# Patient Record
Sex: Male | Born: 1950 | Race: White | Hispanic: No | Marital: Married | Smoking: Never smoker
Health system: Southern US, Community
[De-identification: ages and names within clinical notes are randomized; demographics above are authoritative.]

## PROBLEM LIST (undated history)

## (undated) DIAGNOSIS — T148XXA Other injury of unspecified body region, initial encounter: Secondary | ICD-10-CM

## (undated) DIAGNOSIS — E785 Hyperlipidemia, unspecified: Secondary | ICD-10-CM

## (undated) DIAGNOSIS — T7840XA Allergy, unspecified, initial encounter: Secondary | ICD-10-CM

## (undated) DIAGNOSIS — G250 Essential tremor: Secondary | ICD-10-CM

## (undated) DIAGNOSIS — M1990 Unspecified osteoarthritis, unspecified site: Secondary | ICD-10-CM

## (undated) DIAGNOSIS — I10 Essential (primary) hypertension: Secondary | ICD-10-CM

## (undated) DIAGNOSIS — Z1210 Encounter for screening for malignant neoplasm of intestinal tract, unspecified: Secondary | ICD-10-CM

## (undated) DIAGNOSIS — K219 Gastro-esophageal reflux disease without esophagitis: Secondary | ICD-10-CM

## (undated) DIAGNOSIS — E119 Type 2 diabetes mellitus without complications: Secondary | ICD-10-CM

## (undated) HISTORY — DX: Gastro-esophageal reflux disease without esophagitis: K21.9

## (undated) HISTORY — DX: Encounter for screening for malignant neoplasm of intestinal tract, unspecified: Z12.10

## (undated) HISTORY — DX: Hyperlipidemia, unspecified: E78.5

## (undated) HISTORY — DX: Allergy, unspecified, initial encounter: T78.40XA

## (undated) HISTORY — DX: Unspecified osteoarthritis, unspecified site: M19.90

## (undated) HISTORY — DX: Other injury of unspecified body region, initial encounter: T14.8XXA

## (undated) HISTORY — DX: Type 2 diabetes mellitus without complications: E11.9

## (undated) HISTORY — DX: Essential tremor: G25.0

## (undated) HISTORY — DX: Essential (primary) hypertension: I10

---

## 1981-12-29 HISTORY — PX: THYROID CYST EXCISION: SHX2511

## 2005-05-23 ENCOUNTER — Ambulatory Visit: Payer: Self-pay | Admitting: Internal Medicine

## 2005-06-09 ENCOUNTER — Ambulatory Visit: Payer: Self-pay | Admitting: Cardiology

## 2005-07-07 ENCOUNTER — Ambulatory Visit: Payer: Self-pay | Admitting: Internal Medicine

## 2005-07-14 ENCOUNTER — Ambulatory Visit: Payer: Self-pay | Admitting: Internal Medicine

## 2005-09-29 ENCOUNTER — Ambulatory Visit: Payer: Self-pay | Admitting: Internal Medicine

## 2005-10-03 ENCOUNTER — Ambulatory Visit: Payer: Self-pay | Admitting: Internal Medicine

## 2005-11-07 ENCOUNTER — Ambulatory Visit: Payer: Self-pay | Admitting: Internal Medicine

## 2006-01-01 ENCOUNTER — Ambulatory Visit: Payer: Self-pay | Admitting: Internal Medicine

## 2006-02-19 ENCOUNTER — Ambulatory Visit: Payer: Self-pay | Admitting: Internal Medicine

## 2006-12-16 ENCOUNTER — Ambulatory Visit: Payer: Self-pay | Admitting: Internal Medicine

## 2007-01-06 ENCOUNTER — Ambulatory Visit: Payer: Self-pay | Admitting: Internal Medicine

## 2007-01-06 LAB — CONVERTED CEMR LAB
Albumin: 3.3 g/dL — ABNORMAL LOW (ref 3.5–5.2)
CO2: 29 meq/L (ref 19–32)
Cholesterol: 144 mg/dL (ref 0–200)
GFR calc non Af Amer: 73 mL/min
Glomerular Filtration Rate, Af Am: 89 mL/min/{1.73_m2}
Glucose, Bld: 0 mg/dL — CL (ref 70–99)
HDL: 30.4 mg/dL — ABNORMAL LOW (ref >39.0)
LDL Cholesterol: 84 mg/dL (ref 0–99)
Total Bilirubin: 0.9 mg/dL (ref 0.3–1.2)
Triglyceride fasting, serum: 146 mg/dL (ref 0–149)
VLDL: 29 mg/dL (ref 0–40)

## 2007-01-07 ENCOUNTER — Encounter: Payer: Self-pay | Admitting: Internal Medicine

## 2007-01-07 LAB — CONVERTED CEMR LAB: Glucose, Bld: 157 mg/dL — ABNORMAL HIGH (ref 70–99)

## 2007-01-13 ENCOUNTER — Ambulatory Visit: Payer: Self-pay | Admitting: Internal Medicine

## 2007-04-14 ENCOUNTER — Ambulatory Visit: Payer: Self-pay | Admitting: Internal Medicine

## 2007-04-14 LAB — CONVERTED CEMR LAB
Hgb A1c MFr Bld: 7.1 % — ABNORMAL HIGH (ref 4.6–6.0)
LDL Cholesterol: 105 mg/dL — ABNORMAL HIGH (ref 0–99)
Total CHOL/HDL Ratio: 5.2
VLDL: 23 mg/dL (ref 0–40)

## 2007-04-28 ENCOUNTER — Ambulatory Visit: Payer: Self-pay | Admitting: Internal Medicine

## 2007-09-27 ENCOUNTER — Encounter: Payer: Self-pay | Admitting: *Deleted

## 2007-09-27 DIAGNOSIS — E118 Type 2 diabetes mellitus with unspecified complications: Secondary | ICD-10-CM | POA: Insufficient documentation

## 2007-09-27 DIAGNOSIS — I10 Essential (primary) hypertension: Secondary | ICD-10-CM | POA: Insufficient documentation

## 2008-02-18 ENCOUNTER — Telehealth (INDEPENDENT_AMBULATORY_CARE_PROVIDER_SITE_OTHER): Payer: Self-pay | Admitting: *Deleted

## 2008-03-06 ENCOUNTER — Ambulatory Visit: Payer: Self-pay | Admitting: Internal Medicine

## 2008-03-06 ENCOUNTER — Encounter (INDEPENDENT_AMBULATORY_CARE_PROVIDER_SITE_OTHER): Payer: Self-pay | Admitting: *Deleted

## 2008-03-06 DIAGNOSIS — E1169 Type 2 diabetes mellitus with other specified complication: Secondary | ICD-10-CM | POA: Insufficient documentation

## 2008-03-06 DIAGNOSIS — E785 Hyperlipidemia, unspecified: Secondary | ICD-10-CM

## 2008-03-06 LAB — CONVERTED CEMR LAB
ALT: 27 units/L (ref 0–53)
AST: 18 units/L (ref 0–37)
Albumin: 3.6 g/dL (ref 3.5–5.2)
Bilirubin, Direct: 0.1 mg/dL (ref 0.0–0.3)
Calcium: 9.7 mg/dL (ref 8.4–10.5)
Chloride: 104 meq/L (ref 96–112)
GFR calc Af Amer: 99 mL/min
GFR calc non Af Amer: 82 mL/min
Glucose, Bld: 182 mg/dL — ABNORMAL HIGH (ref 70–99)
HDL: 26.2 mg/dL — ABNORMAL LOW (ref >39.0)
Total CHOL/HDL Ratio: 5.4
VLDL: 47 mg/dL — ABNORMAL HIGH (ref 0–40)

## 2008-03-08 ENCOUNTER — Encounter: Payer: Self-pay | Admitting: Internal Medicine

## 2008-03-08 DIAGNOSIS — J309 Allergic rhinitis, unspecified: Secondary | ICD-10-CM | POA: Insufficient documentation

## 2008-03-24 ENCOUNTER — Ambulatory Visit: Payer: Self-pay | Admitting: Gastroenterology

## 2008-04-24 ENCOUNTER — Ambulatory Visit: Payer: Self-pay | Admitting: Gastroenterology

## 2008-04-24 ENCOUNTER — Encounter: Payer: Self-pay | Admitting: Gastroenterology

## 2008-04-24 ENCOUNTER — Encounter: Payer: Self-pay | Admitting: Internal Medicine

## 2008-04-25 ENCOUNTER — Encounter: Payer: Self-pay | Admitting: Gastroenterology

## 2008-07-27 ENCOUNTER — Telehealth: Payer: Self-pay | Admitting: Internal Medicine

## 2008-08-15 ENCOUNTER — Encounter: Payer: Self-pay | Admitting: Internal Medicine

## 2009-05-14 ENCOUNTER — Ambulatory Visit: Payer: Self-pay | Admitting: Gastroenterology

## 2009-06-01 ENCOUNTER — Ambulatory Visit: Payer: Self-pay | Admitting: Gastroenterology

## 2009-07-30 ENCOUNTER — Telehealth: Payer: Self-pay | Admitting: Internal Medicine

## 2009-10-16 ENCOUNTER — Telehealth: Payer: Self-pay | Admitting: Internal Medicine

## 2009-11-21 ENCOUNTER — Telehealth: Payer: Self-pay | Admitting: Internal Medicine

## 2009-12-25 ENCOUNTER — Telehealth: Payer: Self-pay | Admitting: Internal Medicine

## 2010-01-01 ENCOUNTER — Telehealth: Payer: Self-pay | Admitting: Internal Medicine

## 2010-01-09 ENCOUNTER — Telehealth: Payer: Self-pay | Admitting: Internal Medicine

## 2010-02-12 ENCOUNTER — Telehealth: Payer: Self-pay | Admitting: Internal Medicine

## 2010-03-21 ENCOUNTER — Ambulatory Visit: Payer: Self-pay | Admitting: Internal Medicine

## 2010-03-21 LAB — CONVERTED CEMR LAB
Albumin: 3.6 g/dL (ref 3.5–5.2)
Alkaline Phosphatase: 79 units/L (ref 39–117)
Basophils Absolute: 0 10*3/uL (ref 0.0–0.1)
Basophils Relative: 0.4 % (ref 0.0–3.0)
Bilirubin Urine: NEGATIVE
Calcium: 9.4 mg/dL (ref 8.4–10.5)
Chloride: 103 meq/L (ref 96–112)
Eosinophils Absolute: 0.3 10*3/uL (ref 0.0–0.7)
HCT: 43.3 % (ref 39.0–52.0)
Hemoglobin: 14 g/dL (ref 13.0–17.0)
Ketones, ur: NEGATIVE mg/dL
LDL Cholesterol: 85 mg/dL (ref 0–99)
Leukocytes, UA: NEGATIVE
Lymphocytes Relative: 22.6 % (ref 12.0–46.0)
Lymphs Abs: 1.4 10*3/uL (ref 0.7–4.0)
Monocytes Relative: 6.9 % (ref 3.0–12.0)
Neutro Abs: 3.9 10*3/uL (ref 1.4–7.7)
Neutrophils Relative %: 64.8 % (ref 43.0–77.0)
Nitrite: NEGATIVE
Platelets: 152 10*3/uL (ref 150.0–400.0)
RBC: 5.22 M/uL (ref 4.22–5.81)
TSH: 2.48 microintl units/mL (ref 0.35–5.50)
Total Bilirubin: 0.6 mg/dL (ref 0.3–1.2)

## 2010-03-26 ENCOUNTER — Ambulatory Visit: Payer: Self-pay | Admitting: Internal Medicine

## 2010-03-26 LAB — HM DIABETES FOOT EXAM

## 2010-04-09 ENCOUNTER — Encounter: Payer: Self-pay | Admitting: Internal Medicine

## 2010-04-11 ENCOUNTER — Telehealth: Payer: Self-pay | Admitting: Internal Medicine

## 2010-09-18 ENCOUNTER — Telehealth (INDEPENDENT_AMBULATORY_CARE_PROVIDER_SITE_OTHER): Payer: Self-pay

## 2010-09-24 ENCOUNTER — Telehealth (INDEPENDENT_AMBULATORY_CARE_PROVIDER_SITE_OTHER): Payer: Self-pay | Admitting: *Deleted

## 2011-01-30 NOTE — Progress Notes (Signed)
Summary: REFILLS  Phone Note Call from Patient Call back at Home Phone [phone]   Summary of Call: Patient left message on triage that he was notified by his pharmacy that our office wil not refill his meds. Patient would like a call to discuss and schedule CPX.  *Note- Crestor was refilled. Initial call taken by: Lucious Groves,  February 12, 2010 11:15 AM  Follow-up for Phone Call        Spoke with pt, last office visit was 02/2008, sent in refills and transferred pt to schedulers for cpx  Follow-up by: Lamar Sprinkles, CMA,  February 12, 2010 11:49 AM    Prescriptions: CRESTOR 5 MG  TABS (ROSUVASTATIN CALCIUM) once daily  #30 x 1   Entered by:   Lamar Sprinkles, CMA   Authorized by:   [name]   Signed by:   Lamar Sprinkles, CMA on 02/12/2010   Method used:   Electronically to        [facility] [phone][facility] (retail)       [address]       [place], [place]  [address]       Ph: [phone] or [phone]       Fax: [phone]   RxID:   [id number] METFORMIN HCL 500 MG  TABS (METFORMIN HCL) two times a day  #60 x 1   Entered by:   Lamar Sprinkles, CMA   Authorized by:   [name]   Signed by:   Lamar Sprinkles, CMA on 02/12/2010   Method used:   Electronically to        [facility] [phone]* (retail)       [address]       [place], [place]  [address]       Ph: [phone] or [phone]       Fax: [phone]   RxID:   [id number]

## 2011-01-30 NOTE — Progress Notes (Signed)
  Phone Note Refill Request Message from:  Fax from Pharmacy on April 11, 2010 10:20 AM  Refills Requested: Medication #1:  CRESTOR 5 MG  TABS once daily   Last Refilled: 03/12/2010 Initial call taken by: Ami Bullins CMA,  April 11, 2010 10:20 AM    Prescriptions: CRESTOR 5 MG  TABS (ROSUVASTATIN CALCIUM) once daily  #30 x 3   Entered by:   Ami Bullins CMA   Authorized by:   [name]   Signed by:   [name] CMA on 04/11/2010   Method used:   Electronically to        [facility] [phone]* (retail)       [address]       [place], [place]  [address]       Ph: [phone] or [phone]       Fax: [phone]   RxID:   [id number]

## 2011-01-30 NOTE — Progress Notes (Signed)
  Phone Note Other Incoming   Request: Send information Summary of Call: Request for records received from [facility]. Request forwarded to [facility].

## 2011-01-30 NOTE — Progress Notes (Signed)
  Phone Note Other Incoming   Request: Send information Summary of Call: Request received from [facility]. Forwarded to [facility].    

## 2011-01-30 NOTE — Letter (Signed)
Summary: Ashok Cordia OD  Ashok Cordia OD   Imported By: Sherian Rein 04/25/2010 10:41:46  _____________________________________________________________________  External Attachment:    Type:   Image     Comment:   External Document

## 2011-01-30 NOTE — Assessment & Plan Note (Signed)
Summary: PHYSICAL--STC   Vital Signs:  Patient profile:   60 year old male Height:      69 inches (175.26 cm) Weight:      193 pounds (87.73 kg) BMI:     28.60 O2 Sat:      98 % on Room air Temp:     98.3 degrees F oral Pulse rate:   71 / minute BP sitting:   132 / 88  (left arm) Cuff size:   regular  Vitals Entered By: [name] (2010/04/16 2:35 PM)  O2 Flow:  Room air CC: CPX/aj  Vision Screening:      Vision Comments: 08/2009 with Dr. [name]  Vision Entered By: [name] CMA (Apr 16, 2010 2:39 PM) [name]   Primary Care Provider:  [name]  CC:  CPX/aj.  History of Present Illness: Patient presents for CPX. Last exam in Feb '09. IN the interval he has had colonoscopy with a large adenomatous polyp.  He had a repeat in June '10 which was negative.   He has not had any monitoring of his  blood sugar except at colonoscopy the CBG was elevated. He has not felt like he has had any lows but he does complain of low energy and lack of zest which may be due to chronic hyperglycemia  In the interval he has not had any major illness, surgery or injury.   He does complain of left foot pain.  Current Medications (verified): 1)  Zestril 20 Mg  Tabs (Lisinopril) .... Once Daily 2)  Crestor 5 Mg  Tabs (Rosuvastatin Calcium) .... Once Daily 3)  Metformin Hcl 500 Mg  Tabs (Metformin Hcl) .... Two Times A Day  Allergies (verified): No Known Drug Allergies  Past History:  Past Medical History: Last updated: 03/06/2008  OTHER AND UNSPECIFIED HYPERLIPIDEMIA (ICD-272.4) SPECIAL SCREENING MALIG NEOPLSM INTESTINE UNSPEC (ICD-V76.50) HYPERTENSION (ICD-401.9) DIABETES MELLITUS, TYPE II (ICD-250.00) Allergic rhinitis FAMILIAL TREMOR CALCIUM DEPOSITS BILATERAL SHOULDERS FRACTURE RIGHT THUMB, n/o FRACTURE RIGHT ELBOW,h/o  Family History: Last updated: 04-16-10 father - deceased @ 66: CAD/MI-fatal, familial tremor, DM mother  -deceased @ 23 breast  cancer 1/2 sister with breast cancer, died of CAD/MI 1/2 brother with CAD/CABG Neg- colon, prostate cancer;   Social History: Last updated: 03/06/2008 [place] - '89 twins/boy & girl - '92 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  Risk Factors: Smoking Status: never (09/27/2007)  Past Surgical History: Thyroid ductal cyst removal '83  Family History: father - deceased @ 42: CAD/MI-fatal, familial tremor, DM mother  -deceased @ 32 breast cancer 1/2 sister with breast cancer, died of CAD/MI 1/2 brother with CAD/CABG Neg- colon, prostate cancer;   Review of Systems  The patient denies anorexia, fever, weight loss, weight gain, vision loss, hoarseness, chest pain, dyspnea on exertion, peripheral edema, prolonged cough, hemoptysis, abdominal pain, hematochezia, hematuria, muscle weakness, suspicious skin lesions, difficulty walking, unusual weight change, abnormal bleeding, and angioedema.    Physical Exam  General:  Well-developed,well-nourished,in no acute distress; alert,appropriate and cooperative throughout examination Head:  Normocephalic and atraumatic without obvious abnormalities. No apparent alopecia or balding. Eyes:  pupils equal and pupils round.   Ears:  R ear normal and L ear normal.   Nose:  no external deformity and no external erythema.   Mouth:  Oral mucosa and oropharynx without lesions or exudates.  Teeth in good repair. Neck:  No deformities, masses, or tenderness noted. Chest Wall:  no deformities and no tenderness.   Lungs:  Normal respiratory effort, chest expands symmetrically. Lungs are clear to auscultation, no crackles or wheezes. Heart:  Normal rate and regular rhythm. S1 and S2 normal without gallop, murmur, click, rub or other extra sounds. Abdomen:  soft, normal bowel sounds, no distention, no masses, no guarding, no inguinal hernia, and no hepatomegaly.   Rectal:  No external abnormalities noted. Normal sphincter tone. No  rectal masses or tenderness. Genitalia:  Testes bilaterally descended without nodularity, tenderness or masses. No scrotal masses or lesions. No penis lesions or urethral discharge. Prostate:  Prostate gland firm and smooth, no enlargement, nodularity, tenderness, mass, asymmetry or induration. Msk:  normal ROM, no joint tenderness, no joint swelling, no joint warmth, no joint deformities, and no joint instability.   Pulses:  2+ radial and DP pusles Extremities:  No clubbing, cyanosis, edema, or deformity noted with normal full range of motion of all joints.   Neurologic:  alert & oriented X3, cranial nerves II-XII intact, gait normal, and DTRs symmetrical and normal.   Skin:  turgor normal, color normal, no ulcerations, and no edema.   Cervical Nodes:  no anterior cervical adenopathy and no posterior cervical adenopathy.   Psych:  Oriented X3, memory intact for recent and remote, normally interactive, and good eye contact.    Diabetes Management Exam:    Foot Exam (with socks and/or shoes not present):       Sensory-Pinprick/Light touch:          Left medial foot (L-4): normal          Left dorsal foot (L-5): normal          Left lateral foot (S-1): normal          Right medial foot (L-4): normal          Right dorsal foot (L-5): normal          Right lateral foot (S-1): normal       Sensory-Monofilament:          Left foot: normal          Right foot: normal       Sensory-other: decreased deep vibratory sensation       Inspection:          Left foot: normal          Right foot: normal       Nails:          Left foot: normal          Right foot: normal    Eye Exam:       Eye Exam done elsewhere          Date: 09/13/2008          Results: normal          Done by: september '09   Impression & Recommendations:  Problem # 1:  ALLERGIC RHINITIS (ICD-477.9) Having some symptoms as the season progresses.  Plan - continue allegra (soon to go otc)  The following medications were  removed from the medication list:    Allegra 180 Mg Tabs (Fexofenadine hcl) .Marland Kitchen... As needed  Problem # 2:  OTHER AND UNSPECIFIED HYPERLIPIDEMIA (ICD-272.4)  His updated medication list for this problem includes:    Crestor 5 Mg Tabs (Rosuvastatin calcium) ..... Once daily  Labs Reviewed: SGOT: 18 (03/21/2010)   SGPT: 32 (03/21/2010)   HDL:36.10 (03/21/2010), 26.2 (03/06/2008)  LDL:85 (03/21/2010), DEL (72/53/6644)  Chol:156 (03/21/2010), 141 (03/06/2008)  Trig:176.0 (03/21/2010), 233 (03/06/2008)  Excellent control on crestor  5mg  once daily. Plan - continue present medication  Problem # 3:  HYPERTENSION (ICD-401.9)  His updated medication list for this problem includes:    Zestril 20 Mg Tabs (Lisinopril) ..... Once daily  BP today: 132/88 Prior BP: 137/89 (03/06/2008)  Labs Reviewed: K+: 4.3 (03/21/2010)  Borderline control. Plan - continue present medications. Better life-style managment with exericse and weight loss  Problem # 4:  DIABETES MELLITUS, TYPE II (ICD-250.00) A1C 9.3% - poor control.  Plan - increase metformin to 100mg  two times a day           close attention to no sugar and low carb diet. Aerobic exercise at least 3 times a week.           Repeat A1C in 3 months  His updated medication list for this problem includes:    Zestril 20 Mg Tabs (Lisinopril) ..... Once daily    Metformin Hcl 1000 Mg Tabs (Metformin hcl) .Marland Kitchen... 1 by mouth two times a day  Problem # 5:  Preventive Health Care (ICD-V70.0) Unremarkable history, Exam is essentially normal except for minimal loss of deep vibratory sensation feet. Up to date colorectal and prostate cancer screening. Immunizations up to date.  In summary - a nice man who has poorly controlled diabetes. He needs to be seen every 6 months and will need a repeat A1C in 3 months.   Complete Medication List: 1)  Zestril 20 Mg Tabs (Lisinopril) .... Once daily 2)  Crestor 5 Mg Tabs (Rosuvastatin calcium) .... Once daily 3)   Metformin Hcl 1000 Mg Tabs (Metformin hcl) .Marland Kitchen.. 1 by mouth two times a day  Patient: Nathan Frazier Note: All result statuses are Final unless otherwise noted.  Tests: (1) BMP (METABOL)   Sodium                    140 mEq/L                   135-145   Potassium                 4.3 mEq/L                   3.5-5.1   Chloride                  103 mEq/L                   96-112   Carbon Dioxide            30 mEq/L                    19-32   Glucose              [H]  218 mg/dL                   16-10   BUN                       12 mg/dL                    9-60   Creatinine                0.9 mg/dL                   4.5-4.0   Calcium  9.4 mg/dL                   4.6-27.0   GFR                       91.18 mL/min                >60  Tests: (2) CBC Platelet w/Diff (CBCD)   White Cell Count          6.0 K/uL                    4.5-10.5   Red Cell Count            5.22 Mil/uL                 4.22-5.81   Hemoglobin                14.0 g/dL                   35.0-09.3   Hematocrit                43.3 %                      39.0-52.0   MCV                       83.0 fl                     78.0-100.0   MCHC                      32.3 g/dL                   81.8-29.9   RDW                  [H]  15.5 %                      11.5-14.6   Platelet Count            152.0 K/uL                  150.0-400.0   Neutrophil %              64.8 %                      43.0-77.0   Lymphocyte %              22.6 %                      12.0-46.0   Monocyte %                6.9 %                       3.0-12.0   Eosinophils%         [H]  5.3 %                       0.0-5.0   Basophils %               0.4 %  0.0-3.0   Neutrophill Absolute      3.9 K/uL                    1.4-7.7   Lymphocyte Absolute       1.4 K/uL                    0.7-4.0   Monocyte Absolute         0.4 K/uL                    0.1-1.0  Eosinophils, Absolute                             0.3 K/uL                     0.0-0.7   Basophils Absolute        0.0 K/uL                    0.0-0.1  Tests: (3) Hepatic/Liver Function Panel (HEPATIC)   Total Bilirubin           0.6 mg/dL                   0.4-5.4   Direct Bilirubin          0.1 mg/dL                   0.9-8.1   Alkaline Phosphatase      79 U/L                      39-117   AST                       18 U/L                      0-37   ALT                       32 U/L                      0-53   Total Protein             7.1 g/dL                    1.9-1.4   Albumin                   3.6 g/dL                    7.8-2.9  Tests: (4) TSH (TSH)   FastTSH                   2.48 uIU/mL                 0.35-5.50  Tests: (5) Lipid Panel (LIPID)   Cholesterol               156 mg/dL                   5-621     ATP III Classification            Desirable:  < 200 mg/dL  Borderline High:  200 - 239 mg/dL               High:  > = 240 mg/dL   Triglycerides        [H]  176.0 mg/dL                 0.4-540.9     Normal:  <150 mg/dL     Borderline High:  811 - 199 mg/dL   HDL                  [L]  91.47 mg/dL                 >82.95   VLDL Cholesterol          35.2 mg/dL                  6.2-13.0   LDL Cholesterol           85 mg/dL                    8-65  CHO/HDL Ratio:  CHD Risk                             4                    Men          Women     1/2 Average Risk     3.4          3.3     Average Risk          5.0          4.4     2X Average Risk          9.6          7.1     3X Average Risk          15.0          11.0                           Tests: (6) Prostate Specific Antigen (PSA)   PSA-Hyb                   1.58 ng/mL                  0.10-4.00  Tests: (7) UDip w/Micro (URINE)   Color                     YELLOW       RANGE:  Yellow;Lt. Yellow   Clarity                   CLEAR                       Clear   Specific Gravity          >=1.030                     1.000 - 1.030   Urine Ph                  5.0                          5.0-8.0   Protein  100                         Negative   Urine Glucose             250                         Negative   Ketones                   NEGATIVE                    Negative   Urine Bilirubin           NEGATIVE                    Negative   Blood                     TRACE-INTACT                Negative   Urobilinogen              0.2                         0.0 - 1.0   Leukocyte Esterace        NEGATIVE                    Negative   Nitrite                   NEGATIVE                    Negative   Tests: (1) Hemoglobin A1C (A1C)   Hemoglobin A1C       [H]  9.3 %                       4.6-6.5     Glycemic Control Guidelines for People with Diabetes:     Non Diabetic:  <6%     Goal of Therapy: <7%     Additional Action Suggested:  >8% Prescriptions: METFORMIN HCL 1000 MG TABS (METFORMIN HCL) 1 by mouth two times a day  #60 x 12   Entered and Authorized by:   [name]   Signed by:   [name] on 03/27/2010   Method used:   Electronically to        [facility] [phone]* (retail)       [address]       [place], [place]  [address]       Ph: [phone] or [phone]       Fax: [phone]   RxID:   [id number]

## 2011-01-30 NOTE — Progress Notes (Signed)
  Phone Note Refill Request Message from:  Fax from Pharmacy  Refills Requested: Medication #1:  CRESTOR 5 MG  TABS once daily Initial call taken by: Ami Bullins CMA,  January 01, 2010 10:45 AM    Prescriptions: CRESTOR 5 MG  TABS (ROSUVASTATIN CALCIUM) once daily  #30 Tablet x 0   Entered by:   Ami Bullins CMA   Authorized by:   [name]   Signed by:   [name] CMA on 01/01/2010   Method used:   Electronically to        [facility] [phone]* (retail)       [address]       [place], [place]  [address]       Ph: [phone] or [phone]       Fax: [phone]   RxID:   [id number]

## 2011-01-30 NOTE — Progress Notes (Signed)
  Phone Note Refill Request Message from:  Fax from Pharmacy on January 09, 2010 2:21 PM  Refills Requested: Medication #1:  METFORMIN HCL 500 MG  TABS two times a day Initial call taken by: Ami Bullins CMA,  January 09, 2010 2:21 PM    Prescriptions: METFORMIN HCL 500 MG  TABS (METFORMIN HCL) two times a day  #60 Tablet x 2   Entered by:   Ami Bullins CMA   Authorized by:   [name]   Signed by:   [name] CMA on 01/09/2010   Method used:   Electronically to        [facility] [phone]* (retail)       [address]       [place], [place]  [address]       Ph: [phone] or [phone]       Fax: [phone]   RxID:   [id number]

## 2011-04-03 ENCOUNTER — Other Ambulatory Visit: Payer: Self-pay | Admitting: Internal Medicine

## 2011-04-07 LAB — GLUCOSE, CAPILLARY
Glucose-Capillary: 163 mg/dL — ABNORMAL HIGH (ref 70–99)
Glucose-Capillary: 183 mg/dL — ABNORMAL HIGH (ref 70–99)

## 2011-04-16 ENCOUNTER — Other Ambulatory Visit: Payer: Self-pay | Admitting: Internal Medicine

## 2011-05-13 NOTE — Assessment & Plan Note (Signed)
[facility]                         GASTROENTEROLOGY OFFICE NOTE   NAME:Frazier, Nathan Frazier                       MRN:          [id number]  DATE:03/24/2008                            DOB:          1951-07-12    REFERRING PHYSICIAN:  Rosalyn Gess. Norins, MD   REASON FOR REFERRAL:  Dr. Debby Bud asked me to evaluate Nathan Frazier in  consultation regarding screening colonoscopy.   HPI:  Nathan Frazier is a very pleasant 60 year old man, who had a flexible  sigmoidoscopy five or six years ago by Dr. Debby Bud and he tells me that  it was normal.  He really does not have any problems with his bowels,  although he did admit that, for about a year after his flexible  sigmoidoscopy, he was relatively a bit more constipated than usual.  He  sees no blood.  He has no overt bleeding, no significant abdominal  pains, no nausea or vomiting.   REVIEW OF SYSTEMS:  Notable for stable weight and is otherwise  essentially normal and is available on his nursing intake sheet.   PAST MEDICAL HISTORY:  Borderline diabetes, on oral meds for the past  six months.  Hypertension.  Thyroid cyst removal.   CURRENT MEDICATIONS:  1. Lisinopril 20 mg once daily.  2. Metformin 500 mg twice daily.  3. Crestor 5 mg once daily.   ALLERGIES:  No known drug allergies.   SOCIAL HISTORY:  Married.  Lives by himself.  He has a second house in  [place], where his wife and daughters live and he stays with them on  the weekends.  He is in Therapist, sports.  Nonsmoker.  Drinks alcohol  sparingly.   FAMILY HISTORY:  Father with diabetes.  Mother with lung cancer.  Sister  with breast cancer.  Father and brother with heart disease.  No colon  cancer or colon polyps in family.   PHYSICAL EXAM:  Patient is 5 feet 8 inches, 197 pounds.  Blood pressure  124/74, pulse 64.  CONSTITUTIONAL:  Generally well-appearing.  NEUROLOGIC:  Alert and oriented times three.  EYES:  Extraocular movements intact.  MOUTH:  Oropharynx moist, no lesions.  NECK:  Supple, no lymphadenopathy.  CARDIOVASCULAR/HEART:  Regular rate and rhythm.  LUNGS:  Clear to auscultation bilaterally.  ABDOMEN:  Soft, nontender, nondistended.  Normal bowel sounds.  EXTREMITIES:  No lower extremity edema.  SKIN:  No rashes or lesions on visible extremities.   ASSESSMENT AND PLAN:  Patient is a 60 year old man at routine risk for  colon cancer.   I did not mention above, but he has had recent blood testing, showing an  essentially normal CBC, normal complete metabolic profile.  Hemoglobin  A1c was 7.3.  He is at routine risk for colorectal cancer and I think it  is reasonable we proceed with colonoscopy at his soonest convenience.  I  see no reason for any further blood tests or imaging studies prior to  then.     Rachael Fee, MD  Electronically Signed    DPJ/MedQ  DD: 03/24/2008  DT: 03/24/2008  Job #: [id number]  cc:   Rosalyn Gess. Norins, MD

## 2011-07-09 ENCOUNTER — Other Ambulatory Visit: Payer: Self-pay | Admitting: Internal Medicine

## 2011-07-24 ENCOUNTER — Other Ambulatory Visit: Payer: Self-pay | Admitting: Internal Medicine

## 2011-07-24 ENCOUNTER — Other Ambulatory Visit (INDEPENDENT_AMBULATORY_CARE_PROVIDER_SITE_OTHER): Payer: BC Managed Care – PPO

## 2011-07-24 DIAGNOSIS — Z0000 Encounter for general adult medical examination without abnormal findings: Secondary | ICD-10-CM

## 2011-07-24 LAB — CBC WITH DIFFERENTIAL/PLATELET
Basophils Absolute: 0 10*3/uL (ref 0.0–0.1)
Eosinophils Absolute: 0.4 10*3/uL (ref 0.0–0.7)
Eosinophils Relative: 5.4 % — ABNORMAL HIGH (ref 0.0–5.0)
Lymphs Abs: 1.5 10*3/uL (ref 0.7–4.0)
MCV: 82.6 fl (ref 78.0–100.0)
Monocytes Absolute: 0.5 10*3/uL (ref 0.1–1.0)
Neutrophils Relative %: 64.9 % (ref 43.0–77.0)
Platelets: 194 10*3/uL (ref 150.0–400.0)
RDW: 16.4 % — ABNORMAL HIGH (ref 11.5–14.6)
WBC: 6.9 10*3/uL (ref 4.5–10.5)

## 2011-07-24 LAB — BASIC METABOLIC PANEL
Calcium: 9.5 mg/dL (ref 8.4–10.5)
GFR: 78.57 mL/min (ref >60.00)
Glucose, Bld: 205 mg/dL — ABNORMAL HIGH (ref 70–99)
Potassium: 4.7 mEq/L (ref 3.5–5.1)
Sodium: 137 mEq/L (ref 135–145)

## 2011-07-24 LAB — HEPATIC FUNCTION PANEL
AST: 15 U/L (ref 0–37)
Alkaline Phosphatase: 76 U/L (ref 39–117)
Bilirubin, Direct: 0.1 mg/dL (ref 0.0–0.3)
Total Bilirubin: 0.5 mg/dL (ref 0.3–1.2)

## 2011-07-24 LAB — LIPID PANEL
HDL: 37.3 mg/dL — ABNORMAL LOW (ref >39.00)
Total CHOL/HDL Ratio: 6
Triglycerides: 295 mg/dL — ABNORMAL HIGH (ref 0.0–149.0)
VLDL: 59 mg/dL — ABNORMAL HIGH (ref 0.0–40.0)

## 2011-07-24 LAB — LDL CHOLESTEROL, DIRECT: Direct LDL: 139.7 mg/dL

## 2011-07-24 LAB — PSA: PSA: 2.67 ng/mL (ref 0.10–4.00)

## 2011-07-28 ENCOUNTER — Encounter: Payer: Self-pay | Admitting: Internal Medicine

## 2011-07-29 ENCOUNTER — Ambulatory Visit (INDEPENDENT_AMBULATORY_CARE_PROVIDER_SITE_OTHER): Payer: BC Managed Care – PPO | Admitting: Internal Medicine

## 2011-07-29 VITALS — BP 138/90 | HR 84 | Temp 98.3°F | Wt 194.0 lb

## 2011-07-29 DIAGNOSIS — E785 Hyperlipidemia, unspecified: Secondary | ICD-10-CM

## 2011-07-29 DIAGNOSIS — R4789 Other speech disturbances: Secondary | ICD-10-CM

## 2011-07-29 DIAGNOSIS — E119 Type 2 diabetes mellitus without complications: Secondary | ICD-10-CM

## 2011-07-29 DIAGNOSIS — I10 Essential (primary) hypertension: Secondary | ICD-10-CM

## 2011-07-29 DIAGNOSIS — R4781 Slurred speech: Secondary | ICD-10-CM

## 2011-07-29 MED ORDER — COLESEVELAM HCL 3.75 G PO PACK: 3.7500 g | PACK | ORAL | Status: DC

## 2011-07-29 NOTE — Patient Instructions (Signed)
Lipids - LDL was 139 up from 85 last year. Take the welchol daily. Return in 6 weeks, September 11th for lab follow up. Diabetes- glucose was 200. A1C is pending. If it comes back greater than 7% will need to add a second agent. Slurred speech - pretty high risk for stroke. Plan - increase aspirin to 325mg  daily, will schedule an examination of the carotid arteries - you will be called. Weight management - THE BEST MEDICINE - smart food choices ( no sugar, low carb), PORTION SIZE control ( use the hand as your guide) and regular exercise. Target weight is 175, GOAL is to loose 1 lb per month.

## 2011-07-29 NOTE — Progress Notes (Signed)
Subjective:    Patient ID: Nathan Frazier, male    DOB: 1951/11/15, 60 y.o.   MRN: [id number]  HPI Nathan Frazier returns for follow-up. He was supposed to return sooner. He has had a flare of back pain that with trial and error determined it was the crestor was culprit. He is currently not taking any medication. He has also not been completely adherent to a diabetic diet. He had not returned for follow-up lab as directed. He has had no symptoms: polyuria, polydipsia or polyphagia. No weight gain noted.   He does report that he had a very tranisent episode of expressive aphasia. This cleared completely and has not recurred. We did discuss his multiple risk factors for stroke: HTN, DM, high cholesterol.  Past Medical History  Diagnosis Date  . Other and unspecified hyperlipidemia   . Special screening for malignant neoplasms, unspecified intestine   . Unspecified essential hypertension   . Type II or unspecified type diabetes mellitus without mention of complication, not stated as uncontrolled   . Allergic rhinitis   . Familial tremor   . Fracture     right thumb, right elbow   No past surgical history on file. No family history on file. History   Social History  . Marital Status: Married    Spouse Name: bettie Frazier    Number of Children: 2  . Years of Education: N/A   Occupational History  . 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 Smoking status: Not on file  . Smokeless tobacco: Not on file  . Alcohol Use:   . Drug Use:   . Sexually Active:    Other Topics Concern  . Not on file   Social History Narrative   [place]      Review of Systems Review of Systems  Constitutional:  Negative for fever, chills, activity change and unexpected weight change.  HEENT:  Negative for hearing loss, ear pain, congestion, neck stiffness and postnasal drip. Negative for sore throat or swallowing problems. Negative for dental  complaints.   Eyes: Negative for vision loss or change in visual acuity.  Respiratory: Negative for chest tightness and wheezing.   Cardiovascular: Negative for chest pain and palpitation. No decreased exercise tolerance Gastrointestinal: No change in bowel habit. No bloating or gas. No reflux or indigestion Genitourinary: Negative for urgency, frequency, flank pain and difficulty urinating.  Musculoskeletal: Negative for myalgias, back pain, arthralgias and gait problem.  Neurological: Negative for dizziness, tremors, weakness and headaches.  Hematological: Negative for adenopathy.  Psychiatric/Behavioral: Negative for behavioral problems and dysphoric mood.       Objective:   Physical Exam Vitals noted. MIld elevation BP Gen'l - WNWD white man in no distress HEENT - [place]/AT, C&S clear, PERRLA Cor - RRR, no murmur Pul - normal respirations. MSK - normal movement in the exam room, normal gait.   Lab Results  Component Value Date   WBC 6.9 07/24/2011   HGB 14.8 07/24/2011   HCT 44.8 07/24/2011   PLT 194.0 07/24/2011   CHOL 239* 07/24/2011   TRIG 295.0* 07/24/2011   HDL 37.30* 07/24/2011   LDLDIRECT 139.7 07/24/2011   ALT 28 07/24/2011   AST 15 07/24/2011   NA 137 07/24/2011   K 4.7 07/24/2011   CL 100 07/24/2011   CREATININE 1.0 07/24/2011   BUN 15 07/24/2011   CO2 28 07/24/2011   TSH 4.80 07/24/2011   PSA 2.67 07/24/2011   HGBA1C 9.5*  07/24/2011          Assessment & Plan:   No problem-specific assessment & plan notes found for this encounter.

## 2011-07-31 MED ORDER — SAXAGLIPTIN HCL 5 MG PO TABS: 5.0000 mg | ORAL_TABLET | Freq: Every day | ORAL | Status: DC

## 2011-07-31 NOTE — Assessment & Plan Note (Signed)
A1C added to blood in lab - returns at 9.5%  Plan - will add onglyza to regimen - may start with samples           F/u A1C in 3 months

## 2011-07-31 NOTE — Assessment & Plan Note (Signed)
BP Readings from Last 3 Encounters:  07/29/11 138/90  03/26/10 132/88  03/06/08 137/89   Borderline control.  Plan - no change in medication.           Better life style management with exercise and weight loss           With episode of aphasia will start ASA 325 mg daily

## 2011-07-31 NOTE — Assessment & Plan Note (Signed)
LDL at 139 is above goal for a DM. Discussed the importance of good control.  Plan - welchol powder - one packet daily           F/u lab in several weeks.

## 2011-08-01 ENCOUNTER — Other Ambulatory Visit: Payer: Self-pay | Admitting: *Deleted

## 2011-08-01 MED ORDER — COLESEVELAM HCL 3.75 G PO PACK: 3.7500 g | PACK | ORAL | Status: DC

## 2011-08-04 ENCOUNTER — Telehealth: Payer: Self-pay | Admitting: Internal Medicine

## 2011-08-04 NOTE — Telephone Encounter (Signed)
LMOVM for pt to call the office.  ?

## 2011-08-04 NOTE — Telephone Encounter (Signed)
Please call patient to see if he has started onglyza and if he is tolerating it. Remind him to reutrn for lab in 3 months.  Tanks

## 2011-08-05 NOTE — Telephone Encounter (Signed)
Pt left vm - please call him at [phone]

## 2011-08-05 NOTE — Telephone Encounter (Signed)
Spoke with pt and he states that he is tolerating the onglyza pretty well. He has no complaints.

## 2011-08-05 NOTE — Telephone Encounter (Signed)
k

## 2011-08-08 ENCOUNTER — Other Ambulatory Visit: Payer: Self-pay | Admitting: Internal Medicine

## 2011-08-08 DIAGNOSIS — R4789 Other speech disturbances: Secondary | ICD-10-CM

## 2011-08-11 ENCOUNTER — Encounter (INDEPENDENT_AMBULATORY_CARE_PROVIDER_SITE_OTHER): Payer: BC Managed Care – PPO | Admitting: *Deleted

## 2011-08-11 DIAGNOSIS — I6529 Occlusion and stenosis of unspecified carotid artery: Secondary | ICD-10-CM

## 2011-08-11 DIAGNOSIS — R4789 Other speech disturbances: Secondary | ICD-10-CM

## 2011-08-13 ENCOUNTER — Encounter: Payer: Self-pay | Admitting: Internal Medicine

## 2011-08-18 ENCOUNTER — Telehealth: Payer: Self-pay | Admitting: Internal Medicine

## 2011-08-18 ENCOUNTER — Telehealth: Payer: Self-pay | Admitting: *Deleted

## 2011-08-18 NOTE — Telephone Encounter (Signed)
Carotid doppler reveals NO blockages. A good thing

## 2011-08-18 NOTE — Telephone Encounter (Signed)
Pt is taking Welchol. He states he can take it if is going to help but he would like to know if he can take the tablet form instead. He states it tastes like flavored sand and he can tolerate it but thought he would try for pill form.  Please advise

## 2011-08-18 NOTE — Telephone Encounter (Signed)
Pt informed

## 2011-08-19 NOTE — Telephone Encounter (Signed)
OK. That will be welcohol 625 mg tab, 3 tablets bid. #180 refill prn   Needs follow up lab as scheduled

## 2011-08-20 MED ORDER — COLESEVELAM HCL 625 MG PO TABS: 1875.0000 mg | ORAL_TABLET | Freq: Two times a day (BID) | ORAL | Status: DC

## 2011-08-20 NOTE — Telephone Encounter (Signed)
Rx sent to pharmacy, pt informed.  

## 2011-09-09 ENCOUNTER — Other Ambulatory Visit (INDEPENDENT_AMBULATORY_CARE_PROVIDER_SITE_OTHER): Payer: BC Managed Care – PPO

## 2011-09-09 ENCOUNTER — Other Ambulatory Visit: Payer: Self-pay | Admitting: Internal Medicine

## 2011-09-09 DIAGNOSIS — E785 Hyperlipidemia, unspecified: Secondary | ICD-10-CM

## 2011-09-09 LAB — LDL CHOLESTEROL, DIRECT: Direct LDL: 121.7 mg/dL

## 2011-09-09 LAB — LIPID PANEL
Cholesterol: 201 mg/dL — ABNORMAL HIGH (ref 0–200)
Triglycerides: 250 mg/dL — ABNORMAL HIGH (ref 0.0–149.0)

## 2011-09-15 ENCOUNTER — Encounter: Payer: Self-pay | Admitting: Internal Medicine

## 2011-10-25 ENCOUNTER — Other Ambulatory Visit: Payer: Self-pay | Admitting: Internal Medicine

## 2011-10-27 NOTE — Telephone Encounter (Signed)
Done

## 2011-10-29 ENCOUNTER — Other Ambulatory Visit: Payer: Self-pay | Admitting: Internal Medicine

## 2011-11-18 ENCOUNTER — Ambulatory Visit (INDEPENDENT_AMBULATORY_CARE_PROVIDER_SITE_OTHER): Payer: BC Managed Care – PPO | Admitting: Internal Medicine

## 2011-11-18 DIAGNOSIS — E119 Type 2 diabetes mellitus without complications: Secondary | ICD-10-CM

## 2011-11-18 DIAGNOSIS — E785 Hyperlipidemia, unspecified: Secondary | ICD-10-CM

## 2011-11-18 NOTE — Assessment & Plan Note (Signed)
Continue present meds.  Plan - recheck A1C in the next 4-8 weeks

## 2011-11-18 NOTE — Patient Instructions (Signed)
Cholesterol management - will re challenge you with crestor 5mg  every PM. If this works we will recheck lab at 1 month. If this does NOT work, intolerance, will next try Pravachol, a different statin. If that does NOT work will move to another non-statin drug.  Diabetes - we will recheck A1C after we have you on a stable drug regimen for cholesterol.  Keep up the good work with slow, steady weight loss - 1 or 2 lbs per month.

## 2011-11-18 NOTE — Assessment & Plan Note (Signed)
Intolerant of welchol due to HA/Dizzyness and $$$. Reviewed previous treatment trials.  Plan - rechallenge with crestor 5 mg daily. If not tolerated - pravachol 40mg . If not tolerated lopid.  (greater than 50% of 25 min visit spent on education and counseling)

## 2011-11-18 NOTE — Progress Notes (Signed)
  Subjective:    Patient ID: Nathan Frazier, male    DOB: [birth year], 60 y.o.   MRN: [id number]  HPI [name] - price of welchol jumped to $250/mo!! He had been having dizzy spells and headaches that went away when the welchol was stopped for price reasons. Reviewed old records - he did well with crestor but had low back pain.   He has been taking his DM meds  He has been working on weight loss - lost about 7 lbs, gained a few back.  I have reviewed the patient's medical history in detail and updated the computerized patient record.    Review of Systems System review is negative for any constitutional, cardiac, pulmonary, GI or neuro symptoms or complaints other than as described in the HPI.     Objective:   Physical Exam Vitals - noted Gen'l- overwight white man in no distress Cor - RRR Pulm - normal respirations.       Assessment & Plan:

## 2011-12-02 ENCOUNTER — Telehealth: Payer: Self-pay | Admitting: *Deleted

## 2011-12-02 MED ORDER — GLIMEPIRIDE 2 MG PO TABS: 2.0000 mg | ORAL_TABLET | ORAL | Status: DC

## 2011-12-02 NOTE — Telephone Encounter (Signed)
Patient intolerant of crestor. He has failed all statins, could not afford Welchol. Will need to consider lopid.  Daibetes mgt - he cannot afford ONglyza or any other brand name product. Plan - trial of glimiperide 2 mg. He is warned of signs of hypoglycemia. He is encouraged to eat three meals and a snack on schedule.

## 2011-12-02 NOTE — Telephone Encounter (Signed)
Pt states he cannot take Crestor & would like to discuss w/you; also would like you to call him to discuss other issues.

## 2011-12-08 ENCOUNTER — Telehealth: Payer: Self-pay

## 2011-12-08 NOTE — Telephone Encounter (Signed)
Pt called stating that his insurance company will cover Onglyza and Welchol for the next 4 months due to left over money on his account. Pt is requesting to stop Glimepiride for 4 months while taking Welchol and Onglyza. Pt would also need refills for 4 mths, okay to update and refill?

## 2011-12-08 NOTE — Telephone Encounter (Signed)
Ok to update med list with onglyza and welchol. OK for 4 months worth of Rx

## 2011-12-10 MED ORDER — COLESEVELAM HCL 625 MG PO TABS: 625.0000 mg | ORAL_TABLET | Freq: Two times a day (BID) | ORAL | Status: DC

## 2011-12-10 MED ORDER — SAXAGLIPTIN HCL 2.5 MG PO TABS: 2.5000 mg | ORAL_TABLET | Freq: Every day | ORAL | Status: DC

## 2012-01-08 ENCOUNTER — Telehealth: Payer: Self-pay | Admitting: *Deleted

## 2012-01-08 ENCOUNTER — Ambulatory Visit (INDEPENDENT_AMBULATORY_CARE_PROVIDER_SITE_OTHER): Payer: BC Managed Care – PPO | Admitting: Internal Medicine

## 2012-01-08 ENCOUNTER — Telehealth: Payer: Self-pay

## 2012-01-08 VITALS — BP 122/74 | HR 86 | Temp 98.5°F | Wt 190.0 lb

## 2012-01-08 DIAGNOSIS — K921 Melena: Secondary | ICD-10-CM

## 2012-01-08 MED ORDER — COLESEVELAM HCL 625 MG PO TABS: 625.0000 mg | ORAL_TABLET | Freq: Three times a day (TID) | ORAL | Status: DC

## 2012-01-08 NOTE — Telephone Encounter (Signed)
Pt called c/o abd cramps, N&V, diarrhea x 2 days after eating bacon and sausage 01/08 evening. Pt is also complaining of blood with BM, no fever but is unsure if blood is due to frequent BM. Pt would rather not got to ER and is requesting MD advisement.

## 2012-01-08 NOTE — Telephone Encounter (Signed)
Refill request for Welchol 625mg .

## 2012-01-08 NOTE — Progress Notes (Signed)
  Subjective:    Patient ID: Nathan Frazier, male    DOB: [birth year], 61 y.o.   MRN: [id number]  HPI Nathan Frazier presents for hematochezia. He ate sausage which may have been bad Tuesday night and awoke early Weds Am with diarrhea. After multiple BMs he started having bright red blood per rectum, painless. He has felt weak after all the diarrhea. He also has some persistent abdominal soreness.  I have reviewed the patient's medical history in detail and updated the computerized patient record.    Review of Systems System review is negative for any constitutional, cardiac, pulmonary, GI or neuro symptoms or complaints other than as described in the HPI.     Objective:   Physical Exam Filed Vitals:   01/08/12 1310  BP: 122/74  Pulse: 86  Temp: 98.5 F (36.9 C)   Gen'l - WNWD white man in no distress PUlm - normal respirations Cor - RRR Abdomen - BS+, no guarding or rebound, no tenderness  ANSOCOPY Indication - rectal bleeding/hematochezia Consent - after full explanation verbal consent obtained Patient positioned in the right lateral decubitus position Visual exam performed: mild inflammation of the perirectal area, no external hemorrhoidal tags, no lesion Digital exam performed: normal sphincter tone, no masses or palpable abnormalities. Anoscopy- anoscope carefully introduced. Mucosal swabbing above the anoscope performed: positive for frank blood                   With indirect illumination full exam conducted revealing - no tears, no internal hemorrhoids  Patient tolerated the procedure well.    Assessment & Plan:  Hematochezia - blood on swab and no lesion. Reviewed chart - last colonoscopy June 01, 2009 - normal except for diverticular changes. Suspect a minor diverticular bleed vs mild colitis. New colon lesion unlikely with exam within the last two and a half years.  Plan - watchful waiting           For persistent hematochezia will need GI consult for endoscopic  evaluation.

## 2012-01-08 NOTE — Patient Instructions (Signed)
Blood in the stool - on exam there is blood above the scope which suggests either viral gastroenteritis with some inflammation of the colon (colitis) vs diverticular bleed based on the finding of diverticulosis on '10. Your exam of the abdomen was normal- no guarding or tenderness.  Plan - Parke Simmers diet            Tincture of time           If the bleeding continues we will need to have GI see you for a sigmoidoscopy.   Diverticulosis Diverticulosis is a common condition that develops when small pouches (diverticula) form in the wall of the colon. The risk of diverticulosis increases with age. It happens more often in people who eat a low-fiber diet. Most individuals with diverticulosis have no symptoms. Those individuals with symptoms usually experience abdominal pain, constipation, or loose stools (diarrhea). HOME CARE INSTRUCTIONS    Increase the amount of fiber in your diet as directed by your caregiver or dietician. This may reduce symptoms of diverticulosis.     Your caregiver may recommend taking a dietary fiber supplement.     Drink at least 6 to 8 glasses of water each day to prevent constipation.     Try not to strain when you have a bowel movement.     Your caregiver may recommend avoiding nuts and seeds to prevent complications, although this is still an uncertain benefit.     Only take over-the-counter or prescription medicines for pain, discomfort, or fever as directed by your caregiver.  FOODS WITH HIGH FIBER CONTENT INCLUDE:  Fruits. Apple, peach, pear, tangerine, raisins, prunes.     Vegetables. Brussels sprouts, asparagus, broccoli, cabbage, carrot, cauliflower, romaine lettuce, spinach, summer squash, tomato, winter squash, zucchini.     Starchy Vegetables. Baked beans, kidney beans, lima beans, split peas, lentils, potatoes (with skin).     Grains. Whole wheat bread, brown rice, bran flake cereal, plain oatmeal, white rice, shredded wheat, bran muffins.  SEEK IMMEDIATE  MEDICAL CARE IF:    You develop increasing pain or severe bloating.     You have an oral temperature above 102 F (38.9 C), not controlled by medicine.     You develop vomiting or bowel movements that are bloody or black.  Document Released: 09/11/2004 Document Revised: 08/27/2011 Document Reviewed: 05/15/2010 [facility] Patient Information 2012 [place], [place]  .Colitis Colitis is inflammation of the colon. Colitis can be a short-term or long-standing (chronic) illness. Crohn's disease and ulcerative colitis are 2 types of colitis which are chronic. They usually require lifelong treatment. CAUSES   There are many different causes of colitis, including:  Viruses.     Germs (bacteria).     Medicine reactions.  SYMPTOMS    Diarrhea.     Intestinal bleeding.     Pain.    Fever.    Throwing up (vomiting).     Tiredness (fatigue).     Weight loss.     Bowel blockage.  DIAGNOSIS   The diagnosis of colitis is based on examination and stool or blood tests. X-rays, CT scan, and colonoscopy may also be needed. TREATMENT   Treatment may include:  Fluids given through the vein (intravenously).     Bowel rest (nothing to eat or drink for a period of time).     Medicine for pain and diarrhea.     Medicines (antibiotics) that kill germs.     Cortisone medicines.     Surgery.  HOME CARE INSTRUCTIONS  Get plenty of rest.     Drink enough water and fluids to keep your urine clear or pale yellow.     Eat a well-balanced diet.     Call your caregiver for follow-up as recommended.  SEEK IMMEDIATE MEDICAL CARE IF:    You develop chills.     You have an oral temperature above 102 F (38.9 C), not controlled by medicine.     You have extreme weakness, fainting, or dehydration.     You have repeated vomiting.     You develop severe belly (abdominal) pain or are passing bloody or tarry stools.  MAKE SURE YOU:    Understand these instructions.     Will watch your  condition.     Will get help right away if you are not doing well or get worse.  Document Released: 01/22/2005 Document Revised: 08/27/2011 Document Reviewed: 04/19/2010 [facility] Patient Information 2012 [place], [place].

## 2012-01-09 NOTE — Telephone Encounter (Signed)
Pt was worked in for appt 01/06/2011 by MEN

## 2012-01-12 ENCOUNTER — Ambulatory Visit: Payer: BC Managed Care – PPO | Admitting: Internal Medicine

## 2012-02-02 ENCOUNTER — Ambulatory Visit (INDEPENDENT_AMBULATORY_CARE_PROVIDER_SITE_OTHER): Payer: BC Managed Care – PPO | Admitting: Internal Medicine

## 2012-02-02 ENCOUNTER — Telehealth: Payer: Self-pay

## 2012-02-02 VITALS — BP 130/80 | HR 76 | Temp 97.8°F | Ht 68.0 in | Wt 194.0 lb

## 2012-02-02 DIAGNOSIS — L03019 Cellulitis of unspecified finger: Secondary | ICD-10-CM

## 2012-02-02 DIAGNOSIS — L03012 Cellulitis of left finger: Secondary | ICD-10-CM

## 2012-02-02 MED ORDER — DOXYCYCLINE HYCLATE 100 MG PO TABS: 100.0000 mg | ORAL_TABLET | Freq: Two times a day (BID) | ORAL | Status: AC

## 2012-02-02 NOTE — Progress Notes (Signed)
  Subjective:    Patient ID: Nathan Frazier, male    DOB: November 25, 1951, 62 y.o.   MRN: [id number]  HPI Nathan Frazier presents for an acute paronychia left thumb. This has been developing over the past week. This AM there was spontaneous drainage with relief of pain. The tumb remains warm and red.   I have reviewed the patient's medical history in detail and updated the computerized patient record.    Review of Systems System review is negative for any constitutional, cardiac, pulmonary, GI or neuro symptoms or complaints other than as described in the HPI.      Objective:   Physical Exam Filed Vitals:   02/02/12 1506  BP: 130/80  Pulse: 76  Temp: 97.8 F (36.6 C)   WNWD white man in no distress Ext - left thumb with erythema at the nail base, no frank pus, not tender.        Assessment & Plan:  Paronychia - spontaneously drained but with residual erythema at the nail bed and distal thumb Plan - doxycycline 100 mg bid x 10

## 2012-02-02 NOTE — Patient Instructions (Signed)
Paronychia - infected finger at the nail bed. Spontaneously drained. Plan - warm soaks at least twice a day. Take doxycycline twice a day for 7 days.   Paronychia Paronychia is an inflammatory reaction involving the folds of the skin surrounding the fingernail. This is commonly caused by an infection in the skin around a nail. The most common cause of paronychia is frequent wetting of the hands (as seen with bartenders, food servers, nurses or others who wet their hands). This makes the skin around the fingernail susceptible to infection by bacteria (germs) or fungus. Other predisposing factors are:  Aggressive manicuring.     Nail biting.     Thumb sucking.  The most common cause is a staphylococcal (a type of germ) infection, or a fungal (Candida) infection. When caused by a germ, it usually comes on suddenly with redness, swelling, pus and is often painful. It may get under the nail and form an abscess (collection of pus), or form an abscess around the nail. If the nail itself is infected with a fungus, the treatment is usually prolonged and may require oral medicine for up to one year. Your caregiver will determine the length of time treatment is required. The paronychia caused by bacteria (germs) may largely be avoided by not pulling on hangnails or picking at cuticles. When the infection occurs at the tips of the finger it is called felon. When the cause of paronychia is from the herpes simplex virus (HSV) it is called herpetic whitlow. TREATMENT   When an abscess is present treatment is often incision and drainage. This means that the abscess must be cut open so the pus can get out. When this is done, the following home care instructions should be followed. HOME CARE INSTRUCTIONS    It is important to keep the affected fingers very dry. Rubber or plastic gloves over cotton gloves should be used whenever the hand must be placed in water.     Keep wound clean, dry and dressed as suggested by  your caregiver between warm soaks or warm compresses.     Soak in warm water for fifteen to twenty minutes three to four times per day for bacterial infections. Fungal infections are very difficult to treat, so often require treatment for long periods of time.     For bacterial (germ) infections take antibiotics (medicine which kill germs) as directed and finish the prescription, even if the problem appears to be solved before the medicine is gone.     Only take over-the-counter or prescription medicines for pain, discomfort, or fever as directed by your caregiver.  SEEK IMMEDIATE MEDICAL CARE IF:  You have redness, swelling, or increasing pain in the wound.     You notice pus coming from the wound.     You have a fever.     You notice a bad smell coming from the wound or dressing.  Document Released: 06/10/2001 Document Revised: 08/27/2011 Document Reviewed: 02/09/2009 [facility] Patient Information 2012 [place], [place].

## 2012-02-02 NOTE — Telephone Encounter (Signed)
May add on to schedule today

## 2012-02-02 NOTE — Telephone Encounter (Signed)
Pt called concerned about an infected area on his LT thumb that is swollen with yellow drainage. Pt does not recall any injury or trauma to the area, and as a Diabetic he is concerned,

## 2012-02-09 ENCOUNTER — Other Ambulatory Visit: Payer: Self-pay | Admitting: Internal Medicine

## 2012-02-09 NOTE — Telephone Encounter (Signed)
Denied: last refill 01.10.2013 #[id number]

## 2012-02-23 ENCOUNTER — Telehealth: Payer: Self-pay | Admitting: *Deleted

## 2012-02-23 NOTE — Telephone Encounter (Signed)
[facility] is requesting clarification on patient's Welchol dosage [twice a day OR three times daily]; looked at

## 2012-03-15 ENCOUNTER — Other Ambulatory Visit: Payer: Self-pay | Admitting: Internal Medicine

## 2012-03-15 NOTE — Telephone Encounter (Signed)
Welchol request: different Sig instructions in EMR chart; Take [1] tab PO TID w/meals & Take [3] tabs twice daily. Please advise.

## 2012-03-15 NOTE — Telephone Encounter (Signed)
Done

## 2012-03-20 ENCOUNTER — Other Ambulatory Visit: Payer: Self-pay | Admitting: Internal Medicine

## 2012-03-25 ENCOUNTER — Telehealth: Payer: Self-pay

## 2012-03-25 MED ORDER — FLUNISOLIDE 29 MCG/ACT NA SOLN: 2.0000 | Freq: Two times a day (BID) | NASAL | Status: DC

## 2012-03-25 NOTE — Telephone Encounter (Signed)
Pt called stating he was given a sample of Nasarel that worked well. Pt is requesting Rz, please advise.

## 2012-03-25 NOTE — Telephone Encounter (Signed)
Ok for nasarel 2l sprays each nostril bid. 1 bottle, prn refills

## 2012-03-25 NOTE — Telephone Encounter (Signed)
Pt advised of Rx/pharmacy 

## 2012-04-17 ENCOUNTER — Other Ambulatory Visit: Payer: Self-pay | Admitting: Internal Medicine

## 2012-05-12 ENCOUNTER — Other Ambulatory Visit: Payer: Self-pay | Admitting: Internal Medicine

## 2012-06-20 ENCOUNTER — Other Ambulatory Visit: Payer: Self-pay | Admitting: Internal Medicine

## 2012-07-26 ENCOUNTER — Other Ambulatory Visit: Payer: Self-pay | Admitting: Internal Medicine

## 2012-09-13 ENCOUNTER — Other Ambulatory Visit: Payer: Self-pay | Admitting: Internal Medicine

## 2012-12-04 ENCOUNTER — Other Ambulatory Visit: Payer: Self-pay | Admitting: Internal Medicine

## 2013-02-03 ENCOUNTER — Ambulatory Visit (INDEPENDENT_AMBULATORY_CARE_PROVIDER_SITE_OTHER)
Admission: RE | Admit: 2013-02-03 | Discharge: 2013-02-03 | Disposition: A | Discharge status: Home / Self Care | Payer: BC Managed Care – PPO | Source: Ambulatory Visit | Attending: Internal Medicine | Admitting: Internal Medicine

## 2013-02-03 ENCOUNTER — Ambulatory Visit (INDEPENDENT_AMBULATORY_CARE_PROVIDER_SITE_OTHER): Payer: BC Managed Care – PPO | Admitting: Internal Medicine

## 2013-02-03 ENCOUNTER — Other Ambulatory Visit (INDEPENDENT_AMBULATORY_CARE_PROVIDER_SITE_OTHER): Payer: BC Managed Care – PPO

## 2013-02-03 ENCOUNTER — Encounter: Payer: Self-pay | Admitting: Internal Medicine

## 2013-02-03 VITALS — BP 138/82 | HR 73 | Temp 97.5°F | Resp 10 | Ht 66.0 in | Wt 192.1 lb

## 2013-02-03 DIAGNOSIS — I10 Essential (primary) hypertension: Secondary | ICD-10-CM

## 2013-02-03 DIAGNOSIS — M79641 Pain in right hand: Secondary | ICD-10-CM

## 2013-02-03 DIAGNOSIS — M19049 Primary osteoarthritis, unspecified hand: Secondary | ICD-10-CM

## 2013-02-03 DIAGNOSIS — G8929 Other chronic pain: Secondary | ICD-10-CM

## 2013-02-03 DIAGNOSIS — Z0000 Encounter for general adult medical examination without abnormal findings: Secondary | ICD-10-CM

## 2013-02-03 DIAGNOSIS — M549 Dorsalgia, unspecified: Secondary | ICD-10-CM

## 2013-02-03 DIAGNOSIS — M79642 Pain in left hand: Secondary | ICD-10-CM

## 2013-02-03 DIAGNOSIS — E119 Type 2 diabetes mellitus without complications: Secondary | ICD-10-CM

## 2013-02-03 DIAGNOSIS — Z23 Encounter for immunization: Secondary | ICD-10-CM

## 2013-02-03 DIAGNOSIS — M79609 Pain in unspecified limb: Secondary | ICD-10-CM

## 2013-02-03 DIAGNOSIS — M479 Spondylosis, unspecified: Secondary | ICD-10-CM

## 2013-02-03 DIAGNOSIS — E785 Hyperlipidemia, unspecified: Secondary | ICD-10-CM

## 2013-02-03 LAB — HEPATIC FUNCTION PANEL
ALT: 26 U/L (ref 0–53)
AST: 16 U/L (ref 0–37)
Albumin: 3.8 g/dL (ref 3.5–5.2)
Alkaline Phosphatase: 63 U/L (ref 39–117)
Total Protein: 7.1 g/dL (ref 6.0–8.3)

## 2013-02-03 LAB — COMPREHENSIVE METABOLIC PANEL
ALT: 26 U/L (ref 0–53)
AST: 16 U/L (ref 0–37)
Alkaline Phosphatase: 63 U/L (ref 39–117)
Calcium: 9.3 mg/dL (ref 8.4–10.5)
Chloride: 101 mEq/L (ref 96–112)
Creatinine, Ser: 1.1 mg/dL (ref 0.4–1.5)

## 2013-02-03 LAB — LIPID PANEL
Cholesterol: 228 mg/dL — ABNORMAL HIGH (ref 0–200)
HDL: 25.4 mg/dL — ABNORMAL LOW (ref >39.00)
Total CHOL/HDL Ratio: 9
VLDL: 77.6 mg/dL — ABNORMAL HIGH (ref 0.0–40.0)

## 2013-02-03 NOTE — Progress Notes (Signed)
Subjective:    Patient ID: Nathan Frazier, male    DOB: Jun 02, 1951, 62 y.o.   MRN: [id number]  HPI The patient is here for annual Medicare wellness examination and management of other chronic and acute problems.  He did stop Welchol due to back pain.   He has some mild weight gain.  He is having increased joint pain: Heberden's nodes, knee pain. Usually the pain is post-exercise.  ED - decreased tumescence. May be DM vs vascular.   The risk factors are reflected in the social history.  The roster of all physicians providing medical care to patient - is listed in the Snapshot section of the chart.  Activities of daily living:  The patient is 100% inedpendent in all ADLs: dressing, toileting, feeding as well as independent mobility  Home safety : The patient has smoke detectors in the home. They wear seatbelts.  firearms are present in the home, kept in a safe fashion. There is no violence in the home.   There is no risks for hepatitis, STDs or HIV. There is no  history of blood transfusion. They have no travel history to infectious disease endemic areas of the world.  The patient has  seen their dentist in the last 58month. They have  seen their eye doctor in the last year. They deny  any hearing difficulty and have not had audiologic testing in the last year.  They do not  have excessive sun exposure. Discussed the need for sun protection: hats, long sleeves and use of sunscreen if there is significant sun exposure.   Diet: the importance of a healthy diet is discussed. They do have a healthy diet.  The patient has no regular exercise program.  The benefits of regular aerobic exercise were discussed.  Depression screen: there are no signs or vegative symptoms of depression- irritability, change in appetite, anhedonia, sadness/tearfullness.  Cognitive assessment: the patient manages all their financial and personal affairs and is actively engaged.   The following portions of the  patient's history were reviewed and updated as appropriate: allergies, current medications, past family history, past medical history,  past surgical history, past social history  and problem list.  Past Medical History  Diagnosis Date  . Other and unspecified hyperlipidemia   . Special screening for malignant neoplasms, unspecified intestine   . Unspecified essential hypertension   . Type II or unspecified type diabetes mellitus without mention of complication, not stated as uncontrolled   . Allergic rhinitis   . Familial tremor   . Fracture     right thumb, right elbow   History reviewed. No pertinent past surgical history. History reviewed. No pertinent family history. History   Social History  . Marital Status: Married    Spouse Name: Nathan Frazier    Number of Children: 2  . Years of Education: N/A   Occupational History  . 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 Smoking status: Never Smoker   . Smokeless tobacco: Never Used  . Alcohol Use: Yes  . Drug Use: No  . Sexually Active: Yes -- Male partner(s)   Other Topics Concern  . Not on file   Social History Narrative   [place]Regular exercise: seldomCaffeine use: coffee daily    Current Outpatient Prescriptions on File Prior to Visit  Medication Sig Dispense Refill  . flunisolide (NASAREL) 29 MCG/ACT (0.025%) nasal spray Place 2 sprays into the nose 2 (two) times daily. Dose is for each  nostril.  25 mL  12  . lisinopril (PRINIVIL,ZESTRIL) 20 MG tablet TAKE 1 TABLET BY MOUTH EVERY DAY  30 tablet  5  . metFORMIN (GLUCOPHAGE) 1000 MG tablet TAKE 1 TABLET BY MOUTH TWICE A DAY  60 tablet  6  . ONGLYZA 5 MG TABS tablet TAKE 1 TABLET BY MOUTH EVERY DAY  30 tablet  11  . Colesevelam HCl (WELCHOL) 3.75 G PACK Take 3.75 g by mouth 1 day or 1 dose.  90 each  3  . WELCHOL 625 MG tablet TAKE 3 TABLETS TWICE DAILY  180 tablet  5     Vision, hearing, body mass index were  assessed and reviewed.   During the course of the visit the patient was educated and counseled about appropriate screening and preventive services including : fall prevention , diabetes screening, nutrition counseling, colorectal cancer screening, and recommended immunizations.    Review of Systems Constitutional:  Negative for fever, chills, activity change and unexpected weight change.  HEENT:  Negative for hearing loss, ear pain, congestion, neck stiffness and postnasal drip. Negative for sore throat or swallowing problems. Negative for dental complaints.   Eyes: Negative for vision loss or change in visual acuity.  Respiratory: Negative for chest tightness and wheezing. Negative for DOE.   Cardiovascular: Negative for chest pain or palpitations. No decreased exercise tolerance Gastrointestinal: No change in bowel habit. No bloating or gas. No reflux or indigestion Genitourinary: Negative for urgency, frequency, flank pain and difficulty urinating.  Musculoskeletal: Negative for myalgias, back pain, arthralgias and gait problem.  Neurological: Negative for dizziness, tremors, weakness and headaches.  Hematological: Negative for adenopathy.  Psychiatric/Behavioral: Negative for behavioral problems and dysphoric mood.       Objective:   Physical Exam Filed Vitals:   02/03/13 1455  BP: 138/82  Pulse: 73  Temp: 97.5 F (36.4 C)  Resp: 10   Wt Readings from Last 3 Encounters:  02/03/13 192 lb 1.3 oz (87.127 kg)  02/02/12 194 lb (87.998 kg)  01/08/12 190 lb (86.183 kg)   Gen'l: Well nourished well developed white male in no acute distress  HEENT: Head: Normocephalic and atraumatic. Right Ear: External ear normal. EAC/TM nl. Left Ear: External ear normal.  EAC/TM nl. Nose: Nose normal. Mouth/Throat: Oropharynx is clear and moist. Dentition - native, in good repair. No buccal or palatal lesions. Posterior pharynx clear. Eyes: Conjunctivae and sclera clear. EOM intact. Pupils are  equal, round, and reactive to light. Right eye exhibits no discharge. Left eye exhibits no discharge. Neck: Normal range of motion. Neck supple. No JVD present. No tracheal deviation present. No thyromegaly present.  Cardiovascular: Normal rate, regular rhythm, no gallop, no friction rub, no murmur heard.      Quiet precordium. 2+ radial and DP pulses . No carotid bruits Pulmonary/Chest: Effort normal. No respiratory distress or increased WOB, no wheezes, no rales. No chest wall deformity or CVAT. Abdomen: Soft. Bowel sounds are normal in all quadrants. He exhibits no distension, no tenderness, no rebound or guarding, No heptosplenomegaly  Genitourinary:  deferred Musculoskeletal: Normal range of motion. He exhibits no edema and no tenderness.       DIP and PIP joints both hands with synovial thickening, Heberdeens and Bouchard nodes - mild. Large joints are normal. Full range of motion preserved about all small, median and large joints.  Lymphadenopathy:    He has no cervical or supraclavicular adenopathy.  Neurological: He is alert and oriented to person, place, and time. CN II-XII intact.  DTRs 2+ and symmetrical biceps, radial and patellar tendons. Foot exam - normal sensation to light touch, pin prick. Very subtle decrease in deep vibratory sensation. Cerebellar function normal with no tremor, rigidity, normal gait and station.  Skin: Skin is warm and dry. No rash noted. No erythema.  Psychiatric: He has a normal mood and affect. His behavior is normal. Thought content normal.         Assessment & Plan:

## 2013-02-03 NOTE — Patient Instructions (Addendum)
Thanks for coming in.  Full report to follow along with labs. You can also open a MyChart account for lab result and better communication.  Try the Viagra - start with 1/2 a tab - more bang for the buck.  See you in 6 months for interval follow up on the diabetes

## 2013-02-05 ENCOUNTER — Encounter: Payer: Self-pay | Admitting: Internal Medicine

## 2013-02-05 DIAGNOSIS — M479 Spondylosis, unspecified: Secondary | ICD-10-CM | POA: Insufficient documentation

## 2013-02-05 DIAGNOSIS — M19049 Primary osteoarthritis, unspecified hand: Secondary | ICD-10-CM | POA: Insufficient documentation

## 2013-02-05 DIAGNOSIS — Z0000 Encounter for general adult medical examination without abnormal findings: Secondary | ICD-10-CM | POA: Insufficient documentation

## 2013-02-05 MED ORDER — EZETIMIBE 10 MG PO TABS: 10.0000 mg | ORAL_TABLET | Freq: Every day | ORAL | Status: DC

## 2013-02-05 NOTE — Assessment & Plan Note (Signed)
Good adherence to medical regimen. No hypoglyemic events. Lab Results  Component Value Date   HGBA1C 7.5* 02/03/2013   Above goal of A1C 7% or less but below threshold for medication change.  Plan Continue present medication  Enhance life-style adherence: better no sugar low carb diet; regular aerobic exercise - minimum of three sessions per week with 30 min exercise interval with a target heart rate of 130.  Follow up lab in 3 months (order entered)

## 2013-02-05 NOTE — Assessment & Plan Note (Signed)
BP Readings from Last 3 Encounters:  02/03/13 138/82  02/02/12 130/80  01/08/12 122/74   Adequate control on ACE-I  Plan Continue present medication  Routine lab.

## 2013-02-05 NOTE — Assessment & Plan Note (Addendum)
LDL is not at goal for a diabetic - 100 or less.  Plan - continue welchol   Add Zetia 10 mg daily  F/u lab in 3 months

## 2013-02-05 NOTE — Assessment & Plan Note (Signed)
Mild DJD and degenerative disk disease  Plan-  Flex stretch for the spine. [email or web address] or work with a Advertising account executive  NSAIDs as needed.

## 2013-02-05 NOTE — Assessment & Plan Note (Signed)
Interval history without major illness or surgery. Physical exam is normal. He is current with colorectal cancer screening. Discussed pros and cons of prostate cancer screening (USPHCTF recommendations reviewed and ACU April '13 recommendations) and he defers evaluation at this time. Lab results with elevated A1C and LDL cholesterol, low HDL cholesterol. Immunizations are brought up to date with Tdap and pneumonia vaccine.  In summary - a pleasant man who is medically stable but with suboptimal control of diabetes - to be addressed with better life-style management and elevated lipids. He is encouraged to increase his exercise. He will return in 3 months for follow up lab: A1C, lipid panel.

## 2013-02-05 NOTE — Assessment & Plan Note (Signed)
OA of the fingers, confirmed by x-ray  Plan Hand use - soft rubber ball for exercise  Heat - warm water immersion to help reduce stiffness  NSAIDs - over the counter product of choice, e.g. Aleve, Motrin, etc. GI precautions - watch for irritation of the gut.

## 2013-02-12 ENCOUNTER — Other Ambulatory Visit: Payer: Self-pay

## 2013-04-29 ENCOUNTER — Other Ambulatory Visit: Payer: Self-pay | Admitting: Internal Medicine

## 2013-05-04 ENCOUNTER — Telehealth: Payer: Self-pay

## 2013-05-04 NOTE — Telephone Encounter (Signed)
Pt was transferred to the front desk to schedule an appt

## 2013-05-04 NOTE — Telephone Encounter (Signed)
Patient called [facility] stating that at last appointment he was given three rx that cost him $728. He states that he is scheduled to come in for lab work this week and may need a follow up appointment afterwards to discuss labs and medication changes. He would like a call back to discuss Thanks

## 2013-05-25 ENCOUNTER — Other Ambulatory Visit: Payer: Self-pay | Admitting: Internal Medicine

## 2013-05-27 ENCOUNTER — Other Ambulatory Visit (INDEPENDENT_AMBULATORY_CARE_PROVIDER_SITE_OTHER): Payer: BC Managed Care – PPO

## 2013-05-27 DIAGNOSIS — E785 Hyperlipidemia, unspecified: Secondary | ICD-10-CM

## 2013-05-27 DIAGNOSIS — E119 Type 2 diabetes mellitus without complications: Secondary | ICD-10-CM

## 2013-05-27 LAB — HEMOGLOBIN A1C: Hgb A1c MFr Bld: 7.6 % — ABNORMAL HIGH (ref 4.6–6.5)

## 2013-05-27 LAB — LIPID PANEL
HDL: 34.9 mg/dL — ABNORMAL LOW (ref >39.00)
Total CHOL/HDL Ratio: 6
VLDL: 42.4 mg/dL — ABNORMAL HIGH (ref 0.0–40.0)

## 2013-05-27 LAB — LDL CHOLESTEROL, DIRECT: Direct LDL: 125.7 mg/dL

## 2013-06-02 ENCOUNTER — Encounter: Payer: Self-pay | Admitting: Internal Medicine

## 2013-06-02 ENCOUNTER — Ambulatory Visit (INDEPENDENT_AMBULATORY_CARE_PROVIDER_SITE_OTHER): Payer: BC Managed Care – PPO | Admitting: Internal Medicine

## 2013-06-02 VITALS — BP 146/94 | HR 74 | Temp 98.1°F | Ht 67.0 in | Wt 191.8 lb

## 2013-06-02 DIAGNOSIS — E785 Hyperlipidemia, unspecified: Secondary | ICD-10-CM

## 2013-06-02 DIAGNOSIS — E119 Type 2 diabetes mellitus without complications: Secondary | ICD-10-CM

## 2013-06-02 DIAGNOSIS — I10 Essential (primary) hypertension: Secondary | ICD-10-CM

## 2013-06-02 MED ORDER — PRAVASTATIN SODIUM 40 MG PO TABS: 40.0000 mg | ORAL_TABLET | Freq: Every day | ORAL | Status: DC

## 2013-06-02 MED ORDER — GLIMEPIRIDE 4 MG PO TABS: 4.0000 mg | ORAL_TABLET | Freq: Every day | ORAL | Status: DC

## 2013-06-02 NOTE — Patient Instructions (Addendum)
The lab work shows good control of cholesterol and fair control of diabetes. COSTS are a real issue so we will change medications as listed on the med list.  You will need to return for lab on cholesterol in 4+ weeks, for sugar in 3 months. Orders are entered so that you can mark your calendar and come in at your convenience.

## 2013-06-02 NOTE — Assessment & Plan Note (Signed)
Lipid Panel     Component Value Date/Time   CHOL 198 05/27/2013 0900   TRIG 212.0* 05/27/2013 0900   HDL 34.90* 05/27/2013 0900   CHOLHDL 6 05/27/2013 0900   VLDL 42.4* 05/27/2013 0900   LDLCALC 85 03/21/2010 0851    Plan- cost issues are significant. He did try crestor in the past - stopped due to joint pain. However, the joint pain continue  Will d/c welchol and Zetia  Start Pravastatin 40 mg  Lab in 4 weeks.

## 2013-06-02 NOTE — Assessment & Plan Note (Signed)
BP Readings from Last 3 Encounters:  06/02/13 146/94  02/03/13 138/82  02/02/12 130/80

## 2013-06-02 NOTE — Assessment & Plan Note (Signed)
Lab Results  Component Value Date   HGBA1C 7.6* 05/27/2013   Cost is an issue.  Plan Continue metformin  D/c onglyza  glimeperide 4 mg once a day  A1C in 3 months

## 2013-06-02 NOTE — Progress Notes (Signed)
  Subjective:    Patient ID: Nathan Frazier, male    DOB: 1951-06-20, 62 y.o.   MRN: [id number]  HPI Nathan Frazier presents to review lab and to look at drug changes for cost purposes.   PMH, FamHx and SocHx reviewed for any changes and relevance.  Current Outpatient Prescriptions on File Prior to Visit  Medication Sig Dispense Refill  . ezetimibe (ZETIA) 10 MG tablet Take 1 tablet (10 mg total) by mouth daily.  30 tablet  11  . lisinopril (PRINIVIL,ZESTRIL) 20 MG tablet TAKE 1 TABLET BY MOUTH EVERY DAY  30 tablet  5  . metFORMIN (GLUCOPHAGE) 1000 MG tablet TAKE 1 TABLET BY MOUTH TWICE A DAY  60 tablet  6  . ONGLYZA 5 MG TABS tablet TAKE 1 TABLET BY MOUTH EVERY DAY  30 tablet  11  . WELCHOL 625 MG tablet TAKE 3 TABLETS TWICE DAILY  180 tablet  5  . flunisolide (NASAREL) 29 MCG/ACT (0.025%) nasal spray Place 2 sprays into the nose 2 (two) times daily. Dose is for each nostril.  25 mL  12   No current facility-administered medications on file prior to visit.      Review of Systems System review is negative for any constitutional, cardiac, pulmonary, GI or neuro symptoms or complaints other than as described in the HPI.     Objective:   Physical Exam Filed Vitals:   06/02/13 0943  BP: 146/94  Pulse: 74  Temp: 98.1 F (36.7 C)   Gen'l- WNWD white man Cor- RRR Pulm - CTAP       Assessment & Plan:  After full review of meds reduced monthly drug costs by $500+

## 2013-06-28 ENCOUNTER — Telehealth: Payer: Self-pay

## 2013-06-28 ENCOUNTER — Telehealth: Payer: Self-pay | Admitting: *Deleted

## 2013-06-28 DIAGNOSIS — E119 Type 2 diabetes mellitus without complications: Secondary | ICD-10-CM

## 2013-06-28 NOTE — Telephone Encounter (Signed)
Order entered

## 2013-06-28 NOTE — Telephone Encounter (Signed)
A user error has taken place.

## 2013-06-28 NOTE — Telephone Encounter (Signed)
Patient notified order was placed. He stated he may come by the lab next Monday or Tuesday.

## 2013-06-28 NOTE — Telephone Encounter (Signed)
Phone call from patient [phone]) stating his term life insurance had been turned down due to his microalbumin ratio being so high .43. He is asking for this test to be added to his labs he is having done this week. Please advise. Thanks

## 2013-06-29 ENCOUNTER — Other Ambulatory Visit (INDEPENDENT_AMBULATORY_CARE_PROVIDER_SITE_OTHER): Payer: BC Managed Care – PPO

## 2013-06-29 DIAGNOSIS — E785 Hyperlipidemia, unspecified: Secondary | ICD-10-CM

## 2013-06-29 DIAGNOSIS — E119 Type 2 diabetes mellitus without complications: Secondary | ICD-10-CM

## 2013-06-29 LAB — HEPATIC FUNCTION PANEL
ALT: 23 U/L (ref 0–53)
AST: 17 U/L (ref 0–37)
Albumin: 3.8 g/dL (ref 3.5–5.2)
Alkaline Phosphatase: 66 U/L (ref 39–117)
Total Protein: 7.4 g/dL (ref 6.0–8.3)

## 2013-06-29 LAB — MICROALBUMIN / CREATININE URINE RATIO: Microalb, Ur: 84.5 mg/dL — ABNORMAL HIGH (ref 0.0–1.9)

## 2013-06-29 LAB — LIPID PANEL
Cholesterol: 202 mg/dL — ABNORMAL HIGH (ref 0–200)
Triglycerides: 225 mg/dL — ABNORMAL HIGH (ref 0.0–149.0)

## 2013-06-30 ENCOUNTER — Other Ambulatory Visit: Payer: Self-pay | Admitting: Internal Medicine

## 2013-06-30 DIAGNOSIS — E785 Hyperlipidemia, unspecified: Secondary | ICD-10-CM

## 2013-07-12 ENCOUNTER — Other Ambulatory Visit: Payer: Self-pay

## 2013-07-12 MED ORDER — PRAVASTATIN SODIUM 40 MG PO TABS: 40.0000 mg | ORAL_TABLET | Freq: Every day | ORAL | Status: DC

## 2013-07-12 NOTE — Telephone Encounter (Signed)
Received a fax from [facility] [phone] stating Pravastatin was changed to twice daily. I did not see this in the patient's chart and a refills is needed. Should it be twice a day? Please advise. thanks

## 2013-07-15 ENCOUNTER — Other Ambulatory Visit: Payer: Self-pay

## 2013-07-15 ENCOUNTER — Other Ambulatory Visit: Payer: Self-pay | Admitting: Internal Medicine

## 2013-07-15 MED ORDER — PRAVASTATIN SODIUM 80 MG PO TABS: 80.0000 mg | ORAL_TABLET | Freq: Every day | ORAL | Status: DC

## 2013-07-15 NOTE — Telephone Encounter (Signed)
Dr Debby Bud already sent a script for Pravastatin 80 mg qd

## 2013-08-09 ENCOUNTER — Encounter: Payer: Self-pay | Admitting: Internal Medicine

## 2013-08-09 ENCOUNTER — Ambulatory Visit (INDEPENDENT_AMBULATORY_CARE_PROVIDER_SITE_OTHER): Payer: BC Managed Care – PPO | Admitting: Internal Medicine

## 2013-08-09 VITALS — BP 134/90 | HR 87 | Temp 98.0°F | Wt 199.2 lb

## 2013-08-09 DIAGNOSIS — E119 Type 2 diabetes mellitus without complications: Secondary | ICD-10-CM

## 2013-08-09 DIAGNOSIS — E785 Hyperlipidemia, unspecified: Secondary | ICD-10-CM

## 2013-08-09 MED ORDER — FLUOCINONIDE 0.05 % EX OINT: TOPICAL_OINTMENT | Freq: Two times a day (BID) | CUTANEOUS | Status: DC

## 2013-08-09 NOTE — Patient Instructions (Addendum)
Cholesterol management - with possible memory loss will stop Pravastatin. If your memory returns to normal we will need to consider alternatives: another generic statin vs a bile sequesterant, e.g. Gemfibrozil. Continue the safest treatment which is diet and exercise  Diabetes - if there is no further weight gain will stick with the glimeperide. Be sure that you have regular food intake to avoid low blood sugar - cool, clammy, weak, nausea.  Oral ulcers - aphthous ulcers - often associated sweets, nuts, to much citrus. Plan For continued pain application of fluocinonide ointment can speed healing, apply with q-tip to the ulcer 2 or 3 times a day.     Hypoglycemia (Low Blood Sugar) Hypoglycemia is when the glucose (sugar) in your blood is too low. Hypoglycemia can happen for many reasons. It can happen to people with or without diabetes. Hypoglycemia can develop quickly and can be a medical emergency.  CAUSES  Having hypoglycemia does not mean that you will develop diabetes. Different causes include:  Missed or delayed meals or not enough carbohydrates eaten.  Medication overdose. This could be by accident or deliberate. If by accident, your medication may need to be adjusted or changed.  Exercise or increased activity without adjustments in carbohydrates or medications.  A nerve disorder that affects body functions like your heart rate, blood pressure and digestion (autonomic neuropathy).  A condition where the stomach muscles do not function properly (gastroparesis). Therefore, medications may not absorb properly.  The inability to recognize the signs of hypoglycemia (hypoglycemic unawareness).  Absorption of insulin  may be altered.  Alcohol consumption.  Pregnancy/menstrual cycles/postpartum. This may be due to hormones.  Certain kinds of tumors. This is very rare. SYMPTOMS   Sweating.  Hunger.  Dizziness.  Blurred vision.  Drowsiness.  Weakness.  Headache.  Rapid  heart beat.  Shakiness.  Nervousness. DIAGNOSIS  Diagnosis is made by monitoring blood glucose in one or all of the following ways:  Fingerstick blood glucose monitoring.  Laboratory results. TREATMENT  If you think your blood glucose is low:  Check your blood glucose, if possible. If it is less than 70 mg/dl, take one of the following:  3-4 glucose tablets.   cup juice (prefer clear like apple).   cup "regular" soda pop.  1 cup milk.  -1 tube of glucose gel.  5-6 hard candies.  Do not over treat because your blood glucose (sugar) will only go too high.  Wait 15 minutes and recheck your blood glucose. If it is still less than 70 mg/dl (or below your target range), repeat treatment.  Eat a snack if it is more than one hour until your next meal. Sometimes, your blood glucose may go so low that you are unable to treat yourself. You may need someone to help you. You may even pass out or be unable to swallow. This may require you to get an injection of glucagon, which raises the blood glucose. HOME CARE INSTRUCTIONS  Check blood glucose as recommended by your caregiver.  Take medication as prescribed by your caregiver.  Follow your meal plan. Do not skip meals. Eat on time.  If you are going to drink alcohol, drink it only with meals.  Check your blood glucose before driving.  Check your blood glucose before and after exercise. If you exercise longer or different than usual, be sure to check blood glucose more frequently.  Always carry treatment with you. Glucose tablets are the easiest to carry.  Always wear medical alert jewelry or  carry some form of identification that states that you have diabetes. This will alert people that you have diabetes. If you have hypoglycemia, they will have a better idea on what to do. SEEK MEDICAL CARE IF:   You are having problems keeping your blood sugar at target range.  You are having frequent episodes of hypoglycemia.  You  feel you might be having side effects from your medicines.  You have symptoms of an illness that is not improving after 3-4 days.  You notice a change in vision or a new problem with your vision. SEEK IMMEDIATE MEDICAL CARE IF:   You are a family member or friend of a person whose blood glucose goes below 70 mg/dl and is accompanied by:  Confusion.  A change in mental status.  The inability to swallow.  Passing out. Document Released: 12/15/2005 Document Revised: 03/08/2012 Document Reviewed: 04/12/2012 [facility] Patient Information 2014 [place], [place].

## 2013-08-09 NOTE — Progress Notes (Signed)
  Subjective:    Patient ID: Nathan Frazier, male    DOB: 06-16-1951, 62 y.o.   MRN: [id number]  HPI Was recently changed to pravastatin. He had some mental cloudiness/memory loss and weight gain. He also sees weight gain with glimeperide as well as hypoglycemia when he misses a meal.   PMH, FamHx and SocHx reviewed for any changes and relevance. Current Outpatient Prescriptions on File Prior to Visit  Medication Sig Dispense Refill  . glimepiride (AMARYL) 4 MG tablet Take 1 tablet (4 mg total) by mouth daily before breakfast.  30 tablet  11  . lisinopril (PRINIVIL,ZESTRIL) 20 MG tablet TAKE 1 TABLET BY MOUTH EVERY DAY  30 tablet  5  . metFORMIN (GLUCOPHAGE) 1000 MG tablet TAKE 1 TABLET BY MOUTH TWICE A DAY  60 tablet  6  . pravastatin (PRAVACHOL) 80 MG tablet Take 1 tablet (80 mg total) by mouth daily.  90 tablet  3  . flunisolide (NASAREL) 29 MCG/ACT (0.025%) nasal spray Place 2 sprays into the nose 2 (two) times daily. Dose is for each nostril.  25 mL  12   No current facility-administered medications on file prior to visit.       Review of Systems System review is negative for any constitutional, cardiac, pulmonary, GI or neuro symptoms or complaints other than as described in the HPI.     Objective:   Physical Exam Filed Vitals:   08/09/13 1031  BP: 134/90  Pulse: 87  Temp: 98 F (36.7 C)   Wt Readings from Last 3 Encounters:  08/09/13 199 lb 3.2 oz (90.357 kg)  06/02/13 191 lb 12.8 oz (87 kg)  02/03/13 192 lb 1.3 oz (87.127 kg)   Gen'l - mildly overweight white man in no distress Cor - RRR Pulm - normal Neuro - A&O x 3, normal gait.       Assessment & Plan:

## 2013-08-10 NOTE — Assessment & Plan Note (Signed)
Patient reports memory loss and mental/cognitive decline associated temporally with the use of Pravastatin.  Plan Drug holiday. If symptoms resolve will need to consider alternative drug class, e.g. Bile sequestrants.

## 2013-08-10 NOTE — Assessment & Plan Note (Signed)
Patient reports symptoms c/w hypoglycemia on occasion. Also 8 lbs weight gain.  Plan Continue present medications  3 squares and a snack on a regular basis.

## 2013-08-19 ENCOUNTER — Encounter: Payer: Self-pay | Admitting: Internal Medicine

## 2013-11-03 ENCOUNTER — Other Ambulatory Visit: Payer: Self-pay

## 2013-11-18 ENCOUNTER — Other Ambulatory Visit: Payer: Self-pay | Admitting: Internal Medicine

## 2013-11-19 ENCOUNTER — Other Ambulatory Visit: Payer: Self-pay | Admitting: Internal Medicine

## 2014-04-04 ENCOUNTER — Encounter: Payer: Self-pay | Admitting: Internal Medicine

## 2014-04-29 ENCOUNTER — Encounter: Payer: Self-pay | Admitting: Gastroenterology

## 2014-05-15 ENCOUNTER — Other Ambulatory Visit: Payer: Self-pay | Admitting: *Deleted

## 2014-05-15 MED ORDER — GLIMEPIRIDE 4 MG PO TABS: 4.0000 mg | ORAL_TABLET | Freq: Every day | ORAL | Status: DC

## 2014-05-16 ENCOUNTER — Other Ambulatory Visit: Payer: Self-pay

## 2014-05-16 ENCOUNTER — Telehealth: Payer: Self-pay

## 2014-05-16 MED ORDER — LISINOPRIL 20 MG PO TABS: 20.0000 mg | ORAL_TABLET | Freq: Every day | ORAL | Status: DC

## 2014-05-18 NOTE — Telephone Encounter (Signed)
A user error has taken place: encounter opened in error, closed for administrative reasons.

## 2014-06-29 ENCOUNTER — Telehealth: Payer: Self-pay | Admitting: *Deleted

## 2014-06-29 NOTE — Telephone Encounter (Signed)
Forwarded message to scheduling for former [facility] pt.

## 2014-06-29 NOTE — Telephone Encounter (Signed)
DM fu with Dr. Jenny Reichmann July 23 / physical with Dr. Doug Sou  Nov. 2.

## 2014-06-29 NOTE — Telephone Encounter (Signed)
Patient is due to follow up with diabetes.  However he would like to schedule his yearly physical.  Patient does not want to wait until November to schedule with Dr Doug Sou.  Okay to schedule with a different provider?

## 2014-07-10 ENCOUNTER — Other Ambulatory Visit: Payer: Self-pay

## 2014-07-10 MED ORDER — METFORMIN HCL 1000 MG PO TABS: 1000.0000 mg | ORAL_TABLET | Freq: Two times a day (BID) | ORAL | Status: DC

## 2014-07-20 ENCOUNTER — Encounter: Payer: Self-pay | Admitting: Internal Medicine

## 2014-07-20 ENCOUNTER — Ambulatory Visit (INDEPENDENT_AMBULATORY_CARE_PROVIDER_SITE_OTHER): Payer: Medicare Other | Admitting: Internal Medicine

## 2014-07-20 ENCOUNTER — Other Ambulatory Visit (INDEPENDENT_AMBULATORY_CARE_PROVIDER_SITE_OTHER): Payer: Medicare Other

## 2014-07-20 ENCOUNTER — Encounter: Payer: Self-pay | Admitting: Gastroenterology

## 2014-07-20 VITALS — BP 130/84 | HR 70 | Temp 97.9°F | Ht 67.5 in | Wt 199.2 lb

## 2014-07-20 DIAGNOSIS — IMO0001 Reserved for inherently not codable concepts without codable children: Secondary | ICD-10-CM

## 2014-07-20 DIAGNOSIS — R0789 Other chest pain: Secondary | ICD-10-CM | POA: Insufficient documentation

## 2014-07-20 DIAGNOSIS — R079 Chest pain, unspecified: Secondary | ICD-10-CM

## 2014-07-20 DIAGNOSIS — E1165 Type 2 diabetes mellitus with hyperglycemia: Principal | ICD-10-CM

## 2014-07-20 DIAGNOSIS — Z23 Encounter for immunization: Secondary | ICD-10-CM

## 2014-07-20 DIAGNOSIS — E119 Type 2 diabetes mellitus without complications: Secondary | ICD-10-CM

## 2014-07-20 DIAGNOSIS — I10 Essential (primary) hypertension: Secondary | ICD-10-CM

## 2014-07-20 DIAGNOSIS — E785 Hyperlipidemia, unspecified: Secondary | ICD-10-CM

## 2014-07-20 LAB — LIPID PANEL
Cholesterol: 242 mg/dL — ABNORMAL HIGH (ref 0–200)
HDL: 31.5 mg/dL — ABNORMAL LOW (ref >39.00)
NonHDL: 210.5
Total CHOL/HDL Ratio: 8
Triglycerides: 470 mg/dL — ABNORMAL HIGH (ref 0.0–149.0)
VLDL: 94 mg/dL — AB (ref 0.0–40.0)

## 2014-07-20 LAB — BASIC METABOLIC PANEL
BUN: 14 mg/dL (ref 6–23)
CO2: 29 mEq/L (ref 19–32)
Calcium: 9.7 mg/dL (ref 8.4–10.5)
Chloride: 106 mEq/L (ref 96–112)
Creatinine, Ser: 1.3 mg/dL (ref 0.4–1.5)
GFR: 57.3 mL/min — AB (ref >60.00)
GLUCOSE: 155 mg/dL — AB (ref 70–99)
POTASSIUM: 4.7 meq/L (ref 3.5–5.1)
SODIUM: 142 meq/L (ref 135–145)

## 2014-07-20 LAB — LDL CHOLESTEROL, DIRECT: Direct LDL: 143.3 mg/dL

## 2014-07-20 LAB — HEPATIC FUNCTION PANEL
ALBUMIN: 3.9 g/dL (ref 3.5–5.2)
ALK PHOS: 69 U/L (ref 39–117)
ALT: 32 U/L (ref 0–53)
AST: 22 U/L (ref 0–37)
Bilirubin, Direct: 0.1 mg/dL (ref 0.0–0.3)
Total Bilirubin: 0.8 mg/dL (ref 0.2–1.2)
Total Protein: 7.1 g/dL (ref 6.0–8.3)

## 2014-07-20 LAB — HEMOGLOBIN A1C: HEMOGLOBIN A1C: 8.3 % — AB (ref 4.6–6.5)

## 2014-07-20 NOTE — Progress Notes (Signed)
Pre visit review using our clinic review tool, if applicable. No additional management support is needed unless otherwise documented below in the visit note. 

## 2014-07-20 NOTE — Progress Notes (Signed)
Subjective:    Patient ID: Nathan Frazier, male    DOB: [birth year], 63 y.o.   MRN: [id number]  HPI  Here to f/u; overall doing ok,  Pt denies chest pain, increased sob or doe, wheezing, orthopnea, PND, increased LE swelling, palpitations, dizziness or syncope.  Pt denies polydipsia, polyuria, or low sugar symptoms such as weakness or confusion improved with po intake.  Pt denies new neurological symptoms such as new headache, or facial or extremity weakness or numbness.   Pt states overall good compliance with meds, has been trying to follow lower cholesterol, diabetic diet, with wt overall up 10 lbs with starting the glimeparide but still taking,  but little exercise however. Pt Cut back the metformin from 2 to one per am due to GI upset.  concerns wondering if this would help hard spots such as right medial ankle and heel. Could not take the pravastatain for over 1 yr, due to mental confusion.   Due for colonscopy f/u with hx of colon polyp.   Also with recent CP - dull, left and right, seems to move, ? Indigestion but not clear, not assoc with sob, diaphorises, palp, exercise or position. Past Medical History  Diagnosis Date  . Other and unspecified hyperlipidemia   . Special screening for malignant neoplasms, unspecified intestine   . Unspecified essential hypertension   . Type II or unspecified type diabetes mellitus without mention of complication, not stated as uncontrolled   . Allergic rhinitis   . Familial tremor   . Fracture     right thumb, right elbow   No past surgical history on file.  reports that he has never smoked. He has never used smokeless tobacco. He reports that he drinks alcohol. He reports that he does not use illicit drugs. family history is not on file. Allergies  Allergen Reactions  . Pravastatin     confusion   Current Outpatient Prescriptions on File Prior to Visit  Medication Sig Dispense Refill  . fluocinonide ointment (LIDEX) 0.05 % Apply topically 2 (two)  times daily. For aphthous (oral)ulcer  15 g  1  . glimepiride (AMARYL) 4 MG tablet Take 1 tablet (4 mg total) by mouth daily before breakfast.  30 tablet  4  . lisinopril (PRINIVIL,ZESTRIL) 20 MG tablet Take 1 tablet (20 mg total) by mouth daily.  30 tablet  1  . metFORMIN (GLUCOPHAGE) 1000 MG tablet Take 1 tablet (1,000 mg total) by mouth 2 (two) times daily with a meal.  60 tablet  1  . flunisolide (NASAREL) 29 MCG/ACT (0.025%) nasal spray Place 2 sprays into the nose 2 (two) times daily. Dose is for each nostril.  25 mL  12   No current facility-administered medications on file prior to visit.    Review of Systems  Constitutional: Negative for unusual diaphoresis or other sweats  HENT: Negative for ringing in ear Eyes: Negative for double vision or worsening visual disturbance.  Respiratory: Negative for choking and stridor.   Gastrointestinal: Negative for vomiting or other signifcant bowel change Genitourinary: Negative for hematuria or decreased urine volume.  Musculoskeletal: Negative for other MSK pain or swelling Skin: Negative for color change and worsening wound.  Neurological: Negative for tremors and numbness other than noted  Psychiatric/Behavioral: Negative for decreased concentration or agitation other than above       Objective:   Physical Exam BP 130/84  Pulse 70  Temp(Src) 97.9 F (36.6 C) (Oral)  Ht 5' 7.5" (1.715 m)  Wt  199 lb 4 oz (90.379 kg)  BMI 30.73 kg/m2  SpO2 94% VS noted,  Constitutional: Pt appears well-developed, well-nourished.  HENT: Head: NCAT.  Right Ear: External ear normal.  Left Ear: External ear normal.  Eyes: . Pupils are equal, round, and reactive to light. Conjunctivae and EOM are normal Neck: Normal range of motion. Neck supple.  Cardiovascular: Normal rate and regular rhythm.   Pulmonary/Chest: Effort normal and breath sounds normal.  Abd:  Soft, NT, ND, + BS Neurological: Pt is alert. Not confused , motor grossly intact Skin:  Skin is warm. No rash Psychiatric: Pt behavior is normal. No agitation.     Assessment & Plan:

## 2014-07-20 NOTE — Assessment & Plan Note (Addendum)
ECG reviewed as per emr, atypical, cont same meds, for stress testing

## 2014-07-20 NOTE — Patient Instructions (Addendum)
You had the new Prevnar pneumonia shot today  Your EKG was OK today  Ok to keep the metformin at the 1000 mg in the AM  Please continue all other medications as before, and refills have been done if requested.  Please have the pharmacy call with any other refills you may need.  Please continue your efforts at being more active, low cholesterol diet, and weight control.  You are otherwise up to date with prevention measures today.  Please keep your appointments with your specialists as you may have planned  Please go to the LAB in the Basement (turn left off the elevator) for the tests to be done today  You will be contacted regarding the referral for: stress test  You will be contacted by phone if any changes need to be made immediately.  Otherwise, you will receive a letter about your results with an explanation, but please check with MyChart first.  Please remember to sign up for MyChart if you have not done so, as this will be important to you in the future with finding out test results, communicating by private email, and scheduling acute appointments online when needed.  Please return in 6 months, or sooner if needed, with Dr Doug Sou

## 2014-07-23 NOTE — Assessment & Plan Note (Signed)
stable overall by history and exam, recent data reviewed with pt, and pt to continue medical treatment as before,  to f/u any worsening symptoms or concerns Lab Results  Component Value Date   [place] 85 03/21/2010   For f'u lab

## 2014-07-23 NOTE — Assessment & Plan Note (Signed)
stable overall by history and exam, control unclear as he has been adjusting his meds, to cont metformin at 1000 in am only for now, and pt to continue medical treatment as before,  to f/u any worsening symptoms or concerns, for f/u a1c today

## 2014-07-23 NOTE — Assessment & Plan Note (Signed)
stable overall by history and exam, recent data reviewed with pt, and pt to continue medical treatment as before,  to f/u any worsening symptoms or concerns BP Readings from Last 3 Encounters:  07/20/14 130/84  08/09/13 134/90  06/02/13 146/94

## 2014-07-25 ENCOUNTER — Other Ambulatory Visit: Payer: Self-pay | Admitting: Internal Medicine

## 2014-07-25 MED ORDER — SITAGLIPTIN PHOSPHATE 50 MG PO TABS: 50.0000 mg | ORAL_TABLET | Freq: Every day | ORAL | Status: DC

## 2014-07-25 MED ORDER — ATORVASTATIN CALCIUM 10 MG PO TABS: 10.0000 mg | ORAL_TABLET | Freq: Every day | ORAL | Status: DC

## 2014-07-26 ENCOUNTER — Encounter: Payer: Self-pay | Admitting: Internal Medicine

## 2014-07-26 NOTE — Telephone Encounter (Signed)
Dr Asa Lente to review, former pt of Dr Linda Hedges who is wary of being cared for by a young new doctor (one of his first statements at our last visit), and apparently may not have been well satisfied with our last visit.  thanks

## 2014-07-28 ENCOUNTER — Encounter: Payer: Self-pay | Admitting: Gastroenterology

## 2014-08-05 ENCOUNTER — Other Ambulatory Visit: Payer: Self-pay | Admitting: Internal Medicine

## 2014-08-29 ENCOUNTER — Encounter: Payer: Self-pay | Admitting: Internal Medicine

## 2014-08-30 ENCOUNTER — Ambulatory Visit (HOSPITAL_COMMUNITY): Payer: Medicare Other | Attending: Internal Medicine | Admitting: Radiology

## 2014-08-30 VITALS — BP 138/85 | Ht 67.0 in | Wt 195.0 lb

## 2014-08-30 DIAGNOSIS — R0989 Other specified symptoms and signs involving the circulatory and respiratory systems: Secondary | ICD-10-CM | POA: Diagnosis not present

## 2014-08-30 DIAGNOSIS — R42 Dizziness and giddiness: Secondary | ICD-10-CM | POA: Diagnosis not present

## 2014-08-30 DIAGNOSIS — I7789 Other specified disorders of arteries and arterioles: Secondary | ICD-10-CM | POA: Insufficient documentation

## 2014-08-30 DIAGNOSIS — R0602 Shortness of breath: Secondary | ICD-10-CM

## 2014-08-30 DIAGNOSIS — Z8249 Family history of ischemic heart disease and other diseases of the circulatory system: Secondary | ICD-10-CM | POA: Diagnosis not present

## 2014-08-30 DIAGNOSIS — R079 Chest pain, unspecified: Secondary | ICD-10-CM | POA: Insufficient documentation

## 2014-08-30 DIAGNOSIS — R030 Elevated blood-pressure reading, without diagnosis of hypertension: Secondary | ICD-10-CM | POA: Diagnosis not present

## 2014-08-30 DIAGNOSIS — R0609 Other forms of dyspnea: Secondary | ICD-10-CM | POA: Diagnosis not present

## 2014-08-30 MED ORDER — TECHNETIUM TC 99M SESTAMIBI GENERIC - CARDIOLITE
33.0000 | Freq: Once | INTRAVENOUS | Status: AC | PRN
Start: 2014-08-30 — End: 2014-08-30
  Administered 2014-08-30: 33 via INTRAVENOUS

## 2014-08-30 MED ORDER — TECHNETIUM TC 99M SESTAMIBI GENERIC - CARDIOLITE
11.0000 | Freq: Once | INTRAVENOUS | Status: AC | PRN
  Administered 2014-08-30: 11 via INTRAVENOUS

## 2014-08-30 NOTE — Progress Notes (Signed)
[facility] [address] [place], [place] [address] [phone]    Cardiology Nuclear Med Study  Nathan Frazier is a 63 y.o. male     MRN : [id number]     DOB: Aug 15, 1951  Procedure Date: 08/30/2014  Nuclear Med Background Indication for Stress Test:  Evaluation for Ischemia History:  No Known History of CAD Cardiac Risk Factors: Carotid Disease,Strong Premature Family History - CAD, Hypertension and NIDDM  Symptoms:  Chest Pain, Dizziness and DOE   Nuclear Pre-Procedure Caffeine/Decaff Intake:  None> 12 hrs NPO After: 10:00pm   Lungs:  clear O2 Sat: 96% on room air. IV 0.9% NS with Angio Cath:  22g  IV Site: R Wrist x 1, tolerated well IV Started by:  Nathan Baltimore, RN  Chest Size (in):  44 Cup Size: n/a  Height: 5\' 7"  (1.702 m)  Weight:  195 lb (88.451 kg)  BMI:  Body mass index is 30.53 kg/(m^2). Tech Comments:  Patient held Amaryl,and Metformin. Last Lisinopril 10:30am yesterday.Nathan Baltimore, RN.    Nuclear Med Study 1 or 2 day study: 1 day  Stress Test Type:  Stress  Reading MD: N/A  Order Authorizing Provider:  Cathlean Cower, MD  Resting Radionuclide: Technetium 4m Sestamibi  Resting Radionuclide Dose: 11.0 mCi   Stress Radionuclide:  Technetium 61m Sestamibi  Stress Radionuclide Dose: 33.0 mCi           Stress Protocol Rest HR: 79 Stress HR: 142  Rest BP: 138/85 Stress BP: 212/85  Exercise Time (min): 6:00 METS: 7.00   Predicted Max HR: 155 bpm % Max HR: 91.61 bpm Rate Pressure Product: 30104   Dose of Adenosine (mg):  n/a Dose of Lexiscan: n/a mg  Dose of Atropine (mg): n/a Dose of Dobutamine: n/a mcg/kg/min (at max HR)  Stress Test Technologist: Nathan Frazier, EMT-P  Nuclear Technologist:  Nathan Frazier, CNMT     Rest Procedure:  Myocardial perfusion imaging was performed at rest 45 minutes following the intravenous administration of Technetium 42m Sestamibi. Rest ECG: NSR - Normal EKG  Stress Procedure:  The patient  exercised on the treadmill utilizing the Bruce Protocol for 6:00 minutes. The patient stopped due to sob/leg fatigue and denied any chest pain.  Technetium 66m Sestamibi was injected at peak exercise and myocardial perfusion imaging was performed after a brief delay. Stress ECG: No significant ST segment change suggestive of ischemia.  QPS Raw Data Images:  Normal; no motion artifact; normal heart/lung ratio. Stress Images:  Normal homogeneous uptake in all areas of the myocardium. Rest Images:  Normal homogeneous uptake in all areas of the myocardium. Subtraction (SDS):  No evidence of ischemia. Transient Ischemic Dilatation (Normal <1.22):  1.01 Lung/Heart Ratio (Normal <0.45):  0.37  Quantitative Gated Spect Images QGS EDV:  83 ml QGS ESV:  45 ml  Impression Exercise Capacity:  Fair exercise capacity. BP Response:  Hypertensive blood pressure response. Clinical Symptoms:  No significant symptoms noted. ECG Impression:  No significant ST segment change suggestive of ischemia. Comparison with Prior Nuclear Study: No images to compare  Overall Impression:  Normal stress nuclear study.  No evidence of ischemia.  LV Ejection Fraction: 55%.  LV Wall Motion:  NL LV Function; NL Wall Motion.  The computer calculated the EF at 45%.  I think the actual EF is closer to 55%.   The computer generated contours did not follow the true myocardium.    Nathan Frazier, Nathan Bonito., MD, [facility] 08/30/2014, 4:54  PM [address],  [place] Pager [phone]

## 2014-09-13 ENCOUNTER — Telehealth: Payer: Self-pay | Admitting: *Deleted

## 2014-09-13 NOTE — Telephone Encounter (Signed)
Noted ok to proceed per Dr Ardis Hughs. ewm

## 2014-09-13 NOTE — Telephone Encounter (Signed)
Dr Ardis Hughs,  Nathan Frazier has a pre visit scheduled for Friday 9-18 and a colon scheduled with you on 10-2.  He saw Dr Jenny Reichmann 07-20-14 for chest pain, in the office his EKG was ok per the notes in epic and he was referred for a stress test. This was done on 9-2 at [facility] ( in epic) and showed his EF to range from 45-55%.  i do not see where ha has seen cardio in the office. Just want to see if you want to proceed with his colon as scheduled or if he needs an OV with you OR if he needs cardiac clearance first.  Thanks for your time. Please advise  Lelan Pons Pre visit

## 2014-09-13 NOTE — Telephone Encounter (Signed)
Note from Dr. Jenny Reichmann on his nuc med stress test;  Test was negative, was planning to continue with med management.  Ok to proceed with upcoming colonoscopy as scheduled.

## 2014-09-15 ENCOUNTER — Ambulatory Visit (AMBULATORY_SURGERY_CENTER): Payer: Self-pay | Admitting: *Deleted

## 2014-09-15 VITALS — Ht 67.0 in | Wt 193.8 lb

## 2014-09-15 DIAGNOSIS — Z86010 Personal history of colonic polyps: Secondary | ICD-10-CM

## 2014-09-15 NOTE — Progress Notes (Signed)
No allergies to eggs or soy. No problems with anesthesia.  Pt given Emmi instructions for colonoscopy  No oxygen use  No diet drug use  

## 2014-09-29 ENCOUNTER — Ambulatory Visit (AMBULATORY_SURGERY_CENTER): Payer: Medicare Other | Admitting: Gastroenterology

## 2014-09-29 ENCOUNTER — Encounter: Payer: Self-pay | Admitting: Gastroenterology

## 2014-09-29 VITALS — BP 126/70 | HR 75 | Temp 97.7°F | Resp 20 | Ht 67.0 in | Wt 193.0 lb

## 2014-09-29 DIAGNOSIS — E119 Type 2 diabetes mellitus without complications: Secondary | ICD-10-CM | POA: Diagnosis not present

## 2014-09-29 DIAGNOSIS — K5730 Diverticulosis of large intestine without perforation or abscess without bleeding: Secondary | ICD-10-CM

## 2014-09-29 DIAGNOSIS — I10 Essential (primary) hypertension: Secondary | ICD-10-CM | POA: Diagnosis not present

## 2014-09-29 DIAGNOSIS — Z86010 Personal history of colonic polyps: Secondary | ICD-10-CM

## 2014-09-29 MED ORDER — SODIUM CHLORIDE 0.9 % IV SOLN: 500.0000 mL | INTRAVENOUS | Status: DC

## 2014-09-29 NOTE — Op Note (Signed)
[place]  [address] [place], [address]   COLONOSCOPY PROCEDURE REPORT  PATIENT: Nathan Frazier, Nathan Frazier  MR#: [id number] BIRTHDATE: 1951-07-10 , 40  yrs. old GENDER: male ENDOSCOPIST: Milus Banister, MD PROCEDURE DATE:  09/29/2014 PROCEDURE:   Colonoscopy, diagnostic First Screening Colonoscopy - Avg.  risk and is 50 yrs.  old or older - No.  Prior Negative Screening - Now for repeat screening. N/A  History of Adenoma - Now for follow-up colonoscopy & has been > or = to 3 yrs.  Yes hx of adenoma.  Has been 3 or more years since last colonoscopy.  Polyps Removed Today? No.  Recommend repeat exam, <10 yrs? Yes.  High risk (family or personal hx). ASA CLASS:   Class II INDICATIONS:2009 colonoscopy Dr.  Ardis Hughs 3cm TA removed from hepatic flexure, 2010 colonsocopy Dr.  Ardis Hughs that site was clear of residual, recurrent polyp (N[place] Ink aided location). MEDICATIONS: Monitored anesthesia care and Propofol 250 mg IV  DESCRIPTION OF PROCEDURE:   After the risks benefits and alternatives of the procedure were thoroughly explained, informed consent was obtained.  The digital rectal exam revealed no abnormalities of the rectum.   The LB [id number]  endoscope was introduced through the anus and advanced to the cecum, which was identified by both the appendix and ileocecal valve. No adverse events experienced.   The quality of the prep was excellent.  The instrument was then slowly withdrawn as the colon was fully examined.  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 The site of previous adenom removal at the hepatic flexure was again easily located due to previous Niger Ink injection.  There was no recurrent, residual polyp at the site.  The examination was otherwise normal.  Retroflexed views revealed no abnormalities. The time to cecum=2 minutes 48 seconds.  Withdrawal time=7 minutes 49 seconds.  The scope was withdrawn and the procedure  completed. COMPLICATIONS: There were no immediate complications.  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  The site of previous adenom removal at the hepatic flexure was again easily located due to previous Niger Ink injection.  There was no recurrent, residual polyp at the site.  The examination was otherwise normal  RECOMMENDATIONS: Given your personal history of adenomatous (pre-cancerous) polyps, you will need a repeat colonoscopy in 5 years.  eSigned:  Milus Banister, MD 09/29/2014 1:47 PM   cc: Kathlene November, MD

## 2014-09-29 NOTE — Progress Notes (Signed)
Report to PACU, RN, vss, BBS= Clear.  

## 2014-09-29 NOTE — Patient Instructions (Signed)
YOU HAD AN ENDOSCOPIC PROCEDURE TODAY AT [facility]: Refer to the procedure report that was given to you for any specific questions about what was found during the examination.  If the procedure report does not answer your questions, please call your gastroenterologist to clarify.  If you requested that your care partner not be given the details of your procedure findings, then the procedure report has been included in a sealed envelope for you to review at your convenience later.  YOU SHOULD EXPECT: Some feelings of bloating in the abdomen. Passage of more gas than usual.  Walking can help get rid of the air that was put into your GI tract during the procedure and reduce the bloating. If you had a lower endoscopy (such as a colonoscopy or flexible sigmoidoscopy) you may notice spotting of blood in your stool or on the toilet paper. If you underwent a bowel prep for your procedure, then you may not have a normal bowel movement for a few days.  DIET: Your first meal following the procedure should be a light meal and then it is ok to progress to your normal diet.  A half-sandwich or bowl of soup is an example of a good first meal.  Heavy or fried foods are harder to digest and may make you feel nauseous or bloated.  Likewise meals heavy in dairy and vegetables can cause extra gas to form and this can also increase the bloating.  Drink plenty of fluids but you should avoid alcoholic beverages for 24 hours.Try to eat a high fiber diet.  ACTIVITY: Your care partner should take you home directly after the procedure.  You should plan to take it easy, moving slowly for the rest of the day.  You can resume normal activity the day after the procedure however you should NOT DRIVE or use heavy machinery for 24 hours (because of the sedation medicines used during the test).    SYMPTOMS TO REPORT IMMEDIATELY: A gastroenterologist can be reached at any hour.  During normal business hours, 8:30 AM to 5:00  PM Monday through Friday, call [phone].  After hours and on weekends, please call the GI answering service at [phone] who will take a message and have the physician on call contact you.   Following lower endoscopy (colonoscopy or flexible sigmoidoscopy):  Excessive amounts of blood in the stool  Significant tenderness or worsening of abdominal pains  Swelling of the abdomen that is new, acute  Fever of 100F or higher  FOLLOW UP: If any biopsies were taken you will be contacted by phone or by letter within the next 1-3 weeks.  Call your gastroenterologist if you have not heard about the biopsies in 3 weeks.  Our staff will call the home number listed on your records the next business day following your procedure to check on you and address any questions or concerns that you may have at that time regarding the information given to you following your procedure. This is a courtesy call and so if there is no answer at the home number and we have not heard from you through the emergency physician on call, we will assume that you have returned to your regular daily activities without incident.  SIGNATURES/CONFIDENTIALITY: You and/or your care partner have signed paperwork which will be entered into your electronic medical record.  These signatures attest to the fact that that the information above on your After Visit Summary has been reviewed and is understood.  Full  responsibility of the confidentiality of this discharge information lies with you and/or your care-partner.

## 2014-10-02 ENCOUNTER — Telehealth: Payer: Self-pay | Admitting: *Deleted

## 2014-10-02 NOTE — Telephone Encounter (Signed)
  Follow up Call-  Call back number 09/29/2014  Post procedure Call Back phone  # [phone]  Permission to leave phone message Yes     Patient questions:  Do you have a fever, pain , or abdominal swelling? No. Pain Score  0 *  Have you tolerated food without any problems? Yes.    Have you been able to return to your normal activities? Yes.    Do you have any questions about your discharge instructions: Diet   No. Medications  No. Follow up visit  No.  Do you have questions or concerns about your Care? No.  Actions: * If pain score is 4 or above: No action needed, pain <4.

## 2014-10-03 ENCOUNTER — Other Ambulatory Visit: Payer: Self-pay

## 2014-10-03 MED ORDER — LISINOPRIL 20 MG PO TABS: 20.0000 mg | ORAL_TABLET | Freq: Every day | ORAL | Status: DC

## 2014-10-16 ENCOUNTER — Ambulatory Visit (INDEPENDENT_AMBULATORY_CARE_PROVIDER_SITE_OTHER): Payer: Medicare Other | Admitting: Internal Medicine

## 2014-10-16 ENCOUNTER — Telehealth: Payer: Self-pay | Admitting: Geriatric Medicine

## 2014-10-16 ENCOUNTER — Other Ambulatory Visit (INDEPENDENT_AMBULATORY_CARE_PROVIDER_SITE_OTHER): Payer: Medicare Other

## 2014-10-16 ENCOUNTER — Encounter: Payer: Self-pay | Admitting: Internal Medicine

## 2014-10-16 VITALS — BP 138/82 | HR 78 | Temp 98.1°F | Resp 12 | Ht 67.0 in | Wt 192.0 lb

## 2014-10-16 DIAGNOSIS — E785 Hyperlipidemia, unspecified: Secondary | ICD-10-CM | POA: Diagnosis not present

## 2014-10-16 DIAGNOSIS — I10 Essential (primary) hypertension: Secondary | ICD-10-CM | POA: Diagnosis not present

## 2014-10-16 DIAGNOSIS — IMO0002 Reserved for concepts with insufficient information to code with codable children: Secondary | ICD-10-CM

## 2014-10-16 DIAGNOSIS — E1165 Type 2 diabetes mellitus with hyperglycemia: Secondary | ICD-10-CM

## 2014-10-16 LAB — BASIC METABOLIC PANEL
BUN: 15 mg/dL (ref 6–23)
CALCIUM: 9.7 mg/dL (ref 8.4–10.5)
CHLORIDE: 98 meq/L (ref 96–112)
CO2: 27 meq/L (ref 19–32)
Creatinine, Ser: 1.2 mg/dL (ref 0.4–1.5)
GFR: 65.73 mL/min (ref >60.00)
Glucose, Bld: 520 mg/dL (ref 70–99)
Potassium: 4.6 mEq/L (ref 3.5–5.1)
Sodium: 135 mEq/L (ref 135–145)

## 2014-10-16 LAB — HEMOGLOBIN A1C: HEMOGLOBIN A1C: 11 % — AB (ref 4.6–6.5)

## 2014-10-16 NOTE — Telephone Encounter (Signed)
Noted, please see lab note.

## 2014-10-16 NOTE — Assessment & Plan Note (Signed)
He is doing well right now with lipitor and will follow.

## 2014-10-16 NOTE — Progress Notes (Signed)
Pre visit review using our clinic review tool, if applicable. No additional management support is needed unless otherwise documented below in the visit note. 

## 2014-10-16 NOTE — Telephone Encounter (Signed)
Lab called. Glucose is 520.

## 2014-10-16 NOTE — Assessment & Plan Note (Addendum)
He is taking only [place] right now and thinks that the metformin was causing him problems. Will check HgA1c and reassess regimen. Foot exam done today, reminded him about eye exam.

## 2014-10-16 NOTE — Assessment & Plan Note (Signed)
Does well with lisinoprill only. Check BMP today.

## 2014-10-16 NOTE — Patient Instructions (Signed)
We are going to check your diabetes today and call you with the results.   We would like you to come back in about 3-4 months to check on your diabetes. We want you to work on exercising more to help with your weight and your sugars.   We think that the skin changes on your arm are natural for getting older and have to do with decreased melanocytes (cells that make skin pigment) in your skin and sun exposure can cause more noticeable changes due to those cells not making color as you tan like other skin cells around it. I do not believe that the bumps on your leg are related to a medication. If they bother you we can try a cream for them to help them to go away.  Diabetes and Exercise Exercising regularly is important. It is not just about losing weight. It has many health benefits, such as:  Improving your overall fitness, flexibility, and endurance.  Increasing your bone density.  Helping with weight control.  Decreasing your body fat.  Increasing your muscle strength.  Reducing stress and tension.  Improving your overall health. People with diabetes who exercise gain additional benefits because exercise:  Reduces appetite.  Improves the body's use of blood sugar (glucose).  Helps lower or control blood glucose.  Decreases blood pressure.  Helps control blood lipids (such as cholesterol and triglycerides).  Improves the body's use of the hormone insulin by:  Increasing the body's insulin sensitivity.  Reducing the body's insulin needs.  Decreases the risk for heart disease because exercising:  Lowers cholesterol and triglycerides levels.  Increases the levels of good cholesterol (such as high-density lipoproteins [HDL]) in the body.  Lowers blood glucose levels. YOUR ACTIVITY PLAN  Choose an activity that you enjoy and set realistic goals. Your health care provider or diabetes educator can help you make an activity plan that works for you. Exercise regularly as  directed by your health care provider. This includes:  Performing resistance training twice a week such as push-ups, sit-ups, lifting weights, or using resistance bands.  Performing 150 minutes of cardio exercises each week such as walking, running, or playing sports.  Staying active and spending no more than 90 minutes at one time being inactive. Even short bursts of exercise are good for you. Three 10-minute sessions spread throughout the day are just as beneficial as a single 30-minute session. Some exercise ideas include:  Taking the dog for a walk.  Taking the stairs instead of the elevator.  Dancing to your favorite song.  Doing an exercise video.  Doing your favorite exercise with a friend. RECOMMENDATIONS FOR EXERCISING WITH TYPE 1 OR TYPE 2 DIABETES   Check your blood glucose before exercising. If blood glucose levels are greater than 240 mg/dL, check for urine ketones. Do not exercise if ketones are present.  Avoid injecting insulin into areas of the body that are going to be exercised. For example, avoid injecting insulin into:  The arms when playing tennis.  The legs when jogging.  Keep a record of:  Food intake before and after you exercise.  Expected peak times of insulin action.  Blood glucose levels before and after you exercise.  The type and amount of exercise you have done.  Review your records with your health care provider. Your health care provider will help you to develop guidelines for adjusting food intake and insulin amounts before and after exercising.  If you take insulin or oral hypoglycemic agents, watch for  signs and symptoms of hypoglycemia. They include:  Dizziness.  Shaking.  Sweating.  Chills.  Confusion.  Drink plenty of water while you exercise to prevent dehydration or heat stroke. Body water is lost during exercise and must be replaced.  Talk to your health care provider before starting an exercise program to make sure it is  safe for you. Remember, almost any type of activity is better than none. Document Released: 03/06/2004 Document Revised: 05/01/2014 Document Reviewed: 05/24/2013 [facility] Patient Information 2015 [place], [place]. This information is not intended to replace advice given to you by your health care provider. Make sure you discuss any questions you have with your health care provider.

## 2014-10-16 NOTE — Progress Notes (Signed)
   Subjective:    Patient ID: Nathan Frazier, male    DOB: 1951/03/06, 63 y.o.   MRN: [id number]  HPI The patient is a 63 YO man who is coming in today to establish care. He has PMH of DM type 2, HTN, hyperlipidemia, OA. He is doing fairly well at this time but is troubled by some arthritis in his knees after exercise and as a result he has stopped exercising regularly. He denies chest pains, SOB, abdominal pain. He admits to occasional GERD but does not wish to take anything for it. He wants me to look at his arm where he has some white spots that he noticed in the last several years.   Review of Systems  Constitutional: Positive for activity change. Negative for appetite change, fatigue and unexpected weight change.       Doing less  HENT: Negative.   Eyes: Negative.   Respiratory: Negative for cough, chest tightness, shortness of breath and wheezing.   Cardiovascular: Negative for chest pain, palpitations and leg swelling.  Gastrointestinal: Negative for abdominal pain, diarrhea, constipation and abdominal distention.  Musculoskeletal: Positive for arthralgias and back pain. Negative for gait problem and myalgias.  Skin: Negative.   Neurological: Negative for dizziness, weakness, light-headedness and headaches.      Objective:   Physical Exam  Constitutional: He is oriented to person, place, and time. He appears well-developed and well-nourished.  Overweight  HENT:  Head: Normocephalic and atraumatic.  Eyes: EOM are normal.  Neck: Normal range of motion.  Cardiovascular: Normal rate and regular rhythm.   Pulmonary/Chest: Effort normal and breath sounds normal. No respiratory distress. He has no wheezes. He has no rales.  Abdominal: Soft. Bowel sounds are normal.  Neurological: He is alert and oriented to person, place, and time. Coordination normal.  Skin: Skin is warm and dry.   Filed Vitals:   10/16/14 1302  BP: 138/82  Pulse: 78  Temp: 98.1 F (36.7 C)  TempSrc: Oral    Resp: 12  Height: 5\' 7"  (1.702 m)  Weight: 192 lb (87.091 kg)  SpO2: 99%      Assessment & Plan:  Patient states already had flu shot this season.

## 2014-10-20 ENCOUNTER — Encounter: Payer: Self-pay | Admitting: Internal Medicine

## 2014-10-20 DIAGNOSIS — L989 Disorder of the skin and subcutaneous tissue, unspecified: Secondary | ICD-10-CM

## 2014-10-24 MED ORDER — SITAGLIPTIN PHOSPHATE 100 MG PO TABS: 100.0000 mg | ORAL_TABLET | Freq: Every day | ORAL | Status: DC

## 2014-10-24 MED ORDER — METFORMIN HCL 500 MG PO TABS: ORAL_TABLET | ORAL | Status: DC

## 2014-10-30 ENCOUNTER — Encounter: Payer: BC Managed Care – PPO | Admitting: Internal Medicine

## 2014-11-30 DIAGNOSIS — L821 Other seborrheic keratosis: Secondary | ICD-10-CM | POA: Diagnosis not present

## 2015-01-10 ENCOUNTER — Telehealth: Payer: Self-pay | Admitting: *Deleted

## 2015-01-10 NOTE — Telephone Encounter (Signed)
Called pt to ask about updates on his flu shot. He stated he was frustrated that nobody had gotten back to him since he apparently called his pharmacy last week about his nasal spray Rx and has not been addressed up to this time. Pt is  suppose to get Rx from PCP's office, he says. Routing this message to [employer]

## 2015-01-11 ENCOUNTER — Other Ambulatory Visit: Payer: Self-pay | Admitting: Geriatric Medicine

## 2015-01-11 MED ORDER — ATORVASTATIN CALCIUM 10 MG PO TABS: 10.0000 mg | ORAL_TABLET | Freq: Every day | ORAL | Status: DC

## 2015-01-24 ENCOUNTER — Encounter: Payer: Self-pay | Admitting: Internal Medicine

## 2015-01-24 ENCOUNTER — Ambulatory Visit (INDEPENDENT_AMBULATORY_CARE_PROVIDER_SITE_OTHER): Payer: Medicare Other | Admitting: Internal Medicine

## 2015-01-24 VITALS — BP 130/84 | HR 87 | Temp 98.7°F | Ht 67.0 in | Wt 191.0 lb

## 2015-01-24 DIAGNOSIS — J309 Allergic rhinitis, unspecified: Secondary | ICD-10-CM

## 2015-01-24 MED ORDER — AMOXICILLIN 500 MG PO CAPS: 500.0000 mg | ORAL_CAPSULE | Freq: Three times a day (TID) | ORAL | Status: DC

## 2015-01-24 MED ORDER — HYDROCODONE-HOMATROPINE 5-1.5 MG/5ML PO SYRP: 5.0000 mL | ORAL_SOLUTION | Freq: Four times a day (QID) | ORAL | Status: DC | PRN

## 2015-01-24 NOTE — Patient Instructions (Signed)
Plain Mucinex (NOT D) for thick secretions ;force NON dairy fluids .   Nasal cleansing in the shower as discussed with lather of mild shampoo.After 10 seconds wash off lather while  exhaling through nostrils. Make sure that all residual soap is removed to prevent irritation.  Flonase OR Nasacort AQ 1 spray in each nostril twice a day as needed. Use the "crossover" technique into opposite nostril spraying toward opposite ear @ 45 degree angle, not straight up into nostril.  Plain Allegra (NOT D )  160 daily , Loratidine 10 mg , OR Zyrtec 10 mg @ bedtime  as needed for itchy eyes & sneezing.  Fill the  prescription for antibiotic if fever, discolored nasal or chest secretions or significant pain above & below eyes appear in the next 48-72 hours. 

## 2015-01-24 NOTE — Progress Notes (Signed)
   Subjective:    Patient ID: Nathan Frazier, male    DOB: 1951-09-29, 64 y.o.   MRN: [id number]  HPI Symptoms began 2 weeks ago as postnasal drainage. This was associated with sore throat and nonproductive cough. He's had some sneezing as well as itchy eyes. Also he's had some discomfort in the temple area bilaterally.  Last night he had slight fever.  He's used saline and  over-the-counter nasal inhaler without benefit. He has purchased but not used a Flonase steroid nasal inhaler.  Review of Systems He denies significant frontal headache, facial pain, nasal purulence, purulent sputum, otic pain, otic discharge.  The cough is not associated with shortness of breath or wheezing.      Objective:   Physical Exam  Pertinent or positive findings include: The left tympanic membrane is slightly dull. There is mild erythema of the nasal mucosa. He also has mild erythema of the oropharynx.  General appearance:Adequately nourished; no acute distress or increased work of breathing is present.  No  lymphadenopathy about the head, neck, or axilla noted.   Eyes: No conjunctival inflammation or lid edema is present. There is no scleral icterus.  Ears:  External ear exam shows no significant lesions or deformities.  Otoscopic examination reveals clear canals, tympanic membranes are intact bilaterally without bulging, retraction, inflammation or discharge.  Nose:  External nasal examination shows no deformity or inflammation.  No septal dislocation or deviation.No obstruction to airflow.   Oral exam: Dental hygiene is good; lips and gums are healthy appearing.There is no oropharyngeal exudate noted.   Neck:  No deformities, thyromegaly, masses, or tenderness noted.   Supple with full range of motion without pain.   Heart:  Normal rate and regular rhythm. S1 and S2 normal without gallop, murmur, click, rub or other extra sounds.   Lungs:Chest clear to auscultation; no wheezes, rhonchi,rales  ,or rubs present.  Extremities:  No cyanosis, edema, or clubbing  noted    Skin: Warm & dry w/o jaundice or tenting.        Assessment & Plan:  #1 allergic rhinitis, doubt rhinosinusitis but @ risk See orders & AVS

## 2015-01-24 NOTE — Progress Notes (Signed)
Pre visit review using our clinic review tool, if applicable. No additional management support is needed unless otherwise documented below in the visit note. 

## 2015-05-14 NOTE — Telephone Encounter (Signed)
Spoke to patient. He said he never requested any information and we must have the wrong patient.

## 2015-06-22 ENCOUNTER — Telehealth: Payer: Self-pay

## 2015-06-22 NOTE — Telephone Encounter (Signed)
Left VM for pt to return call about recheck of HgA1C

## 2015-07-05 ENCOUNTER — Encounter: Payer: Self-pay | Admitting: Gastroenterology

## 2015-07-26 ENCOUNTER — Encounter: Payer: Self-pay | Admitting: Internal Medicine

## 2015-07-26 ENCOUNTER — Ambulatory Visit (INDEPENDENT_AMBULATORY_CARE_PROVIDER_SITE_OTHER): Payer: Medicare Other | Admitting: Internal Medicine

## 2015-07-26 ENCOUNTER — Other Ambulatory Visit (INDEPENDENT_AMBULATORY_CARE_PROVIDER_SITE_OTHER): Payer: Medicare Other

## 2015-07-26 VITALS — BP 160/82 | HR 96 | Temp 98.4°F | Resp 14 | Ht 68.0 in | Wt 191.8 lb

## 2015-07-26 DIAGNOSIS — E1165 Type 2 diabetes mellitus with hyperglycemia: Secondary | ICD-10-CM

## 2015-07-26 DIAGNOSIS — I10 Essential (primary) hypertension: Secondary | ICD-10-CM

## 2015-07-26 DIAGNOSIS — E118 Type 2 diabetes mellitus with unspecified complications: Secondary | ICD-10-CM

## 2015-07-26 DIAGNOSIS — IMO0002 Reserved for concepts with insufficient information to code with codable children: Secondary | ICD-10-CM

## 2015-07-26 LAB — COMPREHENSIVE METABOLIC PANEL
ALT: 25 U/L (ref 0–53)
AST: 15 U/L (ref 0–37)
Albumin: 4.1 g/dL (ref 3.5–5.2)
Alkaline Phosphatase: 82 U/L (ref 39–117)
BUN: 11 mg/dL (ref 6–23)
CO2: 30 mEq/L (ref 19–32)
Calcium: 9.7 mg/dL (ref 8.4–10.5)
Chloride: 98 mEq/L (ref 96–112)
Creatinine, Ser: 1.17 mg/dL (ref 0.40–1.50)
GFR: 66.22 mL/min (ref >60.00)
Glucose, Bld: 421 mg/dL — ABNORMAL HIGH (ref 70–99)
POTASSIUM: 4.3 meq/L (ref 3.5–5.1)
SODIUM: 135 meq/L (ref 135–145)
TOTAL PROTEIN: 7.2 g/dL (ref 6.0–8.3)
Total Bilirubin: 0.6 mg/dL (ref 0.2–1.2)

## 2015-07-26 LAB — HEMOGLOBIN A1C: Hgb A1c MFr Bld: 9.7 % — ABNORMAL HIGH (ref 4.6–6.5)

## 2015-07-26 LAB — MICROALBUMIN / CREATININE URINE RATIO
Creatinine,U: 53 mg/dL
MICROALB/CREAT RATIO: 75.5 mg/g — AB (ref 0.0–30.0)
Microalb, Ur: 40 mg/dL — ABNORMAL HIGH (ref 0.0–1.9)

## 2015-07-26 NOTE — Assessment & Plan Note (Signed)
Unfortunately he did not return as requested to follow up on his sugars. Checking HgA1c and microalbumin to creatinine ratio today. Suspect even more poor control since he is no longer taking his metformin as directed. He is not exercising. Talked to him about the severity of his diabetes. Talked about the need for change. He will stop metformin and continue [place]. Will likely need additional agents and if HgA1c still so high may be worthwhile starting insulin if not improved with changes in 3 months. On ACE-I. Reminded him about the need for yearly eye exam which is not currently up to date. Severe exacerbation of his diabetes.

## 2015-07-26 NOTE — Progress Notes (Signed)
Pre visit review using our clinic review tool, if applicable. No additional management support is needed unless otherwise documented below in the visit note. 

## 2015-07-26 NOTE — Patient Instructions (Signed)
It is okay for you to stop taking the metformin as this could be the medicine causing the skin problem.   We will check the labs today but will definitely need to start more medication for the diabetes. If the HgA1c is still as high as last time we may need to consider starting you on insulin in the near future.   We want you to come back in 3 months to check on the diabetes since it is not well controlled right now.   Diabetes and Standards of Medical Care Diabetes is complicated. You may find that your diabetes team includes a dietitian, nurse, diabetes educator, eye doctor, and more. To help everyone know what is going on and to help you get the care you deserve, the following schedule of care was developed to help keep you on track. Below are the tests, exams, vaccines, medicines, education, and plans you will need. HbA1c test This test shows how well you have controlled your glucose over the past 2-3 months. It is used to see if your diabetes management plan needs to be adjusted.   It is performed at least 2 times a year if you are meeting treatment goals.  It is performed 4 times a year if therapy has changed or if you are not meeting treatment goals. Blood pressure test  This test is performed at every routine medical visit. The goal is less than 140/90 mm Hg for most people, but 130/80 mm Hg in some cases. Ask your health care provider about your goal. Dental exam  Follow up with the dentist regularly. Eye exam  If you are diagnosed with type 1 diabetes as a child, get an exam upon reaching the age of 52 years or older and have had diabetes for 3-5 years. Yearly eye exams are recommended after that initial eye exam.  If you are diagnosed with type 1 diabetes as an adult, get an exam within 5 years of diagnosis and then yearly.  If you are diagnosed with type 2 diabetes, get an exam as soon as possible after the diagnosis and then yearly. Foot care exam  Visual foot exams are  performed at every routine medical visit. The exams check for cuts, injuries, or other problems with the feet.  A comprehensive foot exam should be done yearly. This includes visual inspection as well as assessing foot pulses and testing for loss of sensation.  Check your feet nightly for cuts, injuries, or other problems with your feet. Tell your health care provider if anything is not healing. Kidney function test (urine microalbumin)  This test is performed once a year.  Type 1 diabetes: The first test is performed 5 years after diagnosis.  Type 2 diabetes: The first test is performed at the time of diagnosis.  A serum creatinine and estimated glomerular filtration rate (eGFR) test is done once a year to assess the level of chronic kidney disease (CKD), if present. Lipid profile (cholesterol, HDL, LDL, triglycerides)  Performed every 5 years for most people.  The goal for LDL is less than 100 mg/dL. If you are at high risk, the goal is less than 70 mg/dL.  The goal for HDL is 40 mg/dL-50 mg/dL for men and 50 mg/dL-60 mg/dL for women. An HDL cholesterol of 60 mg/dL or higher gives some protection against heart disease.  The goal for triglycerides is less than 150 mg/dL. Influenza vaccine, pneumococcal vaccine, and hepatitis B vaccine  The influenza vaccine is recommended yearly.  It is  recommended that people with diabetes who are over 18 years old get the pneumonia vaccine. In some cases, two separate shots may be given. Ask your health care provider if your pneumonia vaccination is up to date.  The hepatitis B vaccine is also recommended for adults with diabetes. Diabetes self-management education  Education is recommended at diagnosis and ongoing as needed. Treatment plan  Your treatment plan is reviewed at every medical visit. Document Released: 10/12/2009 Document Revised: 05/01/2014 Document Reviewed: 05/17/2013 [facility] Patient Information 2015 [place], [place]. This  information is not intended to replace advice given to you by your health care provider. Make sure you discuss any questions you have with your health care provider.

## 2015-07-26 NOTE — Progress Notes (Signed)
   Subjective:    Patient ID: Nathan Frazier, male    DOB: 1951/10/01, 64 y.o.   MRN: [id number]  HPI The patient is a 64 YO man coming in to follow up on his diabetes. The last time he was seen was about 8-10 months ago. He was very uncontrolled at that time. He has been trying to exercise some since then. He does not drink sodas or sweet teas. His weight is stable but not decreased from last time. He has stopped taking metformin twice a day and is only taking it once a day. He is not sure he wants to continue taking it as he thinks that it is causing a rash on his skin. He is taking [place] still daily. Not exercising at all right now. Some burning in his feet which is new from last time. Denies numbness in his feet. No nausea, stomach pain, vomiting. No chest pains or SOB.   Review of Systems  Constitutional: Positive for activity change. Negative for appetite change, fatigue and unexpected weight change.       Doing less  HENT: Negative.   Eyes: Negative.   Respiratory: Negative for cough, chest tightness, shortness of breath and wheezing.   Cardiovascular: Negative for chest pain, palpitations and leg swelling.  Gastrointestinal: Negative for abdominal pain, diarrhea, constipation and abdominal distention.  Musculoskeletal: Positive for back pain and arthralgias. Negative for myalgias and gait problem.  Skin: Negative.   Neurological: Negative for dizziness, weakness, light-headedness and headaches.      Objective:   Physical Exam  Constitutional: He is oriented to person, place, and time. He appears well-developed and well-nourished.  Overweight  HENT:  Head: Normocephalic and atraumatic.  Eyes: EOM are normal.  Neck: Normal range of motion.  Cardiovascular: Normal rate and regular rhythm.   Pulmonary/Chest: Effort normal and breath sounds normal. No respiratory distress. He has no wheezes. He has no rales.  Abdominal: Soft. Bowel sounds are normal.  Neurological: He is alert and  oriented to person, place, and time. Coordination normal.  Skin: Skin is warm and dry.  Foot exam normal but burning pains shooting.   Filed Vitals:   07/26/15 1016 07/26/15 1052  BP: 148/82 160/82  Pulse: 96   Temp: 98.4 F (36.9 C)   TempSrc: Oral   Resp: 14   Height: 5\' 8"  (1.727 m)   Weight: 191 lb 12.8 oz (87 kg)   SpO2: 98%       Assessment & Plan:

## 2015-07-26 NOTE — Assessment & Plan Note (Addendum)
BP is mildly elevated today. Can consider increase to lisinopril if still elevated at next visit. If we start invokana for sugars then this may bring his blood pressure down enough. Check BMP today for complications.

## 2015-08-03 ENCOUNTER — Other Ambulatory Visit: Payer: Self-pay | Admitting: Internal Medicine

## 2015-08-03 ENCOUNTER — Telehealth: Payer: Self-pay | Admitting: Internal Medicine

## 2015-08-03 MED ORDER — METFORMIN HCL 1000 MG PO TABS: 1000.0000 mg | ORAL_TABLET | Freq: Two times a day (BID) | ORAL | Status: DC

## 2015-08-03 MED ORDER — GLIPIZIDE ER 10 MG PO TB24: 10.0000 mg | ORAL_TABLET | Freq: Every day | ORAL | Status: DC

## 2015-08-03 NOTE — Telephone Encounter (Signed)
Pt states cannot take metormin though still on med list  Will d/c metformin  Ok for glipizide ER 10 qd  ROV Dr Doug Sou 1 mo

## 2015-08-10 DIAGNOSIS — H2513 Age-related nuclear cataract, bilateral: Secondary | ICD-10-CM | POA: Diagnosis not present

## 2015-08-10 DIAGNOSIS — E119 Type 2 diabetes mellitus without complications: Secondary | ICD-10-CM | POA: Diagnosis not present

## 2015-08-10 LAB — HM DIABETES EYE EXAM

## 2015-08-16 ENCOUNTER — Ambulatory Visit: Payer: Medicare Other | Admitting: Internal Medicine

## 2015-08-22 ENCOUNTER — Encounter: Payer: Self-pay | Admitting: Internal Medicine

## 2015-08-22 ENCOUNTER — Ambulatory Visit (INDEPENDENT_AMBULATORY_CARE_PROVIDER_SITE_OTHER): Payer: Medicare Other | Admitting: Internal Medicine

## 2015-08-22 VITALS — BP 144/92 | HR 82 | Temp 98.7°F | Resp 14 | Ht 67.0 in | Wt 192.1 lb

## 2015-08-22 DIAGNOSIS — Z299 Encounter for prophylactic measures, unspecified: Secondary | ICD-10-CM

## 2015-08-22 DIAGNOSIS — I10 Essential (primary) hypertension: Secondary | ICD-10-CM | POA: Diagnosis not present

## 2015-08-22 DIAGNOSIS — E118 Type 2 diabetes mellitus with unspecified complications: Secondary | ICD-10-CM | POA: Diagnosis not present

## 2015-08-22 DIAGNOSIS — Z418 Encounter for other procedures for purposes other than remedying health state: Secondary | ICD-10-CM | POA: Diagnosis not present

## 2015-08-22 DIAGNOSIS — E1165 Type 2 diabetes mellitus with hyperglycemia: Secondary | ICD-10-CM

## 2015-08-22 DIAGNOSIS — Z23 Encounter for immunization: Secondary | ICD-10-CM | POA: Diagnosis not present

## 2015-08-22 DIAGNOSIS — IMO0002 Reserved for concepts with insufficient information to code with codable children: Secondary | ICD-10-CM

## 2015-08-22 MED ORDER — LISINOPRIL 40 MG PO TABS: 40.0000 mg | ORAL_TABLET | Freq: Every day | ORAL | Status: DC

## 2015-08-22 NOTE — Patient Instructions (Signed)
We will increase the lisinopril to 40 mg daily. I have sent in the new prescription that you will take 1 pill a day. If you have some left at home of the current you can take 2 pills of that once a day until you start the new one.  We will make sure that you get in with Dr. Elyse Hsu for the diabetes to get that under control.

## 2015-08-22 NOTE — Progress Notes (Signed)
Pre visit review using our clinic review tool, if applicable. No additional management support is needed unless otherwise documented below in the visit note. 

## 2015-08-23 NOTE — Assessment & Plan Note (Signed)
Referral placed to endocrinology and he is now taking glipizide and [place]. He is starting to understand that this is a serious problem that needs attention. Also on ACE-I. His neuropathy is stable.

## 2015-08-23 NOTE — Progress Notes (Signed)
   Subjective:    Patient ID: Nathan Frazier, male    DOB: [birth year], 64 y.o.   MRN: [id number]  HPI The patient is a 64 YO man who is coming back in for follow up of starting glipizide for his diabetes. He does not check his sugar. Has not had any symptoms of low sugars. He is now off metformin altogether due to his feeling of side effects. He is taking glipizide and [place]. He would like to see an endocrinologist as he has finally understood that this is a serious problem that his diabetes is not controlled. Still having some numbness and burning in his feet.   Review of Systems  Constitutional: Positive for activity change. Negative for appetite change, fatigue and unexpected weight change.       Doing less  Respiratory: Negative for cough, chest tightness, shortness of breath and wheezing.   Cardiovascular: Negative for chest pain, palpitations and leg swelling.  Gastrointestinal: Negative for abdominal pain, diarrhea, constipation and abdominal distention.  Musculoskeletal: Positive for back pain and arthralgias. Negative for myalgias and gait problem.  Neurological: Positive for numbness. Negative for dizziness, weakness, light-headedness and headaches.      Objective:   Physical Exam  Constitutional: He is oriented to person, place, and time. He appears well-developed and well-nourished.  Overweight  HENT:  Head: Normocephalic and atraumatic.  Eyes: EOM are normal.  Neck: Normal range of motion.  Cardiovascular: Normal rate and regular rhythm.   Pulmonary/Chest: Effort normal and breath sounds normal. No respiratory distress. He has no wheezes. He has no rales.  Abdominal: Soft. Bowel sounds are normal.  Neurological: He is alert and oriented to person, place, and time. Coordination normal.  Skin: Skin is warm and dry.   Filed Vitals:   08/22/15 0943  BP: 144/92  Pulse: 82  Temp: 98.7 F (37.1 C)  TempSrc: Oral  Resp: 14  Height: 5\' 7"  (1.702 m)  Weight: 192 lb 1.9 oz  (87.145 kg)  SpO2: 97%      Assessment & Plan:  Shingles shot given at visit.

## 2015-08-23 NOTE — Assessment & Plan Note (Signed)
BP still moderately elevated and will increase his lisinopril to 40 mg daily (from 20 mg daily). Return in 2 months for labs and recheck BP.

## 2015-09-03 ENCOUNTER — Other Ambulatory Visit: Payer: Self-pay | Admitting: Internal Medicine

## 2015-10-10 DIAGNOSIS — E782 Mixed hyperlipidemia: Secondary | ICD-10-CM | POA: Diagnosis not present

## 2015-10-10 DIAGNOSIS — E1165 Type 2 diabetes mellitus with hyperglycemia: Secondary | ICD-10-CM | POA: Diagnosis not present

## 2015-10-15 DIAGNOSIS — E1122 Type 2 diabetes mellitus with diabetic chronic kidney disease: Secondary | ICD-10-CM | POA: Diagnosis not present

## 2015-10-15 DIAGNOSIS — R809 Proteinuria, unspecified: Secondary | ICD-10-CM | POA: Diagnosis not present

## 2015-10-15 DIAGNOSIS — E1165 Type 2 diabetes mellitus with hyperglycemia: Secondary | ICD-10-CM | POA: Diagnosis not present

## 2015-10-15 DIAGNOSIS — E782 Mixed hyperlipidemia: Secondary | ICD-10-CM | POA: Diagnosis not present

## 2015-10-15 DIAGNOSIS — E1142 Type 2 diabetes mellitus with diabetic polyneuropathy: Secondary | ICD-10-CM | POA: Diagnosis not present

## 2015-10-15 DIAGNOSIS — I10 Essential (primary) hypertension: Secondary | ICD-10-CM | POA: Diagnosis not present

## 2015-10-22 ENCOUNTER — Encounter: Payer: Self-pay | Admitting: Internal Medicine

## 2015-10-22 ENCOUNTER — Ambulatory Visit (INDEPENDENT_AMBULATORY_CARE_PROVIDER_SITE_OTHER): Payer: Medicare Other | Admitting: Internal Medicine

## 2015-10-22 VITALS — BP 118/86 | HR 94 | Temp 97.8°F | Resp 16 | Ht 67.0 in | Wt 192.0 lb

## 2015-10-22 DIAGNOSIS — I10 Essential (primary) hypertension: Secondary | ICD-10-CM | POA: Diagnosis not present

## 2015-10-22 DIAGNOSIS — E118 Type 2 diabetes mellitus with unspecified complications: Secondary | ICD-10-CM

## 2015-10-22 DIAGNOSIS — E1165 Type 2 diabetes mellitus with hyperglycemia: Secondary | ICD-10-CM | POA: Diagnosis not present

## 2015-10-22 DIAGNOSIS — IMO0002 Reserved for concepts with insufficient information to code with codable children: Secondary | ICD-10-CM

## 2015-10-22 NOTE — Progress Notes (Signed)
Pre visit review using our clinic review tool, if applicable. No additional management support is needed unless otherwise documented below in the visit note. 

## 2015-10-22 NOTE — Patient Instructions (Signed)
We will not check any blood work today and will see you back in about 6 months to check on the blood pressure.   Diabetes and Standards of Medical Care Diabetes is complicated. You may find that your diabetes team includes a dietitian, nurse, diabetes educator, eye doctor, and more. To help everyone know what is going on and to help you get the care you deserve, the following schedule of care was developed to help keep you on track. Below are the tests, exams, vaccines, medicines, education, and plans you will need. HbA1c test This test shows how well you have controlled your glucose over the past 2-3 months. It is used to see if your diabetes management plan needs to be adjusted.   It is performed at least 2 times a year if you are meeting treatment goals.  It is performed 4 times a year if therapy has changed or if you are not meeting treatment goals. Blood pressure test  This test is performed at every routine medical visit. The goal is less than 140/90 mm Hg for most people, but 130/80 mm Hg in some cases. Ask your health care provider about your goal. Dental exam  Follow up with the dentist regularly. Eye exam  If you are diagnosed with type 1 diabetes as a child, get an exam upon reaching the age of 9 years or older and having had diabetes for 3-5 years. Yearly eye exams are recommended after that initial eye exam.  If you are diagnosed with type 1 diabetes as an adult, get an exam within 5 years of diagnosis and then yearly.  If you are diagnosed with type 2 diabetes, get an exam as soon as possible after the diagnosis and then yearly. Foot care exam  Visual foot exams are performed at every routine medical visit. The exams check for cuts, injuries, or other problems with the feet.  You should have a complete foot exam performed every year. This exam includes an inspection of the structure and skin of your feet, a check of the pulses in your feet, and a check of the sensation in  your feet.  Type 1 diabetes: The first exam is performed 5 years after diagnosis.  Type 2 diabetes: The first exam is performed at the time of diagnosis.  Check your feet nightly for cuts, injuries, or other problems with your feet. Tell your health care provider if anything is not healing. Kidney function test (urine microalbumin)  This test is performed once a year.  Type 1 diabetes: The first test is performed 5 years after diagnosis.  Type 2 diabetes: The first test is performed at the time of diagnosis.  A serum creatinine and estimated glomerular filtration rate (eGFR) test is done once a year to assess the level of chronic kidney disease (CKD), if present. Lipid profile (cholesterol, HDL, LDL, triglycerides)  Performed every 5 years for most people.  The goal for LDL is less than 100 mg/dL. If you are at high risk, the goal is less than 70 mg/dL.  The goal for HDL is 40 mg/dL-50 mg/dL for men and 50 mg/dL-60 mg/dL for women. An HDL cholesterol of 60 mg/dL or higher gives some protection against heart disease.  The goal for triglycerides is less than 150 mg/dL. Immunizations  The flu (influenza) vaccine is recommended yearly for every person 13 months of age or older who has diabetes.  The pneumonia (pneumococcal) vaccine is recommended for every person 25 years of age or older who  has diabetes. Adults 79 years of age or older may receive the pneumonia vaccine as a series of two separate shots.  The hepatitis B vaccine is recommended for adults shortly after they have been diagnosed with diabetes.  The Tdap (tetanus, diphtheria, and pertussis) vaccine should be given:  According to normal childhood vaccination schedules, for children.  Every 10 years, for adults who have diabetes. Diabetes self-management education  Education is recommended at diagnosis and ongoing as needed. Treatment plan  Your treatment plan is reviewed at every medical visit.   This information is  not intended to replace advice given to you by your health care provider. Make sure you discuss any questions you have with your health care provider.   Document Released: 10/12/2009 Document Revised: 01/05/2015 Document Reviewed: 05/17/2013 Elsevier Interactive Patient Education [employer].

## 2015-10-23 NOTE — Assessment & Plan Note (Signed)
Has had medication changes at endocrinologist and due back to see them in about 1 month. He will try to bring [place] the records from their visit. No low sugars but some high readings. Having trouble with his meter and stopping at his endocrinologist's office after this visit. Foot exam done today. On statin and ACE-I.

## 2015-10-23 NOTE — Assessment & Plan Note (Signed)
BP under better control with the doubling of the lisinopril dosing. Recent BMP at endo so will not recheck today (he will bring [place] the results as he forgot at home). BP at goal today.

## 2015-10-23 NOTE — Progress Notes (Signed)
   Subjective:    Patient ID: Nathan Frazier, male    DOB: September 21, 1951, 64 y.o.   MRN: [id number]  HPI The patient is a 64 YO male coming in for follow up of his diabetes. Last visit we had discussed the changes of adding glipizide. Since that time he has gotten in with endocrinology and states that they have changed his medicines but he does not remember which one was stopped and what was added (he thinks invomet but is not sure). He is working on his diabetes and now sees that this is a big health problem. He is not having any low sugars but is having trouble with his meter. He was having to try 12 strips to get it to work yesterday. Has not really changed his diet much but is thinking about it. No new exercise but feels he is active.   Review of Systems  Constitutional: Positive for activity change. Negative for appetite change, fatigue and unexpected weight change.       About the same  Respiratory: Negative for cough, chest tightness, shortness of breath and wheezing.   Cardiovascular: Negative for chest pain, palpitations and leg swelling.  Gastrointestinal: Negative for abdominal pain, diarrhea, constipation and abdominal distention.  Musculoskeletal: Positive for back pain and arthralgias. Negative for myalgias and gait problem.  Skin: Negative.   Neurological: Positive for numbness. Negative for dizziness, weakness, light-headedness and headaches.  Psychiatric/Behavioral: Negative.       Objective:   Physical Exam  Constitutional: He is oriented to person, place, and time. He appears well-developed and well-nourished.  Overweight  HENT:  Head: Normocephalic and atraumatic.  Eyes: EOM are normal.  Neck: Normal range of motion.  Cardiovascular: Normal rate and regular rhythm.   Pulmonary/Chest: Effort normal and breath sounds normal. No respiratory distress. He has no wheezes. He has no rales.  Abdominal: Soft. Bowel sounds are normal.  Neurological: He is alert and oriented to  person, place, and time. Coordination normal.  Skin: Skin is warm and dry.   Filed Vitals:   10/22/15 1013  BP: 118/86  Pulse: 94  Temp: 97.8 F (36.6 C)  TempSrc: Oral  Resp: 16  Height: 5\' 7"  (1.702 m)  Weight: 192 lb (87.091 kg)  SpO2: 94%      Assessment & Plan:

## 2015-11-13 ENCOUNTER — Telehealth: Payer: Self-pay

## 2015-11-13 DIAGNOSIS — E782 Mixed hyperlipidemia: Secondary | ICD-10-CM | POA: Diagnosis not present

## 2015-11-13 DIAGNOSIS — E1165 Type 2 diabetes mellitus with hyperglycemia: Secondary | ICD-10-CM | POA: Diagnosis not present

## 2015-11-13 NOTE — Telephone Encounter (Signed)
Needs follow up visit

## 2015-11-13 NOTE — Telephone Encounter (Signed)
Pt is requesting a refill on Meloxicam 15mg 

## 2015-11-14 DIAGNOSIS — I10 Essential (primary) hypertension: Secondary | ICD-10-CM | POA: Diagnosis not present

## 2015-11-14 DIAGNOSIS — E1142 Type 2 diabetes mellitus with diabetic polyneuropathy: Secondary | ICD-10-CM | POA: Diagnosis not present

## 2015-11-14 DIAGNOSIS — R809 Proteinuria, unspecified: Secondary | ICD-10-CM | POA: Diagnosis not present

## 2015-11-14 DIAGNOSIS — E1122 Type 2 diabetes mellitus with diabetic chronic kidney disease: Secondary | ICD-10-CM | POA: Diagnosis not present

## 2015-11-14 DIAGNOSIS — E782 Mixed hyperlipidemia: Secondary | ICD-10-CM | POA: Diagnosis not present

## 2015-11-14 DIAGNOSIS — E1165 Type 2 diabetes mellitus with hyperglycemia: Secondary | ICD-10-CM | POA: Diagnosis not present

## 2016-01-09 DIAGNOSIS — E1165 Type 2 diabetes mellitus with hyperglycemia: Secondary | ICD-10-CM | POA: Diagnosis not present

## 2016-01-09 DIAGNOSIS — E782 Mixed hyperlipidemia: Secondary | ICD-10-CM | POA: Diagnosis not present

## 2016-01-10 DIAGNOSIS — E782 Mixed hyperlipidemia: Secondary | ICD-10-CM | POA: Diagnosis not present

## 2016-01-10 DIAGNOSIS — E1122 Type 2 diabetes mellitus with diabetic chronic kidney disease: Secondary | ICD-10-CM | POA: Diagnosis not present

## 2016-01-10 DIAGNOSIS — R809 Proteinuria, unspecified: Secondary | ICD-10-CM | POA: Diagnosis not present

## 2016-01-10 DIAGNOSIS — E1165 Type 2 diabetes mellitus with hyperglycemia: Secondary | ICD-10-CM | POA: Diagnosis not present

## 2016-01-10 DIAGNOSIS — I10 Essential (primary) hypertension: Secondary | ICD-10-CM | POA: Diagnosis not present

## 2016-01-10 DIAGNOSIS — E1142 Type 2 diabetes mellitus with diabetic polyneuropathy: Secondary | ICD-10-CM | POA: Diagnosis not present

## 2016-02-03 ENCOUNTER — Other Ambulatory Visit: Payer: Self-pay | Admitting: Internal Medicine

## 2016-04-17 DIAGNOSIS — E782 Mixed hyperlipidemia: Secondary | ICD-10-CM | POA: Diagnosis not present

## 2016-04-17 DIAGNOSIS — E1165 Type 2 diabetes mellitus with hyperglycemia: Secondary | ICD-10-CM | POA: Diagnosis not present

## 2016-04-18 DIAGNOSIS — E782 Mixed hyperlipidemia: Secondary | ICD-10-CM | POA: Diagnosis not present

## 2016-04-18 DIAGNOSIS — E1142 Type 2 diabetes mellitus with diabetic polyneuropathy: Secondary | ICD-10-CM | POA: Diagnosis not present

## 2016-04-18 DIAGNOSIS — E1165 Type 2 diabetes mellitus with hyperglycemia: Secondary | ICD-10-CM | POA: Diagnosis not present

## 2016-04-18 DIAGNOSIS — E1122 Type 2 diabetes mellitus with diabetic chronic kidney disease: Secondary | ICD-10-CM | POA: Diagnosis not present

## 2016-04-18 DIAGNOSIS — I10 Essential (primary) hypertension: Secondary | ICD-10-CM | POA: Diagnosis not present

## 2016-04-18 DIAGNOSIS — R809 Proteinuria, unspecified: Secondary | ICD-10-CM | POA: Diagnosis not present

## 2016-06-17 DIAGNOSIS — E1165 Type 2 diabetes mellitus with hyperglycemia: Secondary | ICD-10-CM | POA: Diagnosis not present

## 2016-06-17 DIAGNOSIS — E782 Mixed hyperlipidemia: Secondary | ICD-10-CM | POA: Diagnosis not present

## 2016-06-18 DIAGNOSIS — E1122 Type 2 diabetes mellitus with diabetic chronic kidney disease: Secondary | ICD-10-CM | POA: Diagnosis not present

## 2016-06-18 DIAGNOSIS — I10 Essential (primary) hypertension: Secondary | ICD-10-CM | POA: Diagnosis not present

## 2016-06-18 DIAGNOSIS — R809 Proteinuria, unspecified: Secondary | ICD-10-CM | POA: Diagnosis not present

## 2016-06-18 DIAGNOSIS — E1165 Type 2 diabetes mellitus with hyperglycemia: Secondary | ICD-10-CM | POA: Diagnosis not present

## 2016-06-18 DIAGNOSIS — E1142 Type 2 diabetes mellitus with diabetic polyneuropathy: Secondary | ICD-10-CM | POA: Diagnosis not present

## 2016-06-18 DIAGNOSIS — E782 Mixed hyperlipidemia: Secondary | ICD-10-CM | POA: Diagnosis not present

## 2016-08-12 ENCOUNTER — Other Ambulatory Visit: Payer: Self-pay | Admitting: Internal Medicine

## 2016-09-11 ENCOUNTER — Telehealth: Payer: Self-pay

## 2016-09-11 ENCOUNTER — Encounter: Payer: Self-pay | Admitting: Internal Medicine

## 2016-09-11 ENCOUNTER — Telehealth: Payer: Self-pay | Admitting: Emergency Medicine

## 2016-09-11 ENCOUNTER — Ambulatory Visit (INDEPENDENT_AMBULATORY_CARE_PROVIDER_SITE_OTHER): Payer: Medicare Other | Admitting: Internal Medicine

## 2016-09-11 VITALS — BP 128/68 | HR 90 | Temp 97.8°F | Resp 14 | Ht 68.0 in | Wt 183.1 lb

## 2016-09-11 DIAGNOSIS — I10 Essential (primary) hypertension: Secondary | ICD-10-CM

## 2016-09-11 DIAGNOSIS — Z0000 Encounter for general adult medical examination without abnormal findings: Secondary | ICD-10-CM | POA: Diagnosis not present

## 2016-09-11 DIAGNOSIS — E118 Type 2 diabetes mellitus with unspecified complications: Secondary | ICD-10-CM

## 2016-09-11 DIAGNOSIS — E785 Hyperlipidemia, unspecified: Secondary | ICD-10-CM | POA: Diagnosis not present

## 2016-09-11 MED ORDER — SUVOREXANT 10 MG PO TABS: 10.0000 mg | ORAL_TABLET | Freq: Every day | ORAL | 2 refills | Status: DC

## 2016-09-11 NOTE — Telephone Encounter (Signed)
Pt called and stated you both discussed Hep C screening. He was wondering when he needed to have that done. Please advise thanks.

## 2016-09-11 NOTE — Progress Notes (Signed)
Pre visit review using our clinic review tool, if applicable. No additional management support is needed unless otherwise documented below in the visit note. 

## 2016-09-11 NOTE — Telephone Encounter (Signed)
rx for belsomra faxed to [facility]

## 2016-09-11 NOTE — Progress Notes (Signed)
   Subjective:    Patient ID: Nathan Frazier, male    DOB: 03/13/1951, 65 y.o.   MRN: [id number]  HPI Here for medicare wellness, no new complaints. Please see A/P for status and treatment of chronic medical problems.   Diet: DM since diabetic Physical activity: sedentary, plays golf Depression/mood screen: negative Hearing: intact to whispered voice Visual acuity: grossly normal, performs annual eye exam  ADLs: capable Fall risk: none Home safety: good Cognitive evaluation: intact to orientation, naming, recall and repetition EOL planning: adv directives discussed  I have personally reviewed and have noted 1. The patient's medical and social history - reviewed today no changes 2. Their use of alcohol, tobacco or illicit drugs 3. Their current medications and supplements 4. The patient's functional ability including ADL's, fall risks, home safety risks and hearing or visual impairment. 5. Diet and physical activities 6. Evidence for depression or mood disorders 7. Care team reviewed and updated (available in snapshot)  Review of Systems  Constitutional: Negative for activity change, appetite change, fatigue and unexpected weight change.  HENT: Negative.   Eyes: Negative.   Respiratory: Negative for cough, chest tightness, shortness of breath and wheezing.   Cardiovascular: Negative for chest pain, palpitations and leg swelling.  Gastrointestinal: Negative for abdominal distention, abdominal pain, constipation and diarrhea.  Musculoskeletal: Positive for arthralgias and back pain. Negative for gait problem and myalgias.  Skin: Negative.   Neurological: Positive for numbness. Negative for dizziness, weakness, light-headedness and headaches.  Psychiatric/Behavioral: Negative.       Objective:   Physical Exam  Constitutional: He is oriented to person, place, and time. He appears well-developed and well-nourished.  Overweight  HENT:  Head: Normocephalic and atraumatic.  Eyes:  EOM are normal.  Neck: Normal range of motion.  Cardiovascular: Normal rate and regular rhythm.   Pulmonary/Chest: Effort normal and breath sounds normal. No respiratory distress. He has no wheezes. He has no rales.  Abdominal: Soft. Bowel sounds are normal. He exhibits no distension. There is no tenderness. There is no rebound.  Neurological: He is alert and oriented to person, place, and time. Coordination normal.  Skin: Skin is warm and dry.  Psychiatric: He has a normal mood and affect.   Vitals:   09/11/16 1531  BP: 128/68  Pulse: 90  Resp: 14  Temp: 97.8 F (36.6 C)  TempSrc: Oral  SpO2: 96%  Weight: 183 lb 1.9 oz (83.1 kg)  Height: 5\' 8"  (1.727 m)      Assessment & Plan:

## 2016-09-11 NOTE — Patient Instructions (Addendum)
We will have you take 1/2 pill of the lisinopril (blood pressure medicine) instead of a whole pill.   We have sent in the new dose you when you pick up the prescription next time it will be 20 mg.   The invokana could be causing the symptoms.   We have given you a prescription for the sleep called belsomra which you can try to see if it helps.  Health Maintenance, Male A healthy lifestyle and preventative care can promote health and wellness.  Maintain regular health, dental, and eye exams.  Eat a healthy diet. Foods like vegetables, fruits, whole grains, low-fat dairy products, and lean protein foods contain the nutrients you need and are low in calories. Decrease your intake of foods high in solid fats, added sugars, and salt. Get information about a proper diet from your health care provider, if necessary.  Regular physical exercise is one of the most important things you can do for your health. Most adults should get at least 150 minutes of moderate-intensity exercise (any activity that increases your heart rate and causes you to sweat) each week. In addition, most adults need muscle-strengthening exercises on 2 or more days a week.   Maintain a healthy weight. The body mass index (BMI) is a screening tool to identify possible weight problems. It provides an estimate of body fat based on height and weight. Your health care provider can find your BMI and can help you achieve or maintain a healthy weight. For males 20 years and older:  A BMI below 18.5 is considered underweight.  A BMI of 18.5 to 24.9 is normal.  A BMI of 25 to 29.9 is considered overweight.  A BMI of 30 and above is considered obese.  Maintain normal blood lipids and cholesterol by exercising and minimizing your intake of saturated fat. Eat a balanced diet with plenty of fruits and vegetables. Blood tests for lipids and cholesterol should begin at age 40 and be repeated every 5 years. If your lipid or cholesterol  levels are high, you are over age 7, or you are at high risk for heart disease, you may need your cholesterol levels checked more frequently.Ongoing high lipid and cholesterol levels should be treated with medicines if diet and exercise are not working.  If you smoke, find out from your health care provider how to quit. If you do not use tobacco, do not start.  Lung cancer screening is recommended for adults aged 31-80 years who are at high risk for developing lung cancer because of a history of smoking. A yearly low-dose CT scan of the lungs is recommended for people who have at least a 30-pack-year history of smoking and are current smokers or have quit within the past 15 years. A pack year of smoking is smoking an average of 1 pack of cigarettes a day for 1 year (for example, a 30-pack-year history of smoking could mean smoking 1 pack a day for 30 years or 2 packs a day for 15 years). Yearly screening should continue until the smoker has stopped smoking for at least 15 years. Yearly screening should be stopped for people who develop a health problem that would prevent them from having lung cancer treatment.  If you choose to drink alcohol, do not have more than 2 drinks per day. One drink is considered to be 12 oz (360 mL) of beer, 5 oz (150 mL) of wine, or 1.5 oz (45 mL) of liquor.  Avoid the use of street drugs.  Do not share needles with anyone. Ask for help if you need support or instructions about stopping the use of drugs.  High blood pressure causes heart disease and increases the risk of stroke. High blood pressure is more likely to develop in:  People who have blood pressure in the end of the normal range (100-139/85-89 mm Hg).  People who are overweight or obese.  People who are African American.  If you are 62-72 years of age, have your blood pressure checked every 3-5 years. If you are 70 years of age or older, have your blood pressure checked every year. You should have your blood  pressure measured twice--once when you are at a hospital or clinic, and once when you are not at a hospital or clinic. Record the average of the two measurements. To check your blood pressure when you are not at a hospital or clinic, you can use:  An automated blood pressure machine at a pharmacy.  A home blood pressure monitor.  If you are 5-51 years old, ask your health care provider if you should take aspirin to prevent heart disease.  Diabetes screening involves taking a blood sample to check your fasting blood sugar level. This should be done once every 3 years after age 51 if you are at a normal weight and without risk factors for diabetes. Testing should be considered at a younger age or be carried out more frequently if you are overweight and have at least 1 risk factor for diabetes.  Colorectal cancer can be detected and often prevented. Most routine colorectal cancer screening begins at the age of 89 and continues through age 43. However, your health care provider may recommend screening at an earlier age if you have risk factors for colon cancer. On a yearly basis, your health care provider may provide home test kits to check for hidden blood in the stool. A small camera at the end of a tube may be used to directly examine the colon (sigmoidoscopy or colonoscopy) to detect the earliest forms of colorectal cancer. Talk to your health care provider about this at age 14 when routine screening begins. A direct exam of the colon should be repeated every 5-10 years through age 77, unless early forms of precancerous polyps or small growths are found.  People who are at an increased risk for hepatitis B should be screened for this virus. You are considered at high risk for hepatitis B if:  You were born in a country where hepatitis B occurs often. Talk with your health care provider about which countries are considered high risk.  Your parents were born in a high-risk country and you have not  received a shot to protect against hepatitis B (hepatitis B vaccine).  You have HIV or AIDS.  You use needles to inject street drugs.  You live with, or have sex with, someone who has hepatitis B.  You are a man who has sex with other men (MSM).  You get hemodialysis treatment.  You take certain medicines for conditions like cancer, organ transplantation, and autoimmune conditions.  Hepatitis C blood testing is recommended for all people born from [birth year] through 1965 and any individual with known risk factors for hepatitis C.  Healthy men should no longer receive prostate-specific antigen (PSA) blood tests as part of routine cancer screening. Talk to your health care provider about prostate cancer screening.  Testicular cancer screening is not recommended for adolescents or adult males who have no symptoms. Screening includes self-exam,  a health care provider exam, and other screening tests. Consult with your health care provider about any symptoms you have or any concerns you have about testicular cancer.  Practice safe sex. Use condoms and avoid high-risk sexual practices to reduce the spread of sexually transmitted infections (STIs).  You should be screened for STIs, including gonorrhea and chlamydia if:  You are sexually active and are younger than 24 years.  You are older than 24 years, and your health care provider tells you that you are at risk for this type of infection.  Your sexual activity has changed since you were last screened, and you are at an increased risk for chlamydia or gonorrhea. Ask your health care provider if you are at risk.  If you are at risk of being infected with HIV, it is recommended that you take a prescription medicine daily to prevent HIV infection. This is called pre-exposure prophylaxis (PrEP). You are considered at risk if:  You are a man who has sex with other men (MSM).  You are a heterosexual man who is sexually active with multiple  partners.  You take drugs by injection.  You are sexually active with a partner who has HIV.  Talk with your health care provider about whether you are at high risk of being infected with HIV. If you choose to begin PrEP, you should first be tested for HIV. You should then be tested every 3 months for as long as you are taking PrEP.  Use sunscreen. Apply sunscreen liberally and repeatedly throughout the day. You should seek shade when your shadow is shorter than you. Protect yourself by wearing long sleeves, pants, a wide-brimmed hat, and sunglasses year round whenever you are outdoors.  Tell your health care provider of new moles or changes in moles, especially if there is a change in shape or color. Also, tell your health care provider if a mole is larger than the size of a pencil eraser.  A one-time screening for abdominal aortic aneurysm (AAA) and surgical repair of large AAAs by ultrasound is recommended for men aged 62-75 years who are current or former smokers.  Stay current with your vaccines (immunizations).   This information is not intended to replace advice given to you by your health care provider. Make sure you discuss any questions you have with your health care provider.   Document Released: 06/12/2008 Document Revised: 01/05/2015 Document Reviewed: 05/12/2011 Elsevier Interactive Patient Education [employer].

## 2016-09-12 MED ORDER — LISINOPRIL 20 MG PO TABS: 20.0000 mg | ORAL_TABLET | Freq: Every day | ORAL | 3 refills | Status: DC

## 2016-09-12 NOTE — Assessment & Plan Note (Signed)
Reviewed recent labs from endo and LDL 97 on lipitor 10 mg daily.

## 2016-09-12 NOTE — Assessment & Plan Note (Signed)
Seeing endo, recent HgA1c 6.9 and on invokamet, victoza, actos.

## 2016-09-12 NOTE — Assessment & Plan Note (Signed)
Will decrease the lisinopril 20 mg daily since he is now on invokana and having some lightheaded spells. Recent CMP normal.

## 2016-09-12 NOTE — Assessment & Plan Note (Signed)
Declines flu shot, tetanus up to date and shingles done. Colonoscopy done in 2020. Counseled on sun safety and mole surveillance as well as the dangers of distracted driving. Given 10 year screening recommendations.

## 2016-09-15 NOTE — Telephone Encounter (Signed)
Patient may have at next office visit.

## 2016-09-16 DIAGNOSIS — Z2089 Contact with and (suspected) exposure to other communicable diseases: Secondary | ICD-10-CM | POA: Diagnosis not present

## 2016-09-16 DIAGNOSIS — E782 Mixed hyperlipidemia: Secondary | ICD-10-CM | POA: Diagnosis not present

## 2016-09-16 DIAGNOSIS — E1165 Type 2 diabetes mellitus with hyperglycemia: Secondary | ICD-10-CM | POA: Diagnosis not present

## 2016-09-17 DIAGNOSIS — I10 Essential (primary) hypertension: Secondary | ICD-10-CM | POA: Diagnosis not present

## 2016-09-17 DIAGNOSIS — R809 Proteinuria, unspecified: Secondary | ICD-10-CM | POA: Diagnosis not present

## 2016-09-17 DIAGNOSIS — E1122 Type 2 diabetes mellitus with diabetic chronic kidney disease: Secondary | ICD-10-CM | POA: Diagnosis not present

## 2016-09-17 DIAGNOSIS — E1142 Type 2 diabetes mellitus with diabetic polyneuropathy: Secondary | ICD-10-CM | POA: Diagnosis not present

## 2016-09-17 DIAGNOSIS — E782 Mixed hyperlipidemia: Secondary | ICD-10-CM | POA: Diagnosis not present

## 2016-09-17 DIAGNOSIS — Z23 Encounter for immunization: Secondary | ICD-10-CM | POA: Diagnosis not present

## 2016-09-17 DIAGNOSIS — E1165 Type 2 diabetes mellitus with hyperglycemia: Secondary | ICD-10-CM | POA: Diagnosis not present

## 2016-10-02 ENCOUNTER — Other Ambulatory Visit: Payer: Self-pay | Admitting: Internal Medicine

## 2016-10-14 DIAGNOSIS — E119 Type 2 diabetes mellitus without complications: Secondary | ICD-10-CM | POA: Diagnosis not present

## 2016-10-14 DIAGNOSIS — H2513 Age-related nuclear cataract, bilateral: Secondary | ICD-10-CM | POA: Diagnosis not present

## 2016-10-14 DIAGNOSIS — Z7984 Long term (current) use of oral hypoglycemic drugs: Secondary | ICD-10-CM | POA: Diagnosis not present

## 2016-10-14 LAB — HM DIABETES EYE EXAM

## 2016-12-05 ENCOUNTER — Telehealth: Payer: Self-pay

## 2016-12-05 NOTE — Telephone Encounter (Signed)
Sent fax to [facility] for recent eye exam notes.

## 2016-12-09 DIAGNOSIS — E782 Mixed hyperlipidemia: Secondary | ICD-10-CM | POA: Diagnosis not present

## 2016-12-09 DIAGNOSIS — E1122 Type 2 diabetes mellitus with diabetic chronic kidney disease: Secondary | ICD-10-CM | POA: Diagnosis not present

## 2016-12-09 DIAGNOSIS — E1165 Type 2 diabetes mellitus with hyperglycemia: Secondary | ICD-10-CM | POA: Diagnosis not present

## 2016-12-11 DIAGNOSIS — R809 Proteinuria, unspecified: Secondary | ICD-10-CM | POA: Diagnosis not present

## 2016-12-11 DIAGNOSIS — E1165 Type 2 diabetes mellitus with hyperglycemia: Secondary | ICD-10-CM | POA: Diagnosis not present

## 2016-12-11 DIAGNOSIS — E1122 Type 2 diabetes mellitus with diabetic chronic kidney disease: Secondary | ICD-10-CM | POA: Diagnosis not present

## 2016-12-11 DIAGNOSIS — I10 Essential (primary) hypertension: Secondary | ICD-10-CM | POA: Diagnosis not present

## 2016-12-11 DIAGNOSIS — E1142 Type 2 diabetes mellitus with diabetic polyneuropathy: Secondary | ICD-10-CM | POA: Diagnosis not present

## 2016-12-11 DIAGNOSIS — E782 Mixed hyperlipidemia: Secondary | ICD-10-CM | POA: Diagnosis not present

## 2016-12-26 ENCOUNTER — Encounter: Payer: Self-pay | Admitting: Internal Medicine

## 2016-12-26 NOTE — Telephone Encounter (Signed)
Left detailed message requesting Eye Exam Notes.

## 2016-12-26 NOTE — Telephone Encounter (Signed)
Eye exam notes received, abstracted and sent to scan.  

## 2017-03-10 DIAGNOSIS — E1165 Type 2 diabetes mellitus with hyperglycemia: Secondary | ICD-10-CM | POA: Diagnosis not present

## 2017-03-10 DIAGNOSIS — E782 Mixed hyperlipidemia: Secondary | ICD-10-CM | POA: Diagnosis not present

## 2017-03-10 DIAGNOSIS — Z794 Long term (current) use of insulin: Secondary | ICD-10-CM | POA: Diagnosis not present

## 2017-03-12 DIAGNOSIS — E782 Mixed hyperlipidemia: Secondary | ICD-10-CM | POA: Diagnosis not present

## 2017-03-12 DIAGNOSIS — E1122 Type 2 diabetes mellitus with diabetic chronic kidney disease: Secondary | ICD-10-CM | POA: Diagnosis not present

## 2017-03-12 DIAGNOSIS — R809 Proteinuria, unspecified: Secondary | ICD-10-CM | POA: Diagnosis not present

## 2017-03-12 DIAGNOSIS — N182 Chronic kidney disease, stage 2 (mild): Secondary | ICD-10-CM | POA: Diagnosis not present

## 2017-03-12 DIAGNOSIS — E1142 Type 2 diabetes mellitus with diabetic polyneuropathy: Secondary | ICD-10-CM | POA: Diagnosis not present

## 2017-03-12 DIAGNOSIS — I129 Hypertensive chronic kidney disease with stage 1 through stage 4 chronic kidney disease, or unspecified chronic kidney disease: Secondary | ICD-10-CM | POA: Diagnosis not present

## 2017-03-12 DIAGNOSIS — E1165 Type 2 diabetes mellitus with hyperglycemia: Secondary | ICD-10-CM | POA: Diagnosis not present

## 2017-06-08 DIAGNOSIS — E1165 Type 2 diabetes mellitus with hyperglycemia: Secondary | ICD-10-CM | POA: Diagnosis not present

## 2017-06-08 DIAGNOSIS — E782 Mixed hyperlipidemia: Secondary | ICD-10-CM | POA: Diagnosis not present

## 2017-06-12 DIAGNOSIS — E1165 Type 2 diabetes mellitus with hyperglycemia: Secondary | ICD-10-CM | POA: Diagnosis not present

## 2017-06-12 DIAGNOSIS — R809 Proteinuria, unspecified: Secondary | ICD-10-CM | POA: Diagnosis not present

## 2017-06-12 DIAGNOSIS — Z6829 Body mass index (BMI) 29.0-29.9, adult: Secondary | ICD-10-CM | POA: Diagnosis not present

## 2017-06-12 DIAGNOSIS — N182 Chronic kidney disease, stage 2 (mild): Secondary | ICD-10-CM | POA: Diagnosis not present

## 2017-06-12 DIAGNOSIS — E1142 Type 2 diabetes mellitus with diabetic polyneuropathy: Secondary | ICD-10-CM | POA: Diagnosis not present

## 2017-06-12 DIAGNOSIS — E782 Mixed hyperlipidemia: Secondary | ICD-10-CM | POA: Diagnosis not present

## 2017-06-12 DIAGNOSIS — I130 Hypertensive heart and chronic kidney disease with heart failure and stage 1 through stage 4 chronic kidney disease, or unspecified chronic kidney disease: Secondary | ICD-10-CM | POA: Diagnosis not present

## 2017-06-12 DIAGNOSIS — E1122 Type 2 diabetes mellitus with diabetic chronic kidney disease: Secondary | ICD-10-CM | POA: Diagnosis not present

## 2017-09-06 ENCOUNTER — Other Ambulatory Visit: Payer: Self-pay | Admitting: Internal Medicine

## 2017-09-07 DIAGNOSIS — E782 Mixed hyperlipidemia: Secondary | ICD-10-CM | POA: Diagnosis not present

## 2017-09-07 DIAGNOSIS — E1165 Type 2 diabetes mellitus with hyperglycemia: Secondary | ICD-10-CM | POA: Diagnosis not present

## 2017-09-11 DIAGNOSIS — E782 Mixed hyperlipidemia: Secondary | ICD-10-CM | POA: Diagnosis not present

## 2017-09-11 DIAGNOSIS — Z7984 Long term (current) use of oral hypoglycemic drugs: Secondary | ICD-10-CM | POA: Diagnosis not present

## 2017-09-11 DIAGNOSIS — E1122 Type 2 diabetes mellitus with diabetic chronic kidney disease: Secondary | ICD-10-CM | POA: Diagnosis not present

## 2017-09-11 DIAGNOSIS — I129 Hypertensive chronic kidney disease with stage 1 through stage 4 chronic kidney disease, or unspecified chronic kidney disease: Secondary | ICD-10-CM | POA: Diagnosis not present

## 2017-09-11 DIAGNOSIS — N182 Chronic kidney disease, stage 2 (mild): Secondary | ICD-10-CM | POA: Diagnosis not present

## 2017-09-11 DIAGNOSIS — E1142 Type 2 diabetes mellitus with diabetic polyneuropathy: Secondary | ICD-10-CM | POA: Diagnosis not present

## 2017-09-11 DIAGNOSIS — E1165 Type 2 diabetes mellitus with hyperglycemia: Secondary | ICD-10-CM | POA: Diagnosis not present

## 2017-09-11 LAB — LIPID PANEL
Cholesterol: 183 (ref 0–200)
HDL: 39 (ref 35–70)
LDL CALC: 101
Triglycerides: 214 — AB (ref 40–160)

## 2017-09-11 LAB — BASIC METABOLIC PANEL
BUN: 20 (ref 4–21)
CREATININE: 1.2 (ref 0.6–1.3)
GLUCOSE: 147
POTASSIUM: 4.2 (ref 3.4–5.3)
SODIUM: 137 (ref 137–147)

## 2017-09-11 LAB — HEPATIC FUNCTION PANEL
ALT: 24 (ref 10–40)
AST: 16 (ref 14–40)
Alkaline Phosphatase: 63 (ref 25–125)
BILIRUBIN, TOTAL: 0.6

## 2017-09-11 LAB — HEMOGLOBIN A1C: HEMOGLOBIN A1C: 6.6

## 2017-09-14 ENCOUNTER — Encounter: Payer: Self-pay | Admitting: Internal Medicine

## 2017-09-14 NOTE — Progress Notes (Signed)
Abstracted and sent to scan  

## 2017-11-12 NOTE — Progress Notes (Signed)
Subjective:   Nathan Frazier is a 66 y.o. male who presents for Medicare Annual/Subsequent preventive examination.  Review of Systems:  No ROS.  Medicare Wellness Visit. Additional risk factors are reflected in the social history.  Cardiac Risk Factors include: advanced age (>16men, >4 women);diabetes mellitus;dyslipidemia;hypertension;male gender Sleep patterns: gets up 1-2 times nightly to void and sleeps 5-7 hours nightly.  Patient reports insomnia issues, discussed recommended sleep tips.   Home Safety/Smoke Alarms: Feels safe in home. Smoke alarms in place.  Living environment; residence and Firearm Safety: 2-story house, no firearms. Lives with family, no needs for DME, good support system Seat Belt Safety/Bike Helmet: Wears seat belt.     Objective:    Vitals: BP 132/62   Pulse 77   Resp 20   Ht 5\' 8"  (1.727 m)   Wt 187 lb (84.8 kg)   SpO2 98%   BMI 28.43 kg/m   Body mass index is 28.43 kg/m.  Tobacco Social History   Tobacco Use  Smoking Status Never Smoker  Smokeless Tobacco Never Used     Counseling given: Not Answered   Past Medical History:  Diagnosis Date  . Allergic rhinitis   . Arthritis   . Familial tremor   . Fracture    right thumb, right elbow  . Other and unspecified hyperlipidemia   . Special screening for malignant neoplasms, unspecified intestine   . Type II or unspecified type diabetes mellitus without mention of complication, not stated as uncontrolled   . Unspecified essential hypertension    Past Surgical History:  Procedure Laterality Date  . THYROID CYST EXCISION  1983   Family History  Problem Relation Age of Onset  . Heart disease Maternal Grandfather   . Colon cancer Neg Hx    Social History   Substance and Sexual Activity  Sexual Activity Yes  . Partners: Female    Outpatient Encounter Medications as of 11/13/2017  Medication Sig  . atorvastatin (LIPITOR) 10 MG tablet TAKE 1 TABLET (10 MG TOTAL) BY MOUTH DAILY.    . Canagliflozin-Metformin HCl ER (INVOKAMET XR) [phone] MG TB24 Take by mouth. Two tablets once daily before breakfast  . Liraglutide (VICTOZA) 18 MG/3ML SOPN Inject 1.8 mg into the skin daily.  Marland Kitchen lisinopril (PRINIVIL,ZESTRIL) 20 MG tablet Take 1 tablet (20 mg total) by mouth daily. Need office visit for further refills  . pioglitazone (ACTOS) 15 MG tablet Take 15 mg by mouth daily.  . [DISCONTINUED] Suvorexant (BELSOMRA) 10 MG TABS Take 10 mg by mouth at bedtime. (Patient not taking: Reported on 11/13/2017)   No facility-administered encounter medications on file as of 11/13/2017.     Activities of Daily Living In your present state of health, do you have any difficulty performing the following activities: 11/13/2017  Hearing? N  Vision? N  Difficulty concentrating or making decisions? N  Walking or climbing stairs? N  Dressing or bathing? N  Doing errands, shopping? N  Preparing Food and eating ? N  Using the Toilet? N  In the past six months, have you accidently leaked urine? N  Do you have problems with loss of bowel control? N  Managing your Medications? N  Managing your Finances? N  Housekeeping or managing your Housekeeping? N  Some recent data might be hidden    Patient Care Team: Hoyt Koch, MD as PCP - General (Internal Medicine) [place], [place], [place] [facility])   Assessment:    Physical assessment deferred to PCP.  Exercise Activities and Dietary  recommendations Current Exercise Habits: The patient does not participate in regular exercise at present, Exercise limited by: orthopedic condition(s)  Diet (meal preparation, eat out, water intake, caffeinated beverages, dairy products, fruits and vegetables): in general, a "healthy" diet  , well balanced   Reviewed heart healthy and diabetic diet, encouraged patient to increase daily water intake.  Goals    . Patient Stated     Increase my physical activity, start working out at home after work.         Fall Risk Fall Risk  11/13/2017 09/11/2016 07/20/2014  Falls in the past year? Yes Yes No  Number falls in past yr: - 2 or more -  Injury with Fall? - No -   Depression Screen PHQ 2/9 Scores 11/13/2017 09/11/2016 07/20/2014  PHQ - 2 Score 0 0 0  PHQ- 9 Score 0 - -    Cognitive Function MMSE - Mini Mental State Exam 11/13/2017  Orientation to time 5  Orientation to Place 5  Registration 3  Attention/ Calculation 5  Recall 2  Language- name 2 objects 2  Language- repeat 1  Language- follow 3 step command 3  Language- read & follow direction 1  Write a sentence 1  Copy design 1  Total score 29        Immunization History  Administered Date(s) Administered  . Influenza, High Dose Seasonal PF 11/13/2017  . Pneumococcal Conjugate-13 07/20/2014  . Pneumococcal Polysaccharide-23 02/03/2013  . Tdap 02/03/2013  . Zoster 08/22/2015   Screening Tests Health Maintenance  Topic Date Due  . Hepatitis C Screening  June 19, 1951  . OPHTHALMOLOGY EXAM  10/14/2017  . PNA vac Low Risk Adult (2 of 2 - PPSV23) 02/03/2018  . HEMOGLOBIN A1C  03/11/2018  . FOOT EXAM  11/13/2018  . COLONOSCOPY  09/30/2019  . TETANUS/TDAP  02/03/2023  . INFLUENZA VACCINE  Completed      Plan:  Continue to eat heart healthy diet (full of fruits, vegetables, whole grains, lean protein, water--limit salt, fat, and sugar intake) and increase physical activity as tolerated.  Continue doing brain stimulating activities (puzzles, reading, adult coloring books, staying active) to keep memory sharp  I have personally reviewed and noted the following in the patient's chart:   . Medical and social history . Use of alcohol, tobacco or illicit drugs  . Current medications and supplements . Functional ability and status . Nutritional status . Physical activity . Advanced directives . List of other physicians . Vitals . Screenings to include cognitive, depression, and falls . Referrals and appointments  In  addition, I have reviewed and discussed with patient certain preventive protocols, quality metrics, and best practice recommendations. A written personalized care plan for preventive services as well as general preventive health recommendations were provided to patient.     Michiel Cowboy, RN  11/13/2017

## 2017-11-13 ENCOUNTER — Other Ambulatory Visit: Payer: Medicare Other

## 2017-11-13 ENCOUNTER — Ambulatory Visit (INDEPENDENT_AMBULATORY_CARE_PROVIDER_SITE_OTHER): Payer: Medicare Other | Admitting: *Deleted

## 2017-11-13 VITALS — BP 132/62 | HR 77 | Resp 20 | Ht 68.0 in | Wt 187.0 lb

## 2017-11-13 DIAGNOSIS — Z0000 Encounter for general adult medical examination without abnormal findings: Secondary | ICD-10-CM

## 2017-11-13 DIAGNOSIS — Z9189 Other specified personal risk factors, not elsewhere classified: Principal | ICD-10-CM

## 2017-11-13 DIAGNOSIS — Z23 Encounter for immunization: Secondary | ICD-10-CM

## 2017-11-13 DIAGNOSIS — Z1159 Encounter for screening for other viral diseases: Secondary | ICD-10-CM | POA: Diagnosis not present

## 2017-11-13 DIAGNOSIS — E118 Type 2 diabetes mellitus with unspecified complications: Secondary | ICD-10-CM

## 2017-11-13 NOTE — Progress Notes (Signed)
Medical screening examination/treatment/procedure(s) were performed by non-physician practitioner and as supervising physician I was immediately available for consultation/collaboration. I agree with above. Marriana Hibberd A Cyncere Ruhe, MD 

## 2017-11-13 NOTE — Patient Instructions (Addendum)
Continue to eat heart healthy diet (full of fruits, vegetables, whole grains, lean protein, water--limit salt, fat, and sugar intake) and increase physical activity as tolerated.  Continue doing brain stimulating activities (puzzles, reading, adult coloring books, staying active) to keep memory sharp.    Nathan Frazier , Thank you for taking time to come for your Medicare Wellness Visit. I appreciate your ongoing commitment to your health goals. Please review the following plan we discussed and let me know if I can assist you in the future.   These are the goals we discussed: Goals    . Patient Stated     Increase my physical activity, start working out at home after work.         This is a list of the screening recommended for you and due dates:  Health Maintenance  Topic Date Due  .  Hepatitis C: One time screening is recommended by Center for Disease Control  (CDC) for  adults born from [birth year] through 1967.   [birth year]  . Flu Shot  07/29/2017  . Complete foot exam   09/11/2017  . Eye exam for diabetics  10/14/2017  . Pneumonia vaccines (2 of 2 - PPSV23) 02/03/2018  . Hemoglobin A1C  03/11/2018  . Colon Cancer Screening  09/30/2019  . Tetanus Vaccine  02/03/2023     Diphenhydramine capsules or tablets [Insomnia] What is this medicine? DIPHENHYDRAMINE (dye fen HYE dra meen) is an antihistamine. This medicine is used to treat occasional sleeplesness. This medicine may be used for other purposes; ask your health care provider or pharmacist if you have questions. COMMON BRAND NAME(S): Aid to Sleep, Compoz Nighttime Sleep Aid, Nighttime Sleep Aid, Nytol, Simply Sleep, Sleep Tabs, Sominex, Unisom, Vicks Qlearquil Nighttime Allergy Relief, Vicks ZzzQuil Nightime Sleep-Aid What should I tell my health care provider before I take this medicine? They need to know if you have any of these conditions: -glaucoma -high blood pressure or heart disease -liver disease -lung or breathing disease,  like asthma -pain or trouble passing urine -prostate trouble -ulcers or other stomach problems -an unusual or allergic reaction to diphenhydramine, other medicines foods, dyes, or preservatives such as sulfites -pregnant or trying to get pregnant -breast-feeding How should I use this medicine? Take this medicine by mouth with a full glass of water. Follow the directions on the prescription label. Do not take your medicine more often than directed. Talk to your pediatrician regarding the use of this medicine in children. While this drug may be prescribed for children as young as 26 years old for selected conditions, precautions do apply. Patients over 73 years old may have a stronger reaction and need a smaller dose. Overdosage: If you think you have taken too much of this medicine contact a poison control center or emergency room at once. NOTE: This medicine is only for you. Do not share this medicine with others. What if I miss a dose? If you miss a dose, take it as soon as you can. If it is almost time for your next dose, take only that dose. Do not take double or extra doses. What may interact with this medicine? Do not take this medicine with any of the following medications: -MAOIs like Carbex, Eldepryl, Marplan, Nardil, and Parnate This medicine may also interact with the following medications: -alcohol -barbiturates like phenobarbital -medicines for bladder spasm like oxybutynin, tolterodine -medicines for blood pressure -medicines for depression, anxiety, or psychotic disturbances -medicines for movement abnormalities or Parkinson's disease -medicines for sleep -other  medicines for cold, cough, or allergy -some medicines for the stomach like chlordiazepoxide, dicyclomine This list may not describe all possible interactions. Give your health care provider a list of all the medicines, herbs, non-prescription drugs, or dietary supplements you use. Also tell them if you smoke, drink  alcohol, or use illegal drugs. Some items may interact with your medicine. What should I watch for while using this medicine? Visit your doctor or health care professional for regular check ups. Tell your doctor or health care professional if your symptoms do not start to get better or if they get worse. Your mouth may get dry. Chewing sugarless gum or sucking hard candy, and drinking plenty of water may help. Contact your doctor if the problem does not go away or is severe. This medicine may cause dry eyes and blurred vision. If you wear contact lenses you may feel some discomfort. Lubricating drops may help. See your eye doctor if the problem does not go away or is severe. You may get drowsy or dizzy. Do not drive, use machinery, or do anything that needs mental alertness until you know how this medicine affects you. Do not stand or sit up quickly, especially if you are an older patient. This reduces the risk of dizzy or fainting spells. Alcohol may interfere with the effect of this medicine. Avoid alcoholic drinks. What side effects may I notice from receiving this medicine? Side effects that you should report to your doctor or health care professional as soon as possible: -allergic reactions like skin rash, itching or hives, swelling of the face, lips, or tongue -changes in vision -confused, agitated, or nervous -fast, irregular heartbeat -tremor -trouble passing urine or change in the amount of urine -unusual bleeding or bruising -unusually weak or tired Side effects that usually do not require medical attention (report to your doctor or health care professional if they continue or are bothersome): -constipation, diarrhea -drowsy -headache -loss of appetite -stomach upset, vomiting -thick mucus This list may not describe all possible side effects. Call your doctor for medical advice about side effects. You may report side effects to FDA at 1-800-FDA-1088. Where should I keep my  medicine? Keep out of the reach of children. Store at room temperature between 20 and 25 degrees C (68 and 77 degrees F). Keep container closed tightly. Throw away any unused medicine after the expiration date. NOTE: This sheet is a summary. It may not cover all possible information. If you have questions about this medicine, talk to your doctor, pharmacist, or health care provider.  2018 Elsevier/Gold Standard (2008-04-14 16:14:00)

## 2017-11-14 LAB — HEPATITIS C ANTIBODY
HEP C AB: NONREACTIVE
SIGNAL TO CUT-OFF: 0.03 (ref <1.00)

## 2017-11-16 ENCOUNTER — Telehealth: Payer: Self-pay | Admitting: *Deleted

## 2017-11-16 NOTE — Telephone Encounter (Signed)
Called patient and scheduled an appointment with PCP on 11/25/17 to discuss hip pain.

## 2017-11-16 NOTE — Telephone Encounter (Signed)
I have not seen in the last year and cannot do referral. He is free to call if he wants or schedule visit with [place].

## 2017-11-16 NOTE — Telephone Encounter (Signed)
During AWV, patient requested to have a referral to an orthopedic doctor to evaluate his left hip which has been painful for quite some time and patient feels like he will eventually need a replacement.

## 2017-11-24 ENCOUNTER — Telehealth: Payer: Self-pay | Admitting: *Deleted

## 2017-11-24 NOTE — Telephone Encounter (Signed)
Received from [facility] and [facility], forwarded 2 pages to Dr. Sharlet Salina .

## 2017-11-25 ENCOUNTER — Encounter: Payer: Self-pay | Admitting: Internal Medicine

## 2017-11-25 ENCOUNTER — Ambulatory Visit (INDEPENDENT_AMBULATORY_CARE_PROVIDER_SITE_OTHER)
Admission: RE | Admit: 2017-11-25 | Discharge: 2017-11-25 | Disposition: A | Discharge status: Home / Self Care | Payer: Medicare Other | Source: Ambulatory Visit | Attending: Internal Medicine | Admitting: Internal Medicine

## 2017-11-25 ENCOUNTER — Ambulatory Visit (INDEPENDENT_AMBULATORY_CARE_PROVIDER_SITE_OTHER): Payer: Medicare Other | Admitting: Internal Medicine

## 2017-11-25 VITALS — BP 130/80 | HR 69 | Temp 98.2°F | Ht 68.0 in | Wt 185.0 lb

## 2017-11-25 DIAGNOSIS — M25552 Pain in left hip: Secondary | ICD-10-CM

## 2017-11-25 NOTE — Progress Notes (Signed)
   Subjective:    Patient ID: Nathan Frazier, male    DOB: 02-07-1951, 66 y.o.   MRN: [id number]  HPI The patient is a 66 YO man coming in for left hip pain for about a year off and on. Sometimes causes his hip to give out. Taking otc medications for pain. Has changed his gait and causing him problems with activity. He is limping some. Denies injury or overuse recently.   Review of Systems  Constitutional: Negative.   Respiratory: Negative.   Cardiovascular: Negative.   Gastrointestinal: Negative.   Musculoskeletal: Positive for arthralgias, back pain and gait problem.  Neurological: Negative for dizziness, tremors, syncope, facial asymmetry and weakness.      Objective:   Physical Exam  Constitutional: He is oriented to person, place, and time. He appears well-developed and well-nourished.  HENT:  Head: Normocephalic and atraumatic.  Eyes: EOM are normal.  Neck: Normal range of motion.  Cardiovascular: Normal rate and regular rhythm.  Pulmonary/Chest: Effort normal and breath sounds normal. No respiratory distress. He has no wheezes. He has no rales.  Abdominal: Soft. Bowel sounds are normal. He exhibits no distension. There is no tenderness. There is no rebound.  Musculoskeletal: He exhibits tenderness. He exhibits no edema.  Some pain in the left hip laterally  Neurological: He is alert and oriented to person, place, and time. Coordination normal.  Skin: Skin is warm and dry.  Psychiatric: He has a normal mood and affect.   Vitals:   11/25/17 1031  BP: 130/80  Pulse: 69  Temp: 98.2 F (36.8 C)  TempSrc: Oral  SpO2: 98%  Weight: 185 lb (83.9 kg)  Height: 5\' 8"  (1.727 m)      Assessment & Plan:

## 2017-11-25 NOTE — Patient Instructions (Signed)
We will  Check the x-ray and get you in with the orthopedic doctor as well.  Trochanteric Bursitis Rehab Ask your health care provider which exercises are safe for you. Do exercises exactly as told by your health care provider and adjust them as directed. It is normal to feel mild stretching, pulling, tightness, or discomfort as you do these exercises, but you should stop right away if you feel sudden pain or your pain gets worse.Do not begin these exercises until told by your health care provider. Stretching exercises These exercises warm up your muscles and joints and improve the movement and flexibility of your hip. These exercises also help to relieve pain and stiffness. Exercise A: Iliotibial band stretch  1. Lie on your side with your left / right leg in the top position. 2. Bend your left / right knee and grab your ankle. 3. Slowly bring your knee back so your thigh is behind your body. 4. Slowly lower your knee toward the floor until you feel a gentle stretch on the outside of your left / right thigh. If you do not feel a stretch and your knee will not fall farther, place the heel of your other foot on top of your outer knee and pull your thigh down farther. 5. Hold this position for __________ seconds. 6. Slowly return to the starting position. Repeat __________ times. Complete this exercise __________ times a day. Strengthening exercises These exercises build strength and endurance in your hip and pelvis. Endurance is the ability to use your muscles for a long time, even after they get tired. Exercise B: Bridge ( hip extensors) 1. Lie on your back on a firm surface with your knees bent and your feet flat on the floor. 2. Tighten your buttocks muscles and lift your buttocks off the floor until your trunk is level with your thighs. You should feel the muscles working in your buttocks and the back of your thighs. If this exercise is too easy, try doing it with your arms crossed over your  chest. 3. Hold this position for __________ seconds. 4. Slowly return to the starting position. 5. Let your muscles relax completely between repetitions. Repeat __________ times. Complete this exercise __________ times a day. Exercise C: Squats ( knee extensors and  quadriceps) 1. Stand in front of a table, with your feet and knees pointing straight ahead. You may rest your hands on the table for balance but not for support. 2. Slowly bend your knees and lower your hips like you are going to sit in a chair. ? Keep your weight over your heels, not over your toes. ? Keep your lower legs upright so they are parallel with the table legs. ? Do not let your hips go lower than your knees. ? Do not bend lower than told by your health care provider. ? If your hip pain increases, do not bend as low. 3. Hold this position for __________ seconds. 4. Slowly push with your legs to return to standing. Do not use your hands to pull yourself to standing. Repeat __________ times. Complete this exercise __________ times a day. Exercise D: Hip hike 1. Stand sideways on a bottom step. Stand on your left / right leg with your other foot unsupported next to the step. You can hold onto the railing or wall if needed for balance. 2. Keeping your knees straight and your torso square, lift your left / right hip up toward the ceiling. 3. Hold this position for __________ seconds. 4.  Slowly let your left / right hip lower toward the floor, past the starting position. Your foot should get closer to the floor. Do not lean or bend your knees. Repeat __________ times. Complete this exercise __________ times a day. Exercise E: Single leg stand 1. Stand near a counter or door frame that you can hold onto for balance as needed. It is helpful to stand in front of a mirror for this exercise so you can watch your hip. 2. Squeeze your left / right buttock muscles then lift up your other foot. Do not let your left / right hip push  out to the side. 3. Hold this position for __________ seconds. Repeat __________ times. Complete this exercise __________ times a day. This information is not intended to replace advice given to you by your health care provider. Make sure you discuss any questions you have with your health care provider. Document Released: 01/22/2005 Document Revised: 08/21/2016 Document Reviewed: 11/30/2015 Elsevier Interactive Patient Education  [employer].

## 2017-11-26 DIAGNOSIS — M25552 Pain in left hip: Secondary | ICD-10-CM | POA: Insufficient documentation

## 2017-11-26 NOTE — Assessment & Plan Note (Signed)
X-ray ordered today and referral to orthopedic surgery.

## 2017-12-03 ENCOUNTER — Other Ambulatory Visit: Payer: Self-pay | Admitting: Internal Medicine

## 2017-12-07 ENCOUNTER — Ambulatory Visit (INDEPENDENT_AMBULATORY_CARE_PROVIDER_SITE_OTHER): Payer: Self-pay | Admitting: Orthopaedic Surgery

## 2017-12-10 DIAGNOSIS — E782 Mixed hyperlipidemia: Secondary | ICD-10-CM | POA: Diagnosis not present

## 2017-12-10 DIAGNOSIS — E1165 Type 2 diabetes mellitus with hyperglycemia: Secondary | ICD-10-CM | POA: Diagnosis not present

## 2017-12-10 LAB — BASIC METABOLIC PANEL
BUN: 18 (ref 4–21)
Creatinine: 1.2 (ref 0.6–1.3)
Glucose: 175
Potassium: 4.4 (ref 3.4–5.3)
SODIUM: 139 (ref 137–147)

## 2017-12-10 LAB — HEPATIC FUNCTION PANEL
ALT: 29 (ref 10–40)
AST: 16 (ref 14–40)
Alkaline Phosphatase: 58 (ref 25–125)
BILIRUBIN, TOTAL: 0.6

## 2017-12-10 LAB — LIPID PANEL
CHOLESTEROL: 185 (ref 0–200)
HDL: 36 (ref 35–70)
LDL CALC: 111
TRIGLYCERIDES: 192 — AB (ref 40–160)

## 2017-12-10 LAB — HEMOGLOBIN A1C: Hemoglobin A1C: 6.9

## 2017-12-11 DIAGNOSIS — E1142 Type 2 diabetes mellitus with diabetic polyneuropathy: Secondary | ICD-10-CM | POA: Diagnosis not present

## 2017-12-11 DIAGNOSIS — R809 Proteinuria, unspecified: Secondary | ICD-10-CM | POA: Diagnosis not present

## 2017-12-11 DIAGNOSIS — E1165 Type 2 diabetes mellitus with hyperglycemia: Secondary | ICD-10-CM | POA: Diagnosis not present

## 2017-12-11 DIAGNOSIS — I10 Essential (primary) hypertension: Secondary | ICD-10-CM | POA: Diagnosis not present

## 2017-12-11 DIAGNOSIS — E782 Mixed hyperlipidemia: Secondary | ICD-10-CM | POA: Diagnosis not present

## 2017-12-14 ENCOUNTER — Encounter: Payer: Self-pay | Admitting: Internal Medicine

## 2017-12-14 NOTE — Progress Notes (Signed)
Abstracted and sent to scan  

## 2017-12-16 ENCOUNTER — Ambulatory Visit (INDEPENDENT_AMBULATORY_CARE_PROVIDER_SITE_OTHER): Payer: Medicare Other | Admitting: Orthopaedic Surgery

## 2017-12-16 ENCOUNTER — Encounter (INDEPENDENT_AMBULATORY_CARE_PROVIDER_SITE_OTHER): Payer: Self-pay | Admitting: Orthopaedic Surgery

## 2017-12-16 DIAGNOSIS — M1612 Unilateral primary osteoarthritis, left hip: Secondary | ICD-10-CM | POA: Diagnosis not present

## 2017-12-16 DIAGNOSIS — M25552 Pain in left hip: Secondary | ICD-10-CM | POA: Diagnosis not present

## 2017-12-16 NOTE — Progress Notes (Signed)
Office Visit Note   Patient: Nathan Frazier           Date of Birth: 09/21/1951           MRN: [id number] Visit Date: 12/16/2017              Requested by: Hoyt Koch, MD [place], [place] [id number] PCP: Hoyt Koch, MD   Assessment & Plan: Visit Diagnoses:  1. Pain of left hip joint   2. Unilateral primary osteoarthritis, left hip     Plan: He understands that he has severe end-stage arthritis of his left hip.  He would like to try a one-time intra-articular injection in that hip under direct fluoroscopy by Dr. Ernestina Patches and we can work on getting that set up.  All questions concerns were answered and addressed.  I did give him a handout about hip replacement surgery done through direct anterior approach as well.  We will see him back in 4 weeks to see how he is doing overall once he has had a steroid injection in his hip.  Follow-Up Instructions: Return in about 4 weeks (around 01/13/2018).   Orders:  No orders of the defined types were placed in this encounter.  No orders of the defined types were placed in this encounter.     Procedures: No procedures performed   Clinical Data: No additional findings.   Subjective: Chief Complaint  Patient presents with  . Left Hip - Pain  Patient is a very pleasant 66 year old gentleman I am seeing for the first time.  He comes in for a history of left hip pain is been worsening for 8 or 9 months now.  Is constant pain at this point but is on off in severity.  He says he played a lot of football and is young and pitched as well.  He denies any groin pain but does report pain around the trochanteric area.  There is actually x-rays in our system for me to review for his hip.  He does not he is a diabetic but under good blood glucose control with a hemoglobin A1c below 7.  He just takes ibuprofen mainly for pain when it bothers him enough. nHPI  Review of Systems He currently denies any headache, chest  pain, shortness of breath, fever, chills, nausea, vomiting.  Objective: Vital Signs: There were no vitals taken for this visit.  Physical Exam He is alert and oriented x3 and in no acute distress Ortho Exam Examination of his right hip shows a normal right hip with full fluid range of motion.  Examination of his left hip shows significant stiffness with internal/external rotation with pain in the groin and over the greater trochanteric area.  His leg lengths are equal. Specialty Comments:  No specialty comments available.  Imaging: No results found. X-rays on the canopy system independently reviewed show severe end-stage arthritis of his left hip.  There is complete loss of superior lateral joint space with sclerotic changes in particular osteophytes.  There is flattening of the femoral head as well.  PMFS History: Patient Active Problem List   Diagnosis Date Noted  . Left hip pain 11/26/2017  . Routine health maintenance 02/05/2013  . OA (osteoarthritis of the spine) 02/05/2013  . OA (osteoarthritis) of finger 02/05/2013  . ALLERGIC RHINITIS 03/08/2008  . Hyperlipidemia 03/06/2008  . Diabetes mellitus type 2, controlled, with complications ([place]) 59/56/3875  . Essential hypertension 09/27/2007   Past Medical History:  Diagnosis Date  .  Allergic rhinitis   . Arthritis   . Familial tremor   . Fracture    right thumb, right elbow  . Other and unspecified hyperlipidemia   . Special screening for malignant neoplasms, unspecified intestine   . Type II or unspecified type diabetes mellitus without mention of complication, not stated as uncontrolled   . Unspecified essential hypertension     Family History  Problem Relation Age of Onset  . Heart disease Maternal Grandfather   . Colon cancer Neg Hx     Past Surgical History:  Procedure Laterality Date  . THYROID CYST EXCISION  1983   Social History   Occupational History  . Occupation: Lobbyist   Tobacco Use  . Smoking status: Never Smoker  . Smokeless tobacco: Never Used  Substance and Sexual Activity  . Alcohol use: Yes    Alcohol/week: 0.5 oz    Types: 1 Standard drinks or equivalent per week  . Drug use: No  . Sexual activity: Yes    Partners: Female

## 2017-12-17 ENCOUNTER — Other Ambulatory Visit (INDEPENDENT_AMBULATORY_CARE_PROVIDER_SITE_OTHER): Payer: Self-pay

## 2017-12-17 DIAGNOSIS — M25552 Pain in left hip: Secondary | ICD-10-CM

## 2017-12-31 ENCOUNTER — Ambulatory Visit (INDEPENDENT_AMBULATORY_CARE_PROVIDER_SITE_OTHER): Payer: Self-pay

## 2017-12-31 ENCOUNTER — Encounter (INDEPENDENT_AMBULATORY_CARE_PROVIDER_SITE_OTHER): Payer: Self-pay | Admitting: Physical Medicine and Rehabilitation

## 2017-12-31 ENCOUNTER — Ambulatory Visit (INDEPENDENT_AMBULATORY_CARE_PROVIDER_SITE_OTHER): Payer: Medicare Other | Admitting: Physical Medicine and Rehabilitation

## 2017-12-31 DIAGNOSIS — M25552 Pain in left hip: Secondary | ICD-10-CM | POA: Diagnosis not present

## 2017-12-31 NOTE — Progress Notes (Signed)
Nathan Frazier - 67 y.o. male MRN [id number]  Date of birth: 01-Apr-1951  Office Visit Note: Visit Date: 12/31/2017 PCP: Nathan Koch, MD Referred by: Nathan Frazier, *  Subjective: Chief Complaint  Patient presents with  . Left Hip - Pain  . Left Ankle - Pain   HPI: Nathan Frazier is a 67 year old gentleman who comes in today at the request of Nathan Frazier for diagnostic and hopefully therapeutic anesthetic hip arthrogram.  The patient does have left hip and groin pain is been ongoing for 8 or 9 months.  He has x-ray showing extensive arthritis.  He has not had intra-articular injection.    ROS Otherwise per HPI.  Assessment & Plan: Visit Diagnoses:  1. Pain in left hip     Plan: Findings:  Diagnostic and therapeutic left anesthetic hip arthrogram.  Patient did have relief during the anesthetic phase of the injection.    Meds & Orders: No orders of the defined types were placed in this encounter.   Orders Placed This Encounter  Procedures  . Large Joint Inj: L hip joint  . XR C-ARM NO REPORT    Follow-up: Return for Nathan Frazier.   Procedures: Large Joint Inj: L hip joint on 12/31/2017 3:06 PM Indications: pain and diagnostic evaluation Details: 22 G needle, anterior approach  Arthrogram: Yes  Medications: 80 mg triamcinolone acetonide 40 MG/ML; 3 mL bupivacaine 0.5 % Outcome: tolerated well, no immediate complications  Arthrogram demonstrated excellent flow of contrast throughout the joint surface without extravasation or obvious defect.  The patient had relief of symptoms during the anesthetic phase of the injection.  Procedure, treatment alternatives, risks and benefits explained, specific risks discussed. Consent was given by the patient. Immediately prior to procedure a time out was called to verify the correct patient, procedure, equipment, support staff and site/side marked as required. Patient was prepped and draped in the usual sterile fashion.       No notes on file   Clinical History: No specialty comments available.  He reports that  has never smoked. he has never used smokeless tobacco.  Recent Labs    09/11/17 12/10/17  HGBA1C 6.6 6.9    Objective:  VS:  HT:    WT:   BMI:     BP:   HR: bpm  TEMP: ( )  RESP:  Physical Exam  Ortho Exam Imaging: No results found.  Past Medical/Family/Surgical/Social History: Medications & Allergies reviewed per EMR Patient Active Problem List   Diagnosis Date Noted  . Left hip pain 11/26/2017  . Routine health maintenance 02/05/2013  . OA (osteoarthritis of the spine) 02/05/2013  . OA (osteoarthritis) of finger 02/05/2013  . ALLERGIC RHINITIS 03/08/2008  . Hyperlipidemia 03/06/2008  . Diabetes mellitus type 2, controlled, with complications ([place]) 30/86/5784  . Essential hypertension 09/27/2007   Past Medical History:  Diagnosis Date  . Allergic rhinitis   . Arthritis   . Familial tremor   . Fracture    right thumb, right elbow  . Other and unspecified hyperlipidemia   . Special screening for malignant neoplasms, unspecified intestine   . Type II or unspecified type diabetes mellitus without mention of complication, not stated as uncontrolled   . Unspecified essential hypertension    Family History  Problem Relation Age of Onset  . Heart disease Maternal Grandfather   . Colon cancer Neg Hx    Past Surgical History:  Procedure Laterality Date  . THYROID CYST EXCISION  1983  Social History   Occupational History  . Occupation: Lobbyist  Tobacco Use  . Smoking status: Never Smoker  . Smokeless tobacco: Never Used  Substance and Sexual Activity  . Alcohol use: Yes    Alcohol/week: 0.5 oz    Types: 1 Standard drinks or equivalent per week  . Drug use: No  . Sexual activity: Yes    Partners: Female

## 2017-12-31 NOTE — Patient Instructions (Signed)

## 2017-12-31 NOTE — Progress Notes (Deleted)
Pt states "background" left hip pain that is on and off. Pt states pain radiates to left ankle. Pt states bourbon helps eases pain, but nothing makes it worse its just a pain that is there. -Dye Allergies.

## 2018-01-04 ENCOUNTER — Other Ambulatory Visit: Payer: Self-pay | Admitting: Internal Medicine

## 2018-01-18 ENCOUNTER — Ambulatory Visit (INDEPENDENT_AMBULATORY_CARE_PROVIDER_SITE_OTHER): Payer: Medicare Other | Admitting: Orthopaedic Surgery

## 2018-01-18 MED ORDER — TRIAMCINOLONE ACETONIDE 40 MG/ML IJ SUSP
80.0000 mg | INTRAMUSCULAR | Status: AC | PRN
  Administered 2017-12-31: 80 mg via INTRA_ARTICULAR

## 2018-01-18 MED ORDER — BUPIVACAINE HCL 0.5 % IJ SOLN
3.0000 mL | INTRAMUSCULAR | Status: AC | PRN
  Administered 2017-12-31: 3 mL via INTRA_ARTICULAR

## 2018-01-26 ENCOUNTER — Encounter (INDEPENDENT_AMBULATORY_CARE_PROVIDER_SITE_OTHER): Payer: Self-pay | Admitting: Orthopaedic Surgery

## 2018-01-26 ENCOUNTER — Ambulatory Visit (INDEPENDENT_AMBULATORY_CARE_PROVIDER_SITE_OTHER): Payer: Medicare Other | Admitting: Orthopaedic Surgery

## 2018-01-26 DIAGNOSIS — M1612 Unilateral primary osteoarthritis, left hip: Secondary | ICD-10-CM | POA: Diagnosis not present

## 2018-01-26 NOTE — Progress Notes (Signed)
Patient is here for follow-up after having an intra-articular steroid injection in his left hip to try to calm down his arthritic pain.  He has known severe end-stage arthritis of that hip.  He said the injection so far is taken the edge off of things and is doing well.  He said it did bump up his blood glucose but otherwise he is doing well.  On exam his left hip is still significantly stiff and has pain with internal/external rotation but he is tolerating a little bit better.  We did go over again his hip disease and talked about what hip replacement surgery involves.  We a long thorough discussion about the risk and benefits of the surgery as well as what his intraoperative and postoperative course involved.  I showed him a hip model and gave him a handout again about hip replacement surgery.  He knows not to have a steroid injection again on in this hip until least the summer unless he is having other difficulties at all.  Mother option would be for him is a total hip arthroplasty.  He understands this as well.  All questions and concerns were answered and addressed.  He will follow-up otherwise as needed.

## 2018-02-09 ENCOUNTER — Other Ambulatory Visit: Payer: Self-pay | Admitting: Internal Medicine

## 2018-02-13 ENCOUNTER — Other Ambulatory Visit: Payer: Self-pay | Admitting: Internal Medicine

## 2018-02-20 ENCOUNTER — Other Ambulatory Visit: Payer: Self-pay | Admitting: Internal Medicine

## 2018-03-04 DIAGNOSIS — E782 Mixed hyperlipidemia: Secondary | ICD-10-CM | POA: Diagnosis not present

## 2018-03-04 DIAGNOSIS — E1165 Type 2 diabetes mellitus with hyperglycemia: Secondary | ICD-10-CM | POA: Diagnosis not present

## 2018-03-11 DIAGNOSIS — Z7984 Long term (current) use of oral hypoglycemic drugs: Secondary | ICD-10-CM | POA: Diagnosis not present

## 2018-03-11 DIAGNOSIS — E1142 Type 2 diabetes mellitus with diabetic polyneuropathy: Secondary | ICD-10-CM | POA: Diagnosis not present

## 2018-03-11 DIAGNOSIS — E782 Mixed hyperlipidemia: Secondary | ICD-10-CM | POA: Diagnosis not present

## 2018-03-11 DIAGNOSIS — R809 Proteinuria, unspecified: Secondary | ICD-10-CM | POA: Diagnosis not present

## 2018-03-11 DIAGNOSIS — E1165 Type 2 diabetes mellitus with hyperglycemia: Secondary | ICD-10-CM | POA: Diagnosis not present

## 2018-03-11 DIAGNOSIS — I10 Essential (primary) hypertension: Secondary | ICD-10-CM | POA: Diagnosis not present

## 2018-03-25 ENCOUNTER — Other Ambulatory Visit: Payer: Self-pay | Admitting: Internal Medicine

## 2018-03-29 ENCOUNTER — Other Ambulatory Visit: Payer: Self-pay | Admitting: Internal Medicine

## 2018-04-15 ENCOUNTER — Other Ambulatory Visit: Payer: Self-pay | Admitting: Internal Medicine

## 2018-04-20 ENCOUNTER — Ambulatory Visit (INDEPENDENT_AMBULATORY_CARE_PROVIDER_SITE_OTHER): Payer: Medicare Other | Admitting: Internal Medicine

## 2018-04-20 ENCOUNTER — Encounter: Payer: Self-pay | Admitting: Internal Medicine

## 2018-04-20 DIAGNOSIS — E1169 Type 2 diabetes mellitus with other specified complication: Secondary | ICD-10-CM | POA: Diagnosis not present

## 2018-04-20 DIAGNOSIS — I10 Essential (primary) hypertension: Secondary | ICD-10-CM

## 2018-04-20 DIAGNOSIS — E785 Hyperlipidemia, unspecified: Secondary | ICD-10-CM

## 2018-04-20 MED ORDER — LISINOPRIL 20 MG PO TABS: 20.0000 mg | ORAL_TABLET | Freq: Every day | ORAL | 3 refills | Status: DC

## 2018-04-20 MED ORDER — ATORVASTATIN CALCIUM 10 MG PO TABS: ORAL_TABLET | ORAL | 3 refills | Status: DC

## 2018-04-20 NOTE — Progress Notes (Signed)
   Subjective:    Patient ID: Nathan Frazier, male    DOB: May 27, 1951, 67 y.o.   MRN: [id number]  HPI The patient is a 67 YO man coming in for follow up of cholesterol (still taking lipitor and recent cholesterol with endo was at goal LDL<100) as well as blood pressure (taking lisinopril 20 mg daily, BP at goal, denies side effects, denies headaches or chest pains). Overall doing well.   Review of Systems  Constitutional: Negative.   HENT: Negative.   Eyes: Negative.   Respiratory: Negative for cough, chest tightness and shortness of breath.   Cardiovascular: Negative for chest pain, palpitations and leg swelling.  Gastrointestinal: Negative for abdominal distention, abdominal pain, constipation, diarrhea, nausea and vomiting.  Musculoskeletal: Positive for arthralgias.       Hip pain  Skin: Negative.   Neurological: Negative.   Psychiatric/Behavioral: Negative.       Objective:   Physical Exam  Constitutional: He is oriented to person, place, and time. He appears well-developed and well-nourished.  HENT:  Head: Normocephalic and atraumatic.  Eyes: EOM are normal.  Neck: Normal range of motion.  Cardiovascular: Normal rate and regular rhythm.  Pulmonary/Chest: Effort normal and breath sounds normal. No respiratory distress. He has no wheezes. He has no rales.  Abdominal: Soft. Bowel sounds are normal. He exhibits no distension. There is no tenderness. There is no rebound.  Musculoskeletal: He exhibits no edema.  Neurological: He is alert and oriented to person, place, and time. Coordination normal.  Skin: Skin is warm and dry.  Psychiatric: He has a normal mood and affect.   Vitals:   04/20/18 1015  BP: 122/84  Pulse: (!) 58  Temp: 98.4 F (36.9 C)  TempSrc: Oral  SpO2: 97%  Weight: 182 lb (82.6 kg)  Height: 5\' 8"  (1.727 m)      Assessment & Plan:

## 2018-04-20 NOTE — Assessment & Plan Note (Signed)
Taking lipitor 10 mg and refilled today. Reviewed last lipid panel with him at visit and at goal.

## 2018-04-20 NOTE — Assessment & Plan Note (Signed)
BP at goal today and recent CMP without indication for change. Refill lisinopril 20 mg daily.

## 2018-04-20 NOTE — Patient Instructions (Signed)
We will send in the refills today for the medicines.

## 2018-05-10 DIAGNOSIS — E119 Type 2 diabetes mellitus without complications: Secondary | ICD-10-CM | POA: Diagnosis not present

## 2018-06-08 DIAGNOSIS — E782 Mixed hyperlipidemia: Secondary | ICD-10-CM | POA: Diagnosis not present

## 2018-06-08 DIAGNOSIS — E1165 Type 2 diabetes mellitus with hyperglycemia: Secondary | ICD-10-CM | POA: Diagnosis not present

## 2018-06-17 DIAGNOSIS — I10 Essential (primary) hypertension: Secondary | ICD-10-CM | POA: Diagnosis not present

## 2018-06-17 DIAGNOSIS — E663 Overweight: Secondary | ICD-10-CM | POA: Diagnosis not present

## 2018-06-17 DIAGNOSIS — E1142 Type 2 diabetes mellitus with diabetic polyneuropathy: Secondary | ICD-10-CM | POA: Diagnosis not present

## 2018-06-17 DIAGNOSIS — E782 Mixed hyperlipidemia: Secondary | ICD-10-CM | POA: Diagnosis not present

## 2018-06-17 DIAGNOSIS — Z7984 Long term (current) use of oral hypoglycemic drugs: Secondary | ICD-10-CM | POA: Diagnosis not present

## 2018-06-17 DIAGNOSIS — Z6828 Body mass index (BMI) 28.0-28.9, adult: Secondary | ICD-10-CM | POA: Diagnosis not present

## 2018-06-17 DIAGNOSIS — R809 Proteinuria, unspecified: Secondary | ICD-10-CM | POA: Diagnosis not present

## 2018-06-17 DIAGNOSIS — E1165 Type 2 diabetes mellitus with hyperglycemia: Secondary | ICD-10-CM | POA: Diagnosis not present

## 2018-07-12 ENCOUNTER — Telehealth (INDEPENDENT_AMBULATORY_CARE_PROVIDER_SITE_OTHER): Payer: Self-pay | Admitting: Orthopaedic Surgery

## 2018-07-12 NOTE — Telephone Encounter (Signed)
I spoke with the patient.  He says he will not be able to have the [place] until January, due to other life circumstances.  He is asking if he would be able to get another hip injection by Dr. Ernestina Patches if he needs it before that time.  The injection he got 1/03 lasted "a good while," but at this point has totally worn off. So, #1- can he call and let us know when he feels he's ready to get another injection and we just schedule it, and #2- Do you want/need to see him back before January for another check prior to surgery?

## 2018-07-12 NOTE — Telephone Encounter (Signed)
noted 

## 2018-07-12 NOTE — Telephone Encounter (Signed)
Since his last hip injection was in January, he can call anytime to schedule another hip injection.  I would definitely need to see him back before January surgery just to get it scheduled so he should call the office to be seen by me sometime in early December.

## 2018-07-12 NOTE — Telephone Encounter (Signed)
Appt sched  11/29/18

## 2018-07-12 NOTE — Telephone Encounter (Signed)
Patient called asked for a call back to discuss getting an injection and his surgery. The number to contact patient is [phone]

## 2018-07-12 NOTE — Telephone Encounter (Signed)
Advised patient of the plan.    Will you schedule an appt with Dr. Ninfa Linden early in December for a followup on left hip OA/schedule surgery (T, W, Th are best for the patient) and call/email the patient with the info?

## 2018-08-06 ENCOUNTER — Telehealth (INDEPENDENT_AMBULATORY_CARE_PROVIDER_SITE_OTHER): Payer: Self-pay | Admitting: *Deleted

## 2018-08-09 NOTE — Telephone Encounter (Signed)
Per Dr. Trevor Mace note it to be okay to repeat the injection.

## 2018-08-20 ENCOUNTER — Ambulatory Visit (INDEPENDENT_AMBULATORY_CARE_PROVIDER_SITE_OTHER): Payer: Medicare Other | Admitting: Physical Medicine and Rehabilitation

## 2018-08-20 ENCOUNTER — Ambulatory Visit (INDEPENDENT_AMBULATORY_CARE_PROVIDER_SITE_OTHER): Payer: Self-pay

## 2018-08-20 DIAGNOSIS — M25552 Pain in left hip: Secondary | ICD-10-CM

## 2018-08-20 NOTE — Progress Notes (Signed)
Nathan Frazier - 67 y.o. male MRN [id number]  Date of birth: [birth year]  Office Visit Note: Visit Date: 08/20/2018 PCP: Nathan Koch, MD Referred by: Nathan Frazier, *  Subjective: Chief Complaint  Patient presents with  . Left Hip - Pain   HPI: Nathan Frazier is a 67 year old gentleman who comes in today at the request of Nathan Frazier for repeat left intra-articular hip injection.  Diagnostic arthrogram performed in January gave 90% relief.  He reports pain 7-8 out of 10 which does affect his daily activities.  This is increased with certain movements reaching to put on his shoes etc.  Is actually hurting somewhat just sitting.  We will repeat the intra-articular injection today.   ROS Otherwise per HPI.  Assessment & Plan: Visit Diagnoses:  1. Pain in left hip     Plan: No additional findings.   Meds & Orders: No orders of the defined types were placed in this encounter.   Orders Placed This Encounter  Procedures  . Large Joint Inj: L hip joint  . XR C-ARM NO REPORT    Follow-up: Return if symptoms worsen or fail to improve, for Nathan Frazier.   Procedures: Large Joint Inj: L hip joint on 08/20/2018 9:26 AM Indications: diagnostic evaluation and pain Details: 22 G 3.5 in needle, fluoroscopy-guided anterior approach  Arthrogram: No  Medications: 80 mg triamcinolone acetonide 40 MG/ML; 3 mL bupivacaine 0.5 % Outcome: tolerated well, no immediate complications  There was excellent flow of contrast producing a partial arthrogram of the hip. The patient did have relief of symptoms during the anesthetic phase of the injection. Procedure, treatment alternatives, risks and benefits explained, specific risks discussed. Consent was given by the patient. Immediately prior to procedure a time out was called to verify the correct patient, procedure, equipment, support staff and site/side marked as required. Patient was prepped and draped in the usual sterile  fashion.      No notes on file   Clinical History: No specialty comments available.   He reports that he has never smoked. He has never used smokeless tobacco.  Recent Labs    09/11/17 12/10/17  HGBA1C 6.6 6.9    Objective:  VS:  HT:    WT:   BMI:     BP:   HR: bpm  TEMP: ( )  RESP:  Physical Exam  Ortho Exam Imaging: No results found.  Past Medical/Family/Surgical/Social History: Medications & Allergies reviewed per EMR, new medications updated. Patient Active Problem List   Diagnosis Date Noted  . Unilateral primary osteoarthritis, left hip 01/26/2018  . Routine health maintenance 02/05/2013  . OA (osteoarthritis of the spine) 02/05/2013  . OA (osteoarthritis) of finger 02/05/2013  . ALLERGIC RHINITIS 03/08/2008  . Hyperlipidemia associated with type 2 diabetes mellitus ([place]) 03/06/2008  . Diabetes mellitus type 2, controlled, with complications ([place]) 17/61/6073  . Essential hypertension 09/27/2007   Past Medical History:  Diagnosis Date  . Allergic rhinitis   . Arthritis   . Familial tremor   . Fracture    right thumb, right elbow  . Other and unspecified hyperlipidemia   . Special screening for malignant neoplasms, unspecified intestine   . Type II or unspecified type diabetes mellitus without mention of complication, not stated as uncontrolled   . Unspecified essential hypertension    Family History  Problem Relation Age of Onset  . Heart disease Maternal Grandfather   . Colon cancer Neg Hx    Past Surgical History:  Procedure Laterality Date  . THYROID CYST EXCISION  1983   Social History   Occupational History  . Occupation: Lobbyist  Tobacco Use  . Smoking status: Never Smoker  . Smokeless tobacco: Never Used  Substance and Sexual Activity  . Alcohol use: Yes    Alcohol/week: 1.0 standard drinks    Types: 1 Standard drinks or equivalent per week  . Drug use: No  . Sexual activity: Yes    Partners: Female

## 2018-08-20 NOTE — Patient Instructions (Signed)

## 2018-09-03 MED ORDER — TRIAMCINOLONE ACETONIDE 40 MG/ML IJ SUSP
80.0000 mg | INTRAMUSCULAR | Status: AC | PRN
  Administered 2018-08-20: 80 mg via INTRA_ARTICULAR

## 2018-09-03 MED ORDER — BUPIVACAINE HCL 0.5 % IJ SOLN
3.0000 mL | INTRAMUSCULAR | Status: AC | PRN
  Administered 2018-08-20: 3 mL via INTRA_ARTICULAR

## 2018-09-10 DIAGNOSIS — E1165 Type 2 diabetes mellitus with hyperglycemia: Secondary | ICD-10-CM | POA: Diagnosis not present

## 2018-09-10 DIAGNOSIS — E782 Mixed hyperlipidemia: Secondary | ICD-10-CM | POA: Diagnosis not present

## 2018-09-14 DIAGNOSIS — E1129 Type 2 diabetes mellitus with other diabetic kidney complication: Secondary | ICD-10-CM | POA: Diagnosis not present

## 2018-09-14 DIAGNOSIS — E1142 Type 2 diabetes mellitus with diabetic polyneuropathy: Secondary | ICD-10-CM | POA: Diagnosis not present

## 2018-09-14 DIAGNOSIS — R809 Proteinuria, unspecified: Secondary | ICD-10-CM | POA: Diagnosis not present

## 2018-09-14 DIAGNOSIS — E782 Mixed hyperlipidemia: Secondary | ICD-10-CM | POA: Diagnosis not present

## 2018-09-14 DIAGNOSIS — Z23 Encounter for immunization: Secondary | ICD-10-CM | POA: Diagnosis not present

## 2018-09-14 DIAGNOSIS — I10 Essential (primary) hypertension: Secondary | ICD-10-CM | POA: Diagnosis not present

## 2018-10-20 ENCOUNTER — Encounter: Payer: Self-pay | Admitting: Internal Medicine

## 2018-10-20 ENCOUNTER — Ambulatory Visit (INDEPENDENT_AMBULATORY_CARE_PROVIDER_SITE_OTHER): Payer: Medicare Other | Admitting: Internal Medicine

## 2018-10-20 VITALS — BP 112/80 | HR 70 | Temp 98.4°F | Ht 68.0 in | Wt 183.0 lb

## 2018-10-20 DIAGNOSIS — I10 Essential (primary) hypertension: Secondary | ICD-10-CM

## 2018-10-20 DIAGNOSIS — E785 Hyperlipidemia, unspecified: Secondary | ICD-10-CM | POA: Diagnosis not present

## 2018-10-20 DIAGNOSIS — M1612 Unilateral primary osteoarthritis, left hip: Secondary | ICD-10-CM | POA: Diagnosis not present

## 2018-10-20 DIAGNOSIS — E1169 Type 2 diabetes mellitus with other specified complication: Secondary | ICD-10-CM | POA: Diagnosis not present

## 2018-10-20 NOTE — Patient Instructions (Addendum)
seborrheic keratosis are the spots on the arms and feet    Check and see if the lipitor (atorvastatin) is 10 mg or 40 mg?

## 2018-10-20 NOTE — Progress Notes (Signed)
   Subjective:    Patient ID: Nathan Frazier, male    DOB: [birth year], 67 y.o.   MRN: [id number]  HPI The patient is a 67 YO man coming in for follow up of left hip pain (may need surgery in the near future, doing injections which have helped some but not enough, using tylenol for pain currently which helps some), and his cholesterol (recent labs with endocrinology and labs are stable, he is on lipitor but he is not sure about dosing, he thinks he may have 40 mg pills at home, we have 10 mg on our list), and his blood pressure (we decreased his lisinopril from 40 to 20 recently, doing well overall, denies headaches or chest pains, recent labs with endocrine without indication for change). Still active and playing golf some as well. He does skip diabetes meds when he plays golf due to having some low sugars with golf in the past.   Review of Systems  Constitutional: Negative.   HENT: Negative.   Eyes: Negative.   Respiratory: Negative for cough, chest tightness and shortness of breath.   Cardiovascular: Negative for chest pain, palpitations and leg swelling.  Gastrointestinal: Negative for abdominal distention, abdominal pain, constipation, diarrhea, nausea and vomiting.  Musculoskeletal: Positive for arthralgias. Negative for joint swelling.  Skin: Negative.   Neurological: Negative.   Psychiatric/Behavioral: Negative.       Objective:   Physical Exam  Constitutional: He is oriented to person, place, and time. He appears well-developed and well-nourished.  HENT:  Head: Normocephalic and atraumatic.  Eyes: EOM are normal.  Neck: Normal range of motion.  Cardiovascular: Normal rate and regular rhythm.  Pulmonary/Chest: Effort normal and breath sounds normal. No respiratory distress. He has no wheezes. He has no rales.  Abdominal: Soft. Bowel sounds are normal. He exhibits no distension. There is no tenderness. There is no rebound.  Musculoskeletal: He exhibits no edema.  Neurological: He  is alert and oriented to person, place, and time. Coordination normal.  Skin: Skin is warm and dry.  Psychiatric: He has a normal mood and affect.   Vitals:   10/20/18 0933  BP: 112/80  Pulse: 70  Temp: 98.4 F (36.9 C)  TempSrc: Oral  SpO2: 98%  Weight: 183 lb (83 kg)  Height: 5\' 8"  (1.727 m)      Assessment & Plan:

## 2018-10-21 ENCOUNTER — Ambulatory Visit: Payer: Medicare Other | Admitting: Internal Medicine

## 2018-10-21 NOTE — Assessment & Plan Note (Signed)
Is having more pain. May need some tramadol over the winter and we talked about risk and benefit of that today. He is using tylenol only for now and will let us know if alternative therapy is needed.

## 2018-10-21 NOTE — Assessment & Plan Note (Signed)
BP still at goal on lower dose lisinopril 20 mg daily. Recent BMP without indication for change. Refill as needed.

## 2018-10-21 NOTE — Assessment & Plan Note (Signed)
He will check on dosing of lipitor and if needed can increase. LDL close to goal on current therapy.

## 2018-10-22 ENCOUNTER — Encounter (INDEPENDENT_AMBULATORY_CARE_PROVIDER_SITE_OTHER): Payer: Self-pay | Admitting: Orthopaedic Surgery

## 2018-10-22 NOTE — Telephone Encounter (Signed)
Please call patient to reschedule December appointment to come in and see Dr. Ninfa Linden sooner.  Thanks!

## 2018-11-02 ENCOUNTER — Ambulatory Visit (INDEPENDENT_AMBULATORY_CARE_PROVIDER_SITE_OTHER): Payer: Medicare Other

## 2018-11-02 ENCOUNTER — Encounter (INDEPENDENT_AMBULATORY_CARE_PROVIDER_SITE_OTHER): Payer: Self-pay | Admitting: Orthopaedic Surgery

## 2018-11-02 ENCOUNTER — Ambulatory Visit (INDEPENDENT_AMBULATORY_CARE_PROVIDER_SITE_OTHER): Payer: Medicare Other | Admitting: Orthopaedic Surgery

## 2018-11-02 DIAGNOSIS — M1612 Unilateral primary osteoarthritis, left hip: Secondary | ICD-10-CM

## 2018-11-02 DIAGNOSIS — M25552 Pain in left hip: Secondary | ICD-10-CM

## 2018-11-02 NOTE — Progress Notes (Signed)
The patient someone I seen for now over a year.  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  His x-rays show complete loss of the joint space of the left side.  There is periarticular osteophytes and cystic changes as well as sclerotic changes.  He has had 2 intra-articular steroid injections now in the left hip and is gotten where that does not help.  He is work on activity modification.  He is a diabetic but he has a hemoglobin A1c of 6.7.  His pain is daily and it is 10 out of 10.  He cannot sleep well due to the pain.  His left hip pain is detrimentally affect is active daily living, his wife and his mobility.  Again is been dealing with this with well over a year now and we have treated him conservatively for over that period of time.  On exam his right hip moves normally his left hip has significant stiffness with internal/external rotation and this causes severe pain.  His x-rays do correlate with end-stage arthritis of his left hip.  This point he does wish to proceed with hip replacement surgery.  We went over hip model explained in detail what the surgery involves.  We had a long thorough discussion about the risk and benefits of the surgery including talking about his intraoperative and postoperative course.  He does wish to have this scheduled as soon as he can.  All question concerns were answered and addressed.

## 2018-11-04 ENCOUNTER — Other Ambulatory Visit (INDEPENDENT_AMBULATORY_CARE_PROVIDER_SITE_OTHER): Payer: Self-pay | Admitting: Physician Assistant

## 2018-11-08 ENCOUNTER — Ambulatory Visit (INDEPENDENT_AMBULATORY_CARE_PROVIDER_SITE_OTHER): Payer: Medicare Other | Admitting: Orthopaedic Surgery

## 2018-11-08 NOTE — Patient Instructions (Addendum)
Nathan Frazier  11/08/2018   Your procedure is scheduled on: Friday 11/19/2018  Report to [facility] Main  Entrance              Report to admitting at  [place]  AM     Call this number if you have problems the morning of surgery [phone]    How to Manage Your Diabetes Before and After Surgery  Why is it important to control my blood sugar before and after surgery? . Improving blood sugar levels before and after surgery helps healing and can limit problems. . A way of improving blood sugar control is eating a healthy diet by: o  Eating less sugar and carbohydrates o  Increasing activity/exercise o  Talking with your doctor about reaching your blood sugar goals . High blood sugars (greater than 180 mg/dL) can raise your risk of infections and slow your recovery, so you will need to focus on controlling your diabetes during the weeks before surgery. . Make sure that the doctor who takes care of your diabetes knows about your planned surgery including the date and location.  How do I manage my blood sugar before surgery? . Check your blood sugar at least 4 times a day, starting 2 days before surgery, to make sure that the level is not too high or low. o Check your blood sugar the morning of your surgery when you wake up and every 2 hours until you get to the Short Stay unit. . If your blood sugar is less than 70 mg/dL, you will need to treat for low blood sugar: o Do not take insulin. o Treat a low blood sugar (less than 70 mg/dL) with  cup of clear juice (cranberry or apple), 4 glucose tablets, OR glucose gel. o Recheck blood sugar in 15 minutes after treatment (to make sure it is greater than 70 mg/dL). If your blood sugar is not greater than 70 mg/dL on recheck, call [phone] for further instructions. . Report your blood sugar to the short stay nurse when you get to Short Stay.  . If you are admitted to the hospital after surgery: o Your blood sugar  will be checked by the staff and you will probably be given insulin after surgery (instead of oral diabetes medicines) to make sure you have good blood sugar levels. o The goal for blood sugar control after surgery is 80-180 mg/dL.   WHAT DO I DO ABOUT MY DIABETES MEDICATION?       Take the day before surgery, Actos as usual.       Take the day before surgery, Invokamet  as usual.       . Do not take oral diabetes medicines (pills) the morning of surgery.   . The day of surgery, do not take other diabetes injectables, including Byetta (exenatide), Bydureon (exenatide ER), Victoza (liraglutide), or Trulicity (dulaglutide).    Remember: Do not eat food or drink liquids :After Midnight.               BRUSH YOUR TEETH MORNING OF SURGERY AND RINSE YOUR MOUTH OUT, NO CHEWING GUM CANDY OR MINTS.     Take these medicines the morning of surgery with A SIP OF WATER: Use Systane eye drops if needed, Atorvastatin (Lipitor)              DO NOT TAKE ANY DIABETIC MEDICATIONS DAY OF YOUR SURGERY!  You may not have any metal on your body including hair pins and              piercings  Do not wear jewelry, make-up, lotions, powders or perfumes, deodorant             Men may shave face and neck.   Do not bring valuables to the hospital. [place].  Contacts, dentures or bridgework may not be worn into surgery.  Leave suitcase in the car. After surgery it may be brought to your room.                  Please read over the following fact sheets you were given: _____________________________________________________________________             [facility] - Preparing for Surgery Before surgery, you can play an important role.  Because skin is not sterile, your skin needs to be as free of germs as possible.  You can reduce the number of germs on your skin by washing with CHG (chlorahexidine gluconate) soap before surgery.   CHG is an antiseptic cleaner which kills germs and bonds with the skin to continue killing germs even after washing. Please DO NOT use if you have an allergy to CHG or antibacterial soaps.  If your skin becomes reddened/irritated stop using the CHG and inform your nurse when you arrive at Short Stay. Do not shave (including legs and underarms) for at least 48 hours prior to the first CHG shower.  You may shave your face/neck. Please follow these instructions carefully:  1.  Shower with CHG Soap the night before surgery and the  morning of Surgery.  2.  If you choose to wash your hair, wash your hair first as usual with your  normal  shampoo.  3.  After you shampoo, rinse your hair and body thoroughly to remove the  shampoo.                           4.  Use CHG as you would any other liquid soap.  You can apply chg directly  to the skin and wash                       Gently with a scrungie or clean washcloth.  5.  Apply the CHG Soap to your body ONLY FROM THE NECK DOWN.   Do not use on face/ open                           Wound or open sores. Avoid contact with eyes, ears mouth and genitals (private parts).                       Wash face,  Genitals (private parts) with your normal soap.             6.  Wash thoroughly, paying special attention to the area where your surgery  will be performed.  7.  Thoroughly rinse your body with warm water from the neck down.  8.  DO NOT shower/wash with your normal soap after using and rinsing off  the CHG Soap.                9.  Pat yourself dry with  a clean towel.            10.  Wear clean pajamas.            11.  Place clean sheets on your bed the night of your first shower and do not  sleep with pets. Day of Surgery : Do not apply any lotions/deodorants the morning of surgery.  Please wear clean clothes to the hospital/surgery center.  FAILURE TO FOLLOW THESE INSTRUCTIONS MAY RESULT IN THE CANCELLATION OF YOUR SURGERY PATIENT  SIGNATURE_________________________________  NURSE SIGNATURE__________________________________  ________________________________________________________________________   Adam Phenix  An incentive spirometer is a tool that can help keep your lungs clear and active. This tool measures how well you are filling your lungs with each breath. Taking long deep breaths may help reverse or decrease the chance of developing breathing (pulmonary) problems (especially infection) following:  A long period of time when you are unable to move or be active. BEFORE THE PROCEDURE   If the spirometer includes an indicator to show your best effort, your nurse or respiratory therapist will set it to a desired goal.  If possible, sit up straight or lean slightly forward. Try not to slouch.  Hold the incentive spirometer in an upright position. INSTRUCTIONS FOR USE  1. Sit on the edge of your bed if possible, or sit up as far as you can in bed or on a chair. 2. Hold the incentive spirometer in an upright position. 3. Breathe out normally. 4. Place the mouthpiece in your mouth and seal your lips tightly around it. 5. Breathe in slowly and as deeply as possible, raising the piston or the ball toward the top of the column. 6. Hold your breath for 3-5 seconds or for as long as possible. Allow the piston or ball to fall to the bottom of the column. 7. Remove the mouthpiece from your mouth and breathe out normally. 8. Rest for a few seconds and repeat Steps 1 through 7 at least 10 times every 1-2 hours when you are awake. Take your time and take a few normal breaths between deep breaths. 9. The spirometer may include an indicator to show your best effort. Use the indicator as a goal to work toward during each repetition. 10. After each set of 10 deep breaths, practice coughing to be sure your lungs are clear. If you have an incision (the cut made at the time of surgery), support your incision when coughing  by placing a pillow or rolled up towels firmly against it. Once you are able to get out of bed, walk around indoors and cough well. You may stop using the incentive spirometer when instructed by your caregiver.  RISKS AND COMPLICATIONS  Take your time so you do not get dizzy or light-headed.  If you are in pain, you may need to take or ask for pain medication before doing incentive spirometry. It is harder to take a deep breath if you are having pain. AFTER USE  Rest and breathe slowly and easily.  It can be helpful to keep track of a log of your progress. Your caregiver can provide you with a simple table to help with this. If you are using the spirometer at home, follow these instructions: [place] IF:   You are having difficultly using the spirometer.  You have trouble using the spirometer as often as instructed.  Your pain medication is not giving enough relief while using the spirometer.  You develop fever of 100.5 F (38.1  C) or higher. SEEK IMMEDIATE MEDICAL CARE IF:   You cough up bloody sputum that had not been present before.  You develop fever of 102 F (38.9 C) or greater.  You develop worsening pain at or near the incision site. MAKE SURE YOU:   Understand these instructions.  Will watch your condition.  Will get help right away if you are not doing well or get worse. Document Released: 04/27/2007 Document Revised: 03/08/2012 Document Reviewed: 06/28/2007 [facility] Patient Information 2014 [place], [place].   ________________________________________________________________________

## 2018-11-12 ENCOUNTER — Other Ambulatory Visit: Payer: Self-pay | Admitting: Orthopaedic Surgery

## 2018-11-12 ENCOUNTER — Encounter (HOSPITAL_COMMUNITY)
Admission: RE | Admit: 2018-11-12 | Discharge: 2018-11-12 | Disposition: A | Discharge status: Home / Self Care | Payer: Medicare Other | Source: Ambulatory Visit | Attending: Orthopaedic Surgery | Admitting: Orthopaedic Surgery

## 2018-11-12 ENCOUNTER — Encounter (HOSPITAL_COMMUNITY): Payer: Self-pay

## 2018-11-12 ENCOUNTER — Other Ambulatory Visit: Payer: Self-pay

## 2018-11-12 DIAGNOSIS — Z01818 Encounter for other preprocedural examination: Secondary | ICD-10-CM | POA: Diagnosis not present

## 2018-11-12 DIAGNOSIS — M1612 Unilateral primary osteoarthritis, left hip: Secondary | ICD-10-CM | POA: Diagnosis not present

## 2018-11-12 DIAGNOSIS — E119 Type 2 diabetes mellitus without complications: Secondary | ICD-10-CM | POA: Insufficient documentation

## 2018-11-12 LAB — BASIC METABOLIC PANEL
Anion gap: 9 (ref 5–15)
BUN: 16 mg/dL (ref 8–23)
CALCIUM: 9.7 mg/dL (ref 8.9–10.3)
CHLORIDE: 106 mmol/L (ref 98–111)
CO2: 26 mmol/L (ref 22–32)
CREATININE: 1.03 mg/dL (ref 0.61–1.24)
GFR calc Af Amer: >60 mL/min (ref >60)
GFR calc non Af Amer: >60 mL/min (ref >60)
GLUCOSE: 97 mg/dL (ref 70–99)
Potassium: 4.5 mmol/L (ref 3.5–5.1)
Sodium: 141 mmol/L (ref 135–145)

## 2018-11-12 LAB — CBC
HEMATOCRIT: 52.1 % — AB (ref 39.0–52.0)
Hemoglobin: 17.1 g/dL — ABNORMAL HIGH (ref 13.0–17.0)
MCH: 30.9 pg (ref 26.0–34.0)
MCHC: 32.8 g/dL (ref 30.0–36.0)
MCV: 94 fL (ref 80.0–100.0)
PLATELETS: 148 10*3/uL — AB (ref 150–400)
RBC: 5.54 MIL/uL (ref 4.22–5.81)
RDW: 13.4 % (ref 11.5–15.5)
WBC: 5.4 10*3/uL (ref 4.0–10.5)
nRBC: 0 % (ref 0.0–0.2)

## 2018-11-12 LAB — HEMOGLOBIN A1C
HEMOGLOBIN A1C: 6.4 % — AB (ref 4.8–5.6)
Mean Plasma Glucose: 136.98 mg/dL

## 2018-11-12 LAB — SURGICAL PCR SCREEN
MRSA, PCR: NEGATIVE
STAPHYLOCOCCUS AUREUS: NEGATIVE

## 2018-11-12 LAB — GLUCOSE, CAPILLARY: Glucose-Capillary: 109 mg/dL — ABNORMAL HIGH (ref 70–99)

## 2018-11-12 NOTE — Progress Notes (Signed)
   11/12/18 0914  OBSTRUCTIVE SLEEP APNEA  Have you ever been diagnosed with sleep apnea through a sleep study? No  Do you snore loudly (loud enough to be heard through closed doors)?  1  Do you often feel tired, fatigued, or sleepy during the daytime (such as falling asleep during driving or talking to someone)? 1  Has anyone observed you stop breathing during your sleep? 0  Do you have, or are you being treated for high blood pressure? 1  BMI more than 35 kg/m2? 0  Age > 50 (1-yes) 1  Neck circumference greater than:Male 16 inches or larger, Male 17inches or larger? 1  Male Gender (Yes=1) 1  Obstructive Sleep Apnea Score 6  Score 5 or greater  Results sent to PCP

## 2018-11-12 NOTE — Care Plan (Signed)
Spoke with patient and wife. Will discharge to home with HHPT and family. Equipment ordered will need to be delivered to hospital. Questions answered.   Ladell Heads, [place]

## 2018-11-16 ENCOUNTER — Other Ambulatory Visit: Payer: Self-pay

## 2018-11-18 ENCOUNTER — Telehealth (INDEPENDENT_AMBULATORY_CARE_PROVIDER_SITE_OTHER): Payer: Self-pay | Admitting: Orthopaedic Surgery

## 2018-11-18 NOTE — Telephone Encounter (Signed)
Patient left a message stating that he needed to speak to the person that is going to help him after surgery.  CB#[phone].  Thank you.

## 2018-11-18 NOTE — Telephone Encounter (Signed)
Patient states he already talked to [facility]

## 2018-11-19 ENCOUNTER — Inpatient Hospital Stay (HOSPITAL_COMMUNITY): Payer: Medicare Other | Admitting: Anesthesiology

## 2018-11-19 ENCOUNTER — Inpatient Hospital Stay (HOSPITAL_COMMUNITY): Payer: Medicare Other

## 2018-11-19 ENCOUNTER — Other Ambulatory Visit: Payer: Self-pay

## 2018-11-19 ENCOUNTER — Inpatient Hospital Stay (HOSPITAL_COMMUNITY)
Admission: RE | Admit: 2018-11-19 | Discharge: 2018-11-21 | DRG: 470 | Disposition: A | Discharge status: Home Health | Payer: Medicare Other | Source: Ambulatory Visit | Attending: Orthopaedic Surgery | Admitting: Orthopaedic Surgery

## 2018-11-19 ENCOUNTER — Encounter (HOSPITAL_COMMUNITY)
Admission: RE | Disposition: A | Discharge status: Home Health | Payer: Self-pay | Source: Ambulatory Visit | Attending: Orthopaedic Surgery

## 2018-11-19 ENCOUNTER — Encounter (HOSPITAL_COMMUNITY): Payer: Self-pay | Admitting: *Deleted

## 2018-11-19 DIAGNOSIS — Z79899 Other long term (current) drug therapy: Secondary | ICD-10-CM

## 2018-11-19 DIAGNOSIS — I10 Essential (primary) hypertension: Secondary | ICD-10-CM | POA: Diagnosis not present

## 2018-11-19 DIAGNOSIS — Z7984 Long term (current) use of oral hypoglycemic drugs: Secondary | ICD-10-CM | POA: Diagnosis not present

## 2018-11-19 DIAGNOSIS — Z8249 Family history of ischemic heart disease and other diseases of the circulatory system: Secondary | ICD-10-CM | POA: Diagnosis not present

## 2018-11-19 DIAGNOSIS — Z471 Aftercare following joint replacement surgery: Secondary | ICD-10-CM | POA: Diagnosis not present

## 2018-11-19 DIAGNOSIS — E1169 Type 2 diabetes mellitus with other specified complication: Secondary | ICD-10-CM | POA: Diagnosis not present

## 2018-11-19 DIAGNOSIS — Z7982 Long term (current) use of aspirin: Secondary | ICD-10-CM

## 2018-11-19 DIAGNOSIS — J309 Allergic rhinitis, unspecified: Secondary | ICD-10-CM | POA: Diagnosis present

## 2018-11-19 DIAGNOSIS — E785 Hyperlipidemia, unspecified: Secondary | ICD-10-CM | POA: Diagnosis present

## 2018-11-19 DIAGNOSIS — Z96642 Presence of left artificial hip joint: Secondary | ICD-10-CM | POA: Diagnosis not present

## 2018-11-19 DIAGNOSIS — Z419 Encounter for procedure for purposes other than remedying health state, unspecified: Secondary | ICD-10-CM

## 2018-11-19 DIAGNOSIS — M1612 Unilateral primary osteoarthritis, left hip: Principal | ICD-10-CM | POA: Diagnosis present

## 2018-11-19 DIAGNOSIS — Z888 Allergy status to other drugs, medicaments and biological substances status: Secondary | ICD-10-CM

## 2018-11-19 DIAGNOSIS — E119 Type 2 diabetes mellitus without complications: Secondary | ICD-10-CM | POA: Diagnosis not present

## 2018-11-19 HISTORY — PX: TOTAL HIP ARTHROPLASTY: SHX124

## 2018-11-19 LAB — GLUCOSE, CAPILLARY
GLUCOSE-CAPILLARY: 240 mg/dL — AB (ref 70–99)
Glucose-Capillary: 132 mg/dL — ABNORMAL HIGH (ref 70–99)
Glucose-Capillary: 151 mg/dL — ABNORMAL HIGH (ref 70–99)

## 2018-11-19 SURGERY — ARTHROPLASTY, HIP, TOTAL, ANTERIOR APPROACH
Anesthesia: Spinal | Site: Hip | Laterality: Left

## 2018-11-19 MED ORDER — OXYCODONE HCL 5 MG PO TABS
5.0000 mg | ORAL_TABLET | Freq: Once | ORAL | Status: AC | PRN
  Administered 2018-11-19: 5 mg via ORAL

## 2018-11-19 MED ORDER — FENTANYL CITRATE (PF) 100 MCG/2ML IJ SOLN
INTRAMUSCULAR | Status: AC
  Filled 2018-11-19: qty 2

## 2018-11-19 MED ORDER — ALUM & MAG HYDROXIDE-SIMETH 200-200-20 MG/5ML PO SUSP: 30.0000 mL | ORAL | Status: DC | PRN

## 2018-11-19 MED ORDER — DIPHENHYDRAMINE HCL 12.5 MG/5ML PO ELIX: 12.5000 mg | ORAL_SOLUTION | ORAL | Status: DC | PRN

## 2018-11-19 MED ORDER — PIOGLITAZONE HCL 15 MG PO TABS
15.0000 mg | ORAL_TABLET | Freq: Every day | ORAL | Status: DC
  Administered 2018-11-20 – 2018-11-21 (×2): 15 mg via ORAL
  Filled 2018-11-19 (×2): qty 1

## 2018-11-19 MED ORDER — POLYETHYLENE GLYCOL 3350 17 G PO PACK: 17.0000 g | PACK | Freq: Every day | ORAL | Status: DC | PRN

## 2018-11-19 MED ORDER — SODIUM CHLORIDE 0.9 % IV SOLN
INTRAVENOUS | Status: DC
  Administered 2018-11-19 – 2018-11-20 (×2): via INTRAVENOUS

## 2018-11-19 MED ORDER — TRANEXAMIC ACID-NACL 1000-0.7 MG/100ML-% IV SOLN
1000.0000 mg | INTRAVENOUS | Status: AC
  Administered 2018-11-19: 1000 mg via INTRAVENOUS
  Filled 2018-11-19: qty 100

## 2018-11-19 MED ORDER — PHENYLEPHRINE 40 MCG/ML (10ML) SYRINGE FOR IV PUSH (FOR BLOOD PRESSURE SUPPORT)
PREFILLED_SYRINGE | INTRAVENOUS | Status: AC
  Filled 2018-11-19: qty 10

## 2018-11-19 MED ORDER — OXYCODONE HCL 5 MG PO TABS
10.0000 mg | ORAL_TABLET | ORAL | Status: DC | PRN
  Filled 2018-11-19: qty 3

## 2018-11-19 MED ORDER — ASPIRIN 81 MG PO CHEW
81.0000 mg | CHEWABLE_TABLET | Freq: Two times a day (BID) | ORAL | Status: DC
  Administered 2018-11-19 – 2018-11-21 (×4): 81 mg via ORAL
  Filled 2018-11-19 (×4): qty 1

## 2018-11-19 MED ORDER — MIDAZOLAM HCL 2 MG/2ML IJ SOLN
INTRAMUSCULAR | Status: AC
  Filled 2018-11-19: qty 2

## 2018-11-19 MED ORDER — ATORVASTATIN CALCIUM 40 MG PO TABS
40.0000 mg | ORAL_TABLET | Freq: Every day | ORAL | Status: DC
  Administered 2018-11-20 – 2018-11-21 (×2): 40 mg via ORAL
  Filled 2018-11-19 (×2): qty 1

## 2018-11-19 MED ORDER — PHENOL 1.4 % MT LIQD
1.0000 | OROMUCOSAL | Status: DC | PRN
  Filled 2018-11-19: qty 177

## 2018-11-19 MED ORDER — METFORMIN HCL ER 500 MG PO TB24
2000.0000 mg | ORAL_TABLET | Freq: Every day | ORAL | Status: DC
  Filled 2018-11-19: qty 4

## 2018-11-19 MED ORDER — CHLORHEXIDINE GLUCONATE 4 % EX LIQD: 60.0000 mL | Freq: Once | CUTANEOUS | Status: DC

## 2018-11-19 MED ORDER — MIDAZOLAM HCL 5 MG/5ML IJ SOLN
INTRAMUSCULAR | Status: DC | PRN
  Administered 2018-11-19: 2 mg via INTRAVENOUS

## 2018-11-19 MED ORDER — CEFAZOLIN SODIUM-DEXTROSE 2-4 GM/100ML-% IV SOLN
2.0000 g | INTRAVENOUS | Status: AC
  Administered 2018-11-19: 2 g via INTRAVENOUS
  Filled 2018-11-19: qty 100

## 2018-11-19 MED ORDER — MENTHOL 3 MG MT LOZG: 1.0000 | LOZENGE | OROMUCOSAL | Status: DC | PRN

## 2018-11-19 MED ORDER — PANTOPRAZOLE SODIUM 40 MG PO TBEC
40.0000 mg | DELAYED_RELEASE_TABLET | Freq: Every day | ORAL | Status: DC
  Administered 2018-11-19: 40 mg via ORAL
  Filled 2018-11-19 (×2): qty 1

## 2018-11-19 MED ORDER — ONDANSETRON HCL 4 MG/2ML IJ SOLN
INTRAMUSCULAR | Status: DC | PRN
  Administered 2018-11-19: 4 mg via INTRAVENOUS

## 2018-11-19 MED ORDER — LIRAGLUTIDE 18 MG/3ML ~~LOC~~ SOPN
1.2000 mg | PEN_INJECTOR | Freq: Every day | SUBCUTANEOUS | Status: DC
  Filled 2018-11-19: qty 3

## 2018-11-19 MED ORDER — METOCLOPRAMIDE HCL 5 MG PO TABS: 5.0000 mg | ORAL_TABLET | Freq: Three times a day (TID) | ORAL | Status: DC | PRN

## 2018-11-19 MED ORDER — FENTANYL CITRATE (PF) 100 MCG/2ML IJ SOLN
INTRAMUSCULAR | Status: DC | PRN
  Administered 2018-11-19: 25 ug via INTRAVENOUS
  Administered 2018-11-19: 50 ug via INTRAVENOUS
  Administered 2018-11-19: 75 ug via INTRAVENOUS

## 2018-11-19 MED ORDER — CANAGLIFLOZIN-METFORMIN HCL ER 150-1000 MG PO TB24: 2.0000 | ORAL_TABLET | Freq: Every day | ORAL | Status: DC

## 2018-11-19 MED ORDER — ACETAMINOPHEN 325 MG PO TABS
325.0000 mg | ORAL_TABLET | Freq: Four times a day (QID) | ORAL | Status: DC | PRN
  Administered 2018-11-20 – 2018-11-21 (×2): 650 mg via ORAL
  Filled 2018-11-19 (×3): qty 2

## 2018-11-19 MED ORDER — OXYCODONE HCL 5 MG/5ML PO SOLN: 5.0000 mg | Freq: Once | ORAL | Status: AC | PRN

## 2018-11-19 MED ORDER — METOCLOPRAMIDE HCL 5 MG/ML IJ SOLN: 5.0000 mg | Freq: Three times a day (TID) | INTRAMUSCULAR | Status: DC | PRN

## 2018-11-19 MED ORDER — OXYCODONE HCL 5 MG PO TABS
5.0000 mg | ORAL_TABLET | ORAL | Status: DC | PRN
  Administered 2018-11-19: 5 mg via ORAL
  Administered 2018-11-20: 10 mg via ORAL
  Filled 2018-11-19: qty 1
  Filled 2018-11-19: qty 2

## 2018-11-19 MED ORDER — DOCUSATE SODIUM 100 MG PO CAPS
100.0000 mg | ORAL_CAPSULE | Freq: Two times a day (BID) | ORAL | Status: DC
  Administered 2018-11-19 – 2018-11-21 (×3): 100 mg via ORAL
  Filled 2018-11-19 (×3): qty 1

## 2018-11-19 MED ORDER — LACTATED RINGERS IV SOLN
INTRAVENOUS | Status: DC
  Administered 2018-11-19 (×3): via INTRAVENOUS

## 2018-11-19 MED ORDER — DEXAMETHASONE SODIUM PHOSPHATE 10 MG/ML IJ SOLN
INTRAMUSCULAR | Status: DC | PRN
  Administered 2018-11-19: 10 mg via INTRAVENOUS

## 2018-11-19 MED ORDER — PROMETHAZINE HCL 25 MG/ML IJ SOLN: 6.2500 mg | INTRAMUSCULAR | Status: DC | PRN

## 2018-11-19 MED ORDER — ACETAMINOPHEN 10 MG/ML IV SOLN: 1000.0000 mg | Freq: Once | INTRAVENOUS | Status: DC | PRN

## 2018-11-19 MED ORDER — SODIUM CHLORIDE 0.9 % IR SOLN
Status: DC | PRN
  Administered 2018-11-19: 1000 mL

## 2018-11-19 MED ORDER — B COMPLEX PO TABS: 1.0000 | ORAL_TABLET | Freq: Every day | ORAL | Status: DC

## 2018-11-19 MED ORDER — ONDANSETRON HCL 4 MG PO TABS: 4.0000 mg | ORAL_TABLET | Freq: Four times a day (QID) | ORAL | Status: DC | PRN

## 2018-11-19 MED ORDER — OXYCODONE HCL 5 MG PO TABS
ORAL_TABLET | ORAL | Status: AC
  Filled 2018-11-19: qty 1

## 2018-11-19 MED ORDER — STERILE WATER FOR IRRIGATION IR SOLN
Status: DC | PRN
  Administered 2018-11-19: 2000 mL

## 2018-11-19 MED ORDER — PROPOFOL 500 MG/50ML IV EMUL
INTRAVENOUS | Status: DC | PRN
  Administered 2018-11-19: 75 ug/kg/min via INTRAVENOUS

## 2018-11-19 MED ORDER — B COMPLEX-C PO TABS
1.0000 | ORAL_TABLET | Freq: Every day | ORAL | Status: DC
  Administered 2018-11-20 – 2018-11-21 (×2): 1 via ORAL
  Filled 2018-11-19 (×2): qty 1

## 2018-11-19 MED ORDER — PROPOFOL 10 MG/ML IV BOLUS
INTRAVENOUS | Status: AC
  Filled 2018-11-19: qty 20

## 2018-11-19 MED ORDER — PROPOFOL 10 MG/ML IV BOLUS
INTRAVENOUS | Status: DC | PRN
  Administered 2018-11-19: 10 mg via INTRAVENOUS
  Administered 2018-11-19: 80 mg via INTRAVENOUS
  Administered 2018-11-19 (×2): 10 mg via INTRAVENOUS
  Administered 2018-11-19: 20 mg via INTRAVENOUS

## 2018-11-19 MED ORDER — METHOCARBAMOL 500 MG IVPB - SIMPLE MED
500.0000 mg | Freq: Four times a day (QID) | INTRAVENOUS | Status: DC | PRN
  Administered 2018-11-19: 500 mg via INTRAVENOUS
  Filled 2018-11-19: qty 50

## 2018-11-19 MED ORDER — DEXMEDETOMIDINE HCL IN NACL 200 MCG/50ML IV SOLN
INTRAVENOUS | Status: AC
  Filled 2018-11-19: qty 50

## 2018-11-19 MED ORDER — ONDANSETRON HCL 4 MG/2ML IJ SOLN: 4.0000 mg | Freq: Four times a day (QID) | INTRAMUSCULAR | Status: DC | PRN

## 2018-11-19 MED ORDER — FENTANYL CITRATE (PF) 100 MCG/2ML IJ SOLN
25.0000 ug | INTRAMUSCULAR | Status: DC | PRN
  Administered 2018-11-19 (×3): 50 ug via INTRAVENOUS

## 2018-11-19 MED ORDER — EPINEPHRINE PF 1 MG/ML IJ SOLN
INTRAMUSCULAR | Status: AC
  Filled 2018-11-19: qty 1

## 2018-11-19 MED ORDER — PROPOFOL 10 MG/ML IV BOLUS
INTRAVENOUS | Status: AC
  Filled 2018-11-19: qty 60

## 2018-11-19 MED ORDER — METHOCARBAMOL 500 MG IVPB - SIMPLE MED
INTRAVENOUS | Status: AC
  Filled 2018-11-19: qty 50

## 2018-11-19 MED ORDER — CANAGLIFLOZIN 100 MG PO TABS
300.0000 mg | ORAL_TABLET | Freq: Every day | ORAL | Status: DC
  Administered 2018-11-20 – 2018-11-21 (×2): 300 mg via ORAL
  Filled 2018-11-19 (×2): qty 3

## 2018-11-19 MED ORDER — METHOCARBAMOL 500 MG PO TABS
500.0000 mg | ORAL_TABLET | Freq: Four times a day (QID) | ORAL | Status: DC | PRN
  Administered 2018-11-20 – 2018-11-21 (×2): 500 mg via ORAL
  Filled 2018-11-19 (×2): qty 1

## 2018-11-19 MED ORDER — CEFAZOLIN SODIUM-DEXTROSE 1-4 GM/50ML-% IV SOLN
1.0000 g | Freq: Four times a day (QID) | INTRAVENOUS | Status: AC
  Administered 2018-11-19 – 2018-11-20 (×2): 1 g via INTRAVENOUS
  Filled 2018-11-19 (×2): qty 50

## 2018-11-19 MED ORDER — BUPIVACAINE IN DEXTROSE 0.75-8.25 % IT SOLN
INTRATHECAL | Status: DC | PRN
  Administered 2018-11-19: 1.8 mL via INTRATHECAL

## 2018-11-19 MED ORDER — ADULT MULTIVITAMIN W/MINERALS CH: 1.0000 | ORAL_TABLET | Freq: Every day | ORAL | Status: DC

## 2018-11-19 MED ORDER — HYDROMORPHONE HCL 1 MG/ML IJ SOLN: 0.5000 mg | INTRAMUSCULAR | Status: DC | PRN

## 2018-11-19 SURGICAL SUPPLY — 39 items
ARTICULEZE HEAD (Hips) ×2 IMPLANT
BAG ZIPLOCK 12X15 (MISCELLANEOUS) IMPLANT
BLADE SAW SGTL 18X1.27X75 (BLADE) ×2 IMPLANT
COVER PERINEAL POST (MISCELLANEOUS) ×2 IMPLANT
COVER SURGICAL LIGHT HANDLE (MISCELLANEOUS) ×2 IMPLANT
COVER WAND RF STERILE (DRAPES) IMPLANT
CUP ACET PNNCL SECTR W/GRIP 56 (Hips) ×1 IMPLANT
DRAPE STERI IOBAN 125X83 (DRAPES) ×2 IMPLANT
DRAPE U-SHAPE 47X51 STRL (DRAPES) ×4 IMPLANT
DRESSING AQUACEL AG SP 3.5X10 (GAUZE/BANDAGES/DRESSINGS) ×1 IMPLANT
DRSG AQUACEL AG SP 3.5X10 (GAUZE/BANDAGES/DRESSINGS) ×2
DURAPREP 26ML APPLICATOR (WOUND CARE) ×2 IMPLANT
ELECT REM PT RETURN 15FT ADLT (MISCELLANEOUS) ×2 IMPLANT
GAUZE XEROFORM 1X8 LF (GAUZE/BANDAGES/DRESSINGS) ×2 IMPLANT
GLOVE BIO SURGEON STRL SZ7.5 (GLOVE) ×4 IMPLANT
GLOVE BIOGEL PI IND STRL 7.5 (GLOVE) ×4 IMPLANT
GLOVE BIOGEL PI IND STRL 8 (GLOVE) ×2 IMPLANT
GLOVE BIOGEL PI INDICATOR 7.5 (GLOVE) ×4
GLOVE BIOGEL PI INDICATOR 8 (GLOVE) ×2
GLOVE ECLIPSE 8.0 STRL XLNG CF (GLOVE) ×2 IMPLANT
GLOVE SURG SS PI 7.0 STRL IVOR (GLOVE) ×2 IMPLANT
GOWN STRL REUS W/TWL LRG LVL3 (GOWN DISPOSABLE) ×2 IMPLANT
GOWN STRL REUS W/TWL XL LVL3 (GOWN DISPOSABLE) ×6 IMPLANT
HANDPIECE INTERPULSE COAX TIP (DISPOSABLE) ×2
HEAD ARTICULEZE (Hips) ×1 IMPLANT
HOLDER FOLEY CATH W/STRAP (MISCELLANEOUS) ×2 IMPLANT
LINER NEUTRAL 52MMX36MMX56N (Liner) ×2 IMPLANT
PACK ANTERIOR HIP CUSTOM (KITS) ×2 IMPLANT
PINN SECTOR W/GRIP ACE CUP 56 (Hips) ×2 IMPLANT
SET HNDPC FAN SPRY TIP SCT (DISPOSABLE) ×1 IMPLANT
STAPLER VISISTAT 35W (STAPLE) ×2 IMPLANT
STEM AMT HIGH CORAIL SZ13X135 (Stem) ×2 IMPLANT
SUT ETHIBOND NAB CT1 #1 30IN (SUTURE) ×2 IMPLANT
SUT VIC AB 0 CT1 36 (SUTURE) ×2 IMPLANT
SUT VIC AB 1 CT1 36 (SUTURE) ×2 IMPLANT
SUT VIC AB 2-0 CT1 27 (SUTURE) ×4
SUT VIC AB 2-0 CT1 TAPERPNT 27 (SUTURE) ×2 IMPLANT
TRAY FOLEY MTR SLVR 16FR STAT (SET/KITS/TRAYS/PACK) ×2 IMPLANT
YANKAUER SUCT BULB TIP 10FT TU (MISCELLANEOUS) ×2 IMPLANT

## 2018-11-19 NOTE — Anesthesia Postprocedure Evaluation (Signed)
Anesthesia Post Note  Patient: Nathan Frazier  Procedure(s) Performed: LEFT TOTAL HIP ARTHROPLASTY ANTERIOR APPROACH (Left Hip)     Patient location during evaluation: PACU Anesthesia Type: General Level of consciousness: awake and alert Pain management: pain level controlled Vital Signs Assessment: post-procedure vital signs reviewed and stable Respiratory status: spontaneous breathing, nonlabored ventilation and respiratory function stable Cardiovascular status: blood pressure returned to baseline and stable Postop Assessment: no apparent nausea or vomiting Anesthetic complications: no    Last Vitals:  Vitals:   11/19/18 1515 11/19/18 1530  BP: (!) 147/89 (!) 142/84  Pulse: 78 80  Resp: 15 (!) 21  Temp:    SpO2: 100% 100%    Last Pain:  Vitals:   11/19/18 1530  TempSrc:   PainSc: [place]

## 2018-11-19 NOTE — Op Note (Signed)
NAME: GARVIS, DOWNUM MEDICAL RECORD [id number] ACCOUNT [id number] DATE OF BIRTH:04/01/1951 FACILITY: [facility] LOCATION: [facility] PHYSICIAN:CHRISTOPHER Kerry Fort, MD  OPERATIVE REPORT  DATE OF PROCEDURE:  11/19/2018  PREOPERATIVE DIAGNOSIS:  Primary osteoarthritis and degenerative joint disease, left hip.  POSTOPERATIVE DIAGNOSIS:  Primary osteoarthritis and degenerative joint disease, left hip.  PROCEDURE:  Left total hip arthroplasty, direct anterior approach.  IMPLANTS:  DePuy Sector Gription acetabular component size 56, size 36+0 polyethylene liner, size 13 high-offset Corail femoral component, 36+5 metal hip ball.  SURGEON:  Lind Guest. Ninfa Linden, MD  ASSISTANT:  Erskine Emery, PA-C.  ANESTHESIA: 1.  Attempted spinal. 2.  General.  ANTIBIOTICS:  Two grams IV Ancef.  ESTIMATED BLOOD LOSS:  774 mL  COMPLICATIONS:  None.  INDICATIONS:  The patient is a very pleasant 67 year old gentleman with debilitating arthritis involving his left hip.  His x-rays show complete loss of the superior lateral joint space.  There are sclerotic changes in the femoral head and acetabulum and  periarticular osteophytes.  His pain is daily and at this point has detrimentally affected his mobility as well as activity of daily living and his quality of life.  He does wish to proceed with a total hip arthroplasty given the failure of conservative  treatment.  We went over his x-rays in detail and I explained what the surgery involves.  We discussed the risk of acute blood loss anemia, nerve or vessel injury, fracture, infection, dislocation, DVT and implant failure.  We have discussed the goals  of decreased pain, improved mobility and overall improved quality of life.  DESCRIPTION OF PROCEDURE:  After informed consent was obtained and appropriate left hip was marked.  He was brought to the operating room, sat up on a stretcher.  Spinal anesthesia was obtained, a Foley catheter was placed and both  feet had traction  boots applied to them.  Next, he was placed supine on the Hana fracture table with a perineal post in place and both legs in line skeletal traction device and no traction applied.  His left operative hip was prepped and draped with DuraPrep and sterile  drapes.  A time-out was called.  He was identified, correct patient, correct left hip.  I then pinched the skin.  He did not react to this.  Then we made an incision and he absolutely could feel this.  Even when we cauterized the vessel, thus, general  anesthesia was obtained via an LMA.  We then proceeded with the case.  We dissected down tensor fascia lata muscle.  Tensor fascia was then divided longitudinally to proceed with direct anterior approach to the hip.  We identified and cauterized  circumflex vessels.  I then identified the hip capsule, opened the hip capsule in an L-type format, finding moderate joint effusion and significant arthritis around his left hip.  We placed Cobra retractors around the medial and lateral femoral neck and  then made our femoral neck cut proximal to the lesser trochanter with an oscillating saw and completed this with an osteotome.  We placed a corkscrew guide in the femoral head and removed the head in its entirety and found a wide area devoid of  cartilage.  I then placed a bent Hohmann over the medial acetabular rim and removed remnants of the acetabular labrum and other debris.  I then began reaming under direct visualization in stepwise increments from a size 44 reamer up to a size 55 with all  reamers under direct visualization, the last reamer under direct  fluoroscopy, so I could obtain our depth of reaming my inclination and anteversion.  We then placed the real DePuy Sector Gription acetabular component size 56 and a 36+0 neutral  polyethylene liner for that size acetabular component.  Attention was then turned to the femur.  With the leg externally rotated to 120 degrees, extended and  adducted, we were able to place a Mueller retractor medially and a Hohmann retractor behind the  greater trochanter, released lateral joint capsule and used a box-cutting osteotome to enter femoral canal and a rongeur to lateralize.  We then began broaching from a size 8 broach using Corail broaching system going up to a size 13.  With a size 13 in  place, we trialed a standard offset femoral neck and a 36+1.5 hip ball, reduced this in the acetabulum.  We felt like we needed more offset and leg length.  Thus, we decided to go with a high offset femoral component.  We dislocated the hip and removed  the trial components.  We placed the real size 13 Corail femoral component with high offset and a 36+5 metal hip ball and reduced this in the acetabulum.  We were pleased with the leg length, range of motion, offset and stability assessed clinically and  radiographically.  We then irrigated the soft tissue with normal saline solution using pulsatile lavage.  We were able to close the joint capsule with interrupted #1 Ethibond suture, followed by running 0 Vicryl and tensor fascia, 0 Vicryl in the deep  tissue, 2-0 Vicryl subcutaneous tissue and interrupted staples on the skin.  Xeroform and Aquacel dressing was applied.  He was taken off the Hana table, awakened, extubated, and taken to recovery room in stable condition.  All final counts were correct.   There were no complications noted.  Of note, Benita Stabile, PA-C, assisted in the entire case.  His assistance was crucial for facilitating all aspects of this case.  TN/NUANCE  D:11/19/2018 T:11/19/2018 JOB:[id number]

## 2018-11-19 NOTE — Brief Op Note (Signed)
11/19/2018  2:29 PM  PATIENT:  Nathan Frazier  67 y.o. male  PRE-OPERATIVE DIAGNOSIS:  osteoarthritis left hip  POST-OPERATIVE DIAGNOSIS:  osteoarthritis left hip  PROCEDURE:  Procedure(s): LEFT TOTAL HIP ARTHROPLASTY ANTERIOR APPROACH (Left)  SURGEON:  Surgeon(s) and Role:    Mcarthur Rossetti, MD - Primary  PHYSICIAN ASSISTANT:  Benita Stabile, PA-C  ANESTHESIA:   spinal and general  EBL:  500 mL   COUNTS:  YES  TOURNIQUET:  * No tourniquets in log *  DICTATION: .Other Dictation: Dictation Number [id number]  PLAN OF CARE: Admit to inpatient   PATIENT DISPOSITION:  PACU - hemodynamically stable.   Delay start of Pharmacological VTE agent (>24hrs) due to surgical blood loss or risk of bleeding: no

## 2018-11-19 NOTE — Care Plan (Signed)
Ortho Bundle Case Management Note  Patient Details  Name: Nathan Frazier MRN: [id number] Date of Birth: 17-Jun-1951     Spoke with patient and wife. Will discharge to home with HHPT and family. Equipment ordered will need to be delivered to hospital. Questions answered.                 DME Arranged:  Bedside commode, Walker rolling DME Agency:  [employer]  HH Arranged:  PT [place] Agency:  [facility]  Additional Comments: Please contact me with any questions of if this plan should need to change.  Ladell Heads,  [place] [facility]  [phone] 11/19/2018, 9:33 AM

## 2018-11-19 NOTE — Transfer of Care (Signed)
Immediate Anesthesia Transfer of Care Note  Patient: Nathan Frazier  Procedure(s) Performed: Procedure(s) with comments: LEFT TOTAL HIP ARTHROPLASTY ANTERIOR APPROACH (Left) - Failed Spinal  Patient Location: PACU  Anesthesia Type:General  Level of Consciousness:  sedated, patient cooperative and responds to stimulation  Airway & Oxygen Therapy:Patient Spontanous Breathing and Patient connected to face mask oxgen  Post-op Assessment:  Report given to PACU RN and Post -op Vital signs reviewed and stable  Post vital signs:  Reviewed and stable  Last Vitals:  Vitals:   11/19/18 0956  BP: (!) 148/94  Pulse: (!) 57  Resp: 18  Temp: 36.6 C  SpO2: 95%    Complications: No apparent anesthesia complications

## 2018-11-19 NOTE — H&P (Signed)
TOTAL HIP ADMISSION H&P  Patient is admitted for left total hip arthroplasty.  Subjective:  Chief Complaint: left hip pain  HPI: Nathan Frazier, 66 y.o. male, has a history of pain and functional disability in the left hip(s) due to arthritis and patient has failed non-surgical conservative treatments for greater than 12 weeks to include NSAID's and/or analgesics, corticosteriod injections, flexibility and strengthening excercises, weight reduction as appropriate and activity modification.  Onset of symptoms was gradual starting 3 years ago with gradually worsening course since that time.The patient noted no past surgery on the left hip(s).  Patient currently rates pain in the left hip at 10 out of 10 with activity. Patient has night pain, worsening of pain with activity and weight bearing, trendelenberg gait, pain that interfers with activities of daily living, pain with passive range of motion and crepitus. Patient has evidence of subchondral cysts, subchondral sclerosis, periarticular osteophytes and joint space narrowing by imaging studies. This condition presents safety issues increasing the risk of falls.  There is no current active infection.  Patient Active Problem List   Diagnosis Date Noted  . Unilateral primary osteoarthritis, left hip 01/26/2018  . Routine health maintenance 02/05/2013  . OA (osteoarthritis of the spine) 02/05/2013  . OA (osteoarthritis) of finger 02/05/2013  . ALLERGIC RHINITIS 03/08/2008  . Hyperlipidemia associated with type 2 diabetes mellitus ([place]) 03/06/2008  . Diabetes mellitus type 2, controlled, with complications ([place]) 95/18/8416  . Essential hypertension 09/27/2007   Past Medical History:  Diagnosis Date  . Allergic rhinitis   . Arthritis   . Familial tremor   . Fracture    right thumb, right elbow  . Other and unspecified hyperlipidemia   . Special screening for malignant neoplasms, unspecified intestine   . Type II or unspecified type diabetes  mellitus without mention of complication, not stated as uncontrolled   . Unspecified essential hypertension     Past Surgical History:  Procedure Laterality Date  . THYROID CYST EXCISION  1983    No current facility-administered medications for this encounter.    Current Outpatient Medications  Medication Sig Dispense Refill Last Dose  . acetaminophen (TYLENOL) 500 MG tablet Take 1,000 mg by mouth daily as needed for moderate pain or headache.     Marland Kitchen aspirin EC 81 MG tablet Take 81 mg by mouth daily.     Marland Kitchen atorvastatin (LIPITOR) 40 MG tablet Take 40 mg by mouth daily.     Marland Kitchen b complex vitamins tablet Take 1 tablet by mouth daily.     . Canagliflozin-Metformin HCl ER (INVOKAMET XR) [phone] MG TB24 Take 2 tablets by mouth daily.    Taking  . Emollient (CETAPHIL) cream Apply 1 application topically as needed (dry skin).     . Liraglutide (VICTOZA) 18 MG/3ML SOPN Inject 1.2 mg into the skin daily.    Taking  . lisinopril (PRINIVIL,ZESTRIL) 20 MG tablet Take 1 tablet (20 mg total) by mouth daily. 90 tablet 3 Taking  . Multiple Vitamin (MULTIVITAMIN WITH MINERALS) TABS tablet Take 1 tablet by mouth daily.     Marland Kitchen OVER THE COUNTER MEDICATION Take 1 capsule by mouth daily. ALJ dietary supplement     . pioglitazone (ACTOS) 15 MG tablet Take 15 mg by mouth daily.   Taking  . Polyethyl Glycol-Propyl Glycol (SYSTANE OP) Place 1 drop into both eyes daily as needed (burning / irritation).     . Propylhexedrine (BENZEDREX NA) Place 1 Dose into both nostrils daily as needed (congestion).     Marland Kitchen  atorvastatin (LIPITOR) 10 MG tablet TAKE 1 TABLET (10 MG TOTAL) BY MOUTH DAILY. (Patient not taking: Reported on 11/08/2018) 90 tablet 3 Not Taking at Unknown time   Allergies  Allergen Reactions  . Metformin And Related     heartburn  . Pravastatin     confusion    Social History   Tobacco Use  . Smoking status: Never Smoker  . Smokeless tobacco: Never Used  Substance Use Topics  . Alcohol use: Yes     Alcohol/week: 1.0 standard drinks    Types: 1 Standard drinks or equivalent per week    Family History  Problem Relation Age of Onset  . Heart disease Maternal Grandfather   . Colon cancer Neg Hx      Review of Systems  Musculoskeletal: Positive for joint pain.  All other systems reviewed and are negative.   Objective:  Physical Exam  Constitutional: He is oriented to person, place, and time. He appears well-developed and well-nourished.  HENT:  Head: Normocephalic and atraumatic.  Eyes: Pupils are equal, round, and reactive to light. EOM are normal.  Neck: Normal range of motion. Neck supple.  Cardiovascular: Normal rate.  Respiratory: Effort normal and breath sounds normal.  GI: Soft. Bowel sounds are normal.  Musculoskeletal:       Left hip: He exhibits decreased range of motion, decreased strength, tenderness and bony tenderness.  Neurological: He is alert and oriented to person, place, and time.  Skin: Skin is warm and dry.  Psychiatric: He has a normal mood and affect.    Vital signs in last 24 hours:    Labs:   Estimated body mass index is 27.06 kg/m as calculated from the following:   Height as of 11/12/18: 5\' 8"  (1.727 m).   Weight as of 11/12/18: 80.7 kg.   Imaging Review Plain radiographs demonstrate severe degenerative joint disease of the left hip(s). The bone quality appears to be excellent for age and reported activity level.    Preoperative templating of the joint replacement has been completed, documented, and submitted to the Operating Room personnel in order to optimize intra-operative equipment management.     Assessment/Plan:  End stage arthritis, left hip(s)  The patient history, physical examination, clinical judgement of the provider and imaging studies are consistent with end stage degenerative joint disease of the left hip(s) and total hip arthroplasty is deemed medically necessary. The treatment options including medical management,  injection therapy, arthroscopy and arthroplasty were discussed at length. The risks and benefits of total hip arthroplasty were presented and reviewed. The risks due to aseptic loosening, infection, stiffness, dislocation/subluxation,  thromboembolic complications and other imponderables were discussed.  The patient acknowledged the explanation, agreed to proceed with the plan and consent was signed. Patient is being admitted for inpatient treatment for surgery, pain control, PT, OT, prophylactic antibiotics, VTE prophylaxis, progressive ambulation and ADL's and discharge planning.The patient is planning to be discharged home with home health services

## 2018-11-19 NOTE — Anesthesia Preprocedure Evaluation (Addendum)
Anesthesia Evaluation  Patient identified by MRN, date of birth, ID band Patient awake    Reviewed: Allergy & Precautions, NPO status , Patient's Chart, lab work & pertinent test results  History of Anesthesia Complications Negative for: history of anesthetic complications  Airway Mallampati: II  TM Distance: >3 FB Neck ROM: Full    Dental no notable dental hx. (+) Teeth Intact, Dental Advisory Given, Caps,    Pulmonary neg pulmonary ROS,    Pulmonary exam normal breath sounds clear to auscultation       Cardiovascular hypertension, Pt. on medications Normal cardiovascular exam Rhythm:Regular Rate:Normal     Neuro/Psych negative neurological ROS     GI/Hepatic negative GI ROS, Neg liver ROS,   Endo/Other  diabetes, Type 2, Oral Hypoglycemic Agents  Renal/GU negative Renal ROS     Musculoskeletal  (+) Arthritis , Osteoarthritis,    Abdominal   Peds  Hematology negative hematology ROS (+)   Anesthesia Other Findings Day of surgery medications reviewed with the patient.  Reproductive/Obstetrics                            Anesthesia Physical Anesthesia Plan  ASA: II  Anesthesia Plan: Spinal   Post-op Pain Management:    Induction:   PONV Risk Score and Plan: 1 and Treatment may vary due to age or medical condition, Dexamethasone and Ondansetron  Airway Management Planned: Nasal Cannula and Natural Airway  Additional Equipment:   Intra-op Plan:   Post-operative Plan:   Informed Consent: I have reviewed the patients History and Physical, chart, labs and discussed the procedure including the risks, benefits and alternatives for the proposed anesthesia with the patient or authorized representative who has indicated his/her understanding and acceptance.   Dental advisory given  Plan Discussed with: CRNA  Anesthesia Plan Comments:        Anesthesia Quick Evaluation

## 2018-11-19 NOTE — Anesthesia Procedure Notes (Signed)
Procedure Name: LMA Insertion Date/Time: 11/19/2018 1:35 PM Performed by: Lavina Hamman, CRNA Pre-anesthesia Checklist: Patient identified, Emergency Drugs available, Suction available, Patient being monitored and Timeout performed Patient Re-evaluated:Patient Re-evaluated prior to induction Oxygen Delivery Method: Circle system utilized Preoxygenation: Pre-oxygenation with 100% oxygen Induction Type: IV induction Ventilation: Mask ventilation without difficulty LMA: LMA inserted LMA Size: 4.0 Number of attempts: 1 Placement Confirmation: positive ETCO2 and breath sounds checked- equal and bilateral Tube secured with: Tape Dental Injury: Teeth and Oropharynx as per pre-operative assessment

## 2018-11-19 NOTE — Anesthesia Procedure Notes (Signed)
Spinal  Patient location during procedure: OR Start time: 11/19/2018 1:02 PM End time: 11/19/2018 1:04 PM Staffing Anesthesiologist: Brennan Bailey, MD Performed: anesthesiologist  Preanesthetic Checklist Completed: patient identified, surgical consent, pre-op evaluation, timeout performed, IV checked, risks and benefits discussed and monitors and equipment checked Spinal Block Patient position: sitting Prep: site prepped and draped and DuraPrep Patient monitoring: cardiac monitor, continuous pulse ox and blood pressure Approach: midline Location: L3-4 Injection technique: single-shot Needle Needle type: Pencan  Needle gauge: 24 G Needle length: 9 cm Additional Notes Risks, benefits, and alternative discussed. Patient gave consent to procedure. Prepped and draped in sitting position. First attempt at L4-5 with persistent os encountered. Second attempt at L3-4 with clear CSF obtained on first pass. Positive terminal aspiration. No pain or paraesthesias with injection. Patient tolerated procedure well. Vital signs stable. Tawny Asal, MD

## 2018-11-20 LAB — CBC
HEMATOCRIT: 45.4 % (ref 39.0–52.0)
HEMOGLOBIN: 14.7 g/dL (ref 13.0–17.0)
MCH: 29.8 pg (ref 26.0–34.0)
MCHC: 32.4 g/dL (ref 30.0–36.0)
MCV: 92.1 fL (ref 80.0–100.0)
NRBC: 0 % (ref 0.0–0.2)
Platelets: 145 10*3/uL — ABNORMAL LOW (ref 150–400)
RBC: 4.93 MIL/uL (ref 4.22–5.81)
RDW: 13.2 % (ref 11.5–15.5)
WBC: 12.2 10*3/uL — AB (ref 4.0–10.5)

## 2018-11-20 LAB — BASIC METABOLIC PANEL
ANION GAP: 8 (ref 5–15)
BUN: 21 mg/dL (ref 8–23)
CALCIUM: 8.7 mg/dL — AB (ref 8.9–10.3)
CHLORIDE: 106 mmol/L (ref 98–111)
CO2: 25 mmol/L (ref 22–32)
CREATININE: 1.16 mg/dL (ref 0.61–1.24)
GFR calc Af Amer: >60 mL/min (ref >60)
GFR calc non Af Amer: >60 mL/min (ref >60)
Glucose, Bld: 189 mg/dL — ABNORMAL HIGH (ref 70–99)
Potassium: 4.4 mmol/L (ref 3.5–5.1)
SODIUM: 139 mmol/L (ref 135–145)

## 2018-11-20 MED ORDER — ASPIRIN 81 MG PO CHEW: 81.0000 mg | CHEWABLE_TABLET | Freq: Two times a day (BID) | ORAL | 0 refills | Status: AC

## 2018-11-20 MED ORDER — METHOCARBAMOL 500 MG PO TABS: 500.0000 mg | ORAL_TABLET | Freq: Four times a day (QID) | ORAL | 0 refills | Status: DC | PRN

## 2018-11-20 MED ORDER — OXYCODONE HCL 5 MG PO TABS: 5.0000 mg | ORAL_TABLET | ORAL | 0 refills | Status: DC | PRN

## 2018-11-20 NOTE — Progress Notes (Signed)
Subjective: 1 Day Post-Op Procedure(s) (LRB): LEFT TOTAL HIP ARTHROPLASTY ANTERIOR APPROACH (Left) Patient reports pain as moderate.    Objective: Vital signs in last 24 hours: Temp:  [97.5 F (36.4 C)-99.7 F (37.6 C)] 98.2 F (36.8 C) (11/23 0942) Pulse Rate:  [64-111] 85 (11/23 0942) Resp:  [13-21] 16 (11/23 0942) BP: (123-159)/(79-97) 134/81 (11/23 0942) SpO2:  [95 %-100 %] 97 % (11/23 0942)  Intake/Output from previous day: 11/22 0701 - 11/23 0700 In: 2942.9 [P.O.:320; I.V.:2572.9; IV Piggyback:50] Out: 2200 [Urine:1700; Blood:500] Intake/Output this shift: Total I/O In: 780 [P.O.:480; I.V.:300] Out: 1100 [Urine:1100]  Recent Labs    11/20/18 0350  HGB 14.7   Recent Labs    11/20/18 0350  WBC 12.2*  RBC 4.93  HCT 45.4  PLT 145*   Recent Labs    11/20/18 0350  NA 139  K 4.4  CL 106  CO2 25  BUN 21  CREATININE 1.16  GLUCOSE 189*  CALCIUM 8.7*   No results for input(s): LABPT, INR in the last 72 hours.  Sensation intact distally Intact pulses distally Dorsiflexion/Plantar flexion intact Incision: dressing C/D/I  Assessment/Plan: 1 Day Post-Op Procedure(s) (LRB): LEFT TOTAL HIP ARTHROPLASTY ANTERIOR APPROACH (Left) Up with therapy Plan for discharge tomorrow Discharge home with home health    Mcarthur Rossetti 11/20/2018, 10:53 AM

## 2018-11-20 NOTE — Progress Notes (Signed)
Physical Therapy Treatment Patient Details Name: Nathan Frazier MRN: [id number] DOB: September 16, 1951 Today's Date: 11/20/2018    History of Present Illness L DA-THA, h/o DM2    PT Comments    Pt is progressing well with mobility, he ambulated 250' with RW. Will plan to do stair training tomorrow morning.   Follow Up Recommendations  Follow surgeon's recommendation for DC plan and follow-up therapies     Equipment Recommendations  Rolling walker with 5" wheels;3in1 (PT)    Recommendations for Other Services       Precautions / Restrictions Precautions Precautions: Fall Restrictions Weight Bearing Restrictions: No    Mobility  Bed Mobility Overal bed mobility: Modified Independent             General bed mobility comments: assisted LLE with RLE   Transfers Overall transfer level: Needs assistance Equipment used: Rolling walker (2 wheeled) Transfers: Sit to/from Stand Sit to Stand: Supervision         General transfer comment: VCs hand placement  Ambulation/Gait Ambulation/Gait assistance: Supervision Gait Distance (Feet): 250 Feet Assistive device: Rolling walker (2 wheeled) Gait Pattern/deviations: Step-to pattern;Antalgic Gait velocity: WFL   General Gait Details: steady with RW, VCs sequencing   Stairs             Wheelchair Mobility    Modified Rankin (Stroke Patients Only)       Balance Overall balance assessment: Modified Independent                                          Cognition Arousal/Alertness: Awake/alert Behavior During Therapy: WFL for tasks assessed/performed Overall Cognitive Status: Within Functional Limits for tasks assessed                                        Exercises     General Comments        Pertinent Vitals/Pain Pain Assessment: 0-10 Pain Score: 3  Pain Location: L hip Pain Descriptors / Indicators: Sore Pain Intervention(s): Limited activity within patient's  tolerance;Monitored during session;Premedicated before session(declined ice)    Home Living Family/patient expects to be discharged to:: Private residence Living Arrangements: Spouse/significant other Available Help at Discharge: Family;Available 24 hours/day Type of Home: House Home Access: Stairs to enter Entrance Stairs-Rails: None Home Layout: One level Home Equipment: None      Prior Function Level of Independence: Independent          PT Goals (current goals can now be found in the care plan section) Acute Rehab PT Goals 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, 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 Formulation: With patient/family Time For Goal Achievement: 11/23/18 Potential to Achieve Goals: Good Progress towards PT goals: Progressing toward goals    Frequency    7X/week      PT Plan Current plan remains appropriate    Co-evaluation              AM-PAC PT "6 Clicks" Mobility   Outcome Measure  Help needed turning from your back to your side while in a flat bed without using bedrails?: None Help needed moving from lying on your back to sitting on the side of a flat bed without using bedrails?: None Help needed moving to and from a bed to a chair (including a wheelchair)?: A  Little Help needed standing up from a chair using your arms (e.g., wheelchair or bedside chair)?: A Little Help needed to walk in hospital room?: A Little Help needed climbing 3-5 steps with a railing? : A Little 6 Click Score: 20    End of Session Equipment Utilized During Treatment: Gait belt Activity Tolerance: Patient tolerated treatment well Patient left: with call bell/phone within reach;in bed Nurse Communication: Mobility status PT Visit Diagnosis: Pain;Difficulty in walking, not elsewhere classified (R26.2) Pain - Right/Left: Left Pain - part of body: Hip     Time: 4758-3074 PT Time Calculation (min) (ACUTE ONLY): 20 min  Charges:  $Gait Training: 8-22 mins                      Blondell Reveal Kistler PT 11/20/2018  Acute Rehabilitation Services Pager [phone] Office [phone]

## 2018-11-20 NOTE — Discharge Instructions (Signed)

## 2018-11-20 NOTE — Evaluation (Signed)
Occupational Therapy Evaluation Patient Details Name: Nathan Frazier MRN: [id number] DOB: 1951/03/13 Today's Date: 11/20/2018    History of Present Illness L DA-THA, h/o DM2   Clinical Impression   Pt is s/p THA resulting in the deficits listed below (see OT Problem List).  Pt will benefit from skilled OT to increase their safety and independence with ADL and functional mobility for ADL to facilitate discharge to venue listed below.        Follow Up Recommendations  No OT follow up    Equipment Recommendations  3 in 1 bedside commode    Recommendations for Other Services       Precautions / Restrictions Precautions Precautions: Fall Restrictions Weight Bearing Restrictions: No      Mobility Bed Mobility Overal bed mobility: Modified Independent             General bed mobility comments: assisted LLE with RLE   Transfers Overall transfer level: Needs assistance Equipment used: Rolling walker (2 wheeled) Transfers: Sit to/from Stand Sit to Stand: Supervision         General transfer comment: VCs hand placement    Balance Overall balance assessment: No apparent balance deficits (not formally assessed)                                         ADL either performed or assessed with clinical judgement   ADL Overall ADL's : Needs assistance/impaired Eating/Feeding: Set up;Sitting   Grooming: Set up;Sitting   Upper Body Bathing: Set up;Sitting   Lower Body Bathing: Minimal assistance;Sit to/from stand;Cueing for sequencing;Cueing for safety   Upper Body Dressing : Set up;Sitting   Lower Body Dressing: Minimal assistance;Sit to/from stand;Cueing for sequencing;Cueing for safety   Toilet Transfer: Minimal assistance;RW;Ambulation   Toileting- Clothing Manipulation and Hygiene: Min guard;Sit to/from stand         General ADL Comments: wife will A as needed     Vision Patient Visual Report: No change from baseline        Perception     Praxis      Pertinent Vitals/Pain Pain Assessment: No/denies pain Pain Score: 2  Pain Location: L hip Pain Descriptors / Indicators: Sore Pain Intervention(s): Limited activity within patient's tolerance     Hand Dominance     Extremity/Trunk Assessment Upper Extremity Assessment Upper Extremity Assessment: Overall WFL for tasks assessed           Communication Communication Communication: No difficulties   Cognition Arousal/Alertness: Awake/alert Behavior During Therapy: WFL for tasks assessed/performed Overall Cognitive Status: Within Functional Limits for tasks assessed                                                Home Living Family/patient expects to be discharged to:: Private residence Living Arrangements: Spouse/significant other Available Help at Discharge: Family;Available 24 hours/day Type of Home: House Home Access: Stairs to enter [employer] of Steps: 3 Entrance Stairs-Rails: None Home Layout: One level     Bathroom Shower/Tub: Occupational psychologist: Standard     Home Equipment: None          Prior Functioning/Environment Level of Independence: Independent  OT Problem List: Decreased strength;Decreased knowledge of use of DME or AE      OT Treatment/Interventions: Self-care/ADL training;Patient/family education;DME and/or AE instruction    OT Goals(Current goals can be found in the care plan section) Acute Rehab OT Goals 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, return to real estate job  OT Frequency: Min 2X/week    AM-PAC OT "6 Clicks" Daily Activity     Outcome Measure Help from another person eating meals?: None Help from another person taking care of personal grooming?: None Help from another person toileting, which includes using toliet, bedpan, or urinal?: A Little Help from another person bathing (including washing, rinsing, drying)?: A Little Help from  another person to put on and taking off regular upper body clothing?: None Help from another person to put on and taking off regular lower body clothing?: A Little 6 Click Score: 21   End of Session Equipment Utilized During Treatment: Rolling walker Nurse Communication: Mobility status  Activity Tolerance: Patient tolerated treatment well Patient left: in chair  OT Visit Diagnosis: Unsteadiness on feet (R26.81)                Time: 3151-7616 OT Time Calculation (min): 17 min Charges:  OT General Charges $OT Visit: 1 Visit OT Evaluation $OT Eval Low Complexity: 1 Low  Kari Baars, OT Acute Rehabilitation Services [phone] Office- [phone]     Margi Edmundson, Edwena Felty D 11/20/2018, 4:40 PM

## 2018-11-20 NOTE — Care Management Note (Signed)
Case Management Note  Patient Details  Name: Nathan Frazier MRN: [id number] Date of Birth: 1951-03-04  Subjective/Objective: 67 yo M s/p L DA-THA                  Action/Plan: Received referral to assist with [facility] and DME   Expected Discharge Date:   11/21/18               Expected Discharge Plan:  [place]  In-House Referral:     Discharge planning Services  CM Consult  Post Acute Care Choice:  Home Health Choice offered to:  Patient  DME Arranged:  Bedside commode, Walker rolling DME Agency:  [facility]  HH Arranged:  PT [place] Agency:  [facility] [employer]  Status of Service:  Completed, signed off  If discussed at [employer] [facility] [employer], dates discussed:    Additional Comments: Met with pt. He plans to return home with the support of his wife. He needs a RW and a 3-in-1 BSC. Discussed preference for a [facility] agency. Referral made to [facility] by the physician's office. He agrees with [facility]. Contacted Reggie at [place] for DME referral. He reports that the best # to call is [phone] (wife's #) and Alwyn Ren at [place] [facility] made aware.  Norina Buzzard, RN 11/20/2018, 4:26 PM

## 2018-11-20 NOTE — Plan of Care (Signed)
Pt stable with no needs this am. No s/s of pain or distress. Family at bedside. Pt up to chair after working with physical therapy.

## 2018-11-20 NOTE — Evaluation (Signed)
Physical Therapy Evaluation Patient Details Name: Nathan Frazier MRN: [id number] DOB: July 21, 1951 Today's Date: 11/20/2018   History of Present Illness  L DA-THA, h/o DM2  Clinical Impression  Pt admitted with above diagnosis. Pt currently with functional limitations due to the deficits listed below (see PT Problem List). Pt ambulated 110' with RW and performed THA HEP with min assist. Good progress expected.  Pt will benefit from skilled PT to increase their independence and safety with mobility to allow discharge to the venue listed below.       Follow Up Recommendations Follow surgeon's recommendation for DC plan and follow-up therapies    Equipment Recommendations  Rolling walker with 5" wheels;3in1 (PT)    Recommendations for Other Services       Precautions / Restrictions Precautions Precautions: Fall Restrictions Weight Bearing Restrictions: No      Mobility  Bed Mobility Overal bed mobility: Modified Independent             General bed mobility comments: HOB up  Transfers Overall transfer level: Needs assistance Equipment used: Rolling walker (2 wheeled) Transfers: Sit to/from Stand Sit to Stand: Min guard;From elevated surface         General transfer comment: VCs hand placement  Ambulation/Gait Ambulation/Gait assistance: Min guard Gait Distance (Feet): 110 Feet Assistive device: Rolling walker (2 wheeled) Gait Pattern/deviations: Step-to pattern;Antalgic Gait velocity: decr   General Gait Details: steady with RW, VCs sequencing  Stairs            Wheelchair Mobility    Modified Rankin (Stroke Patients Only)       Balance Overall balance assessment: Modified Independent                                           Pertinent Vitals/Pain Pain Assessment: 0-10 Pain Score: 3  Pain Location: L hip Pain Descriptors / Indicators: Other (Comment)(stinging) Pain Intervention(s): Limited activity within patient's  tolerance;Monitored during session;Premedicated before session;Ice applied    Home Living Family/patient expects to be discharged to:: Private residence Living Arrangements: Spouse/significant other Available Help at Discharge: Family;Available 24 hours/day Type of Home: House Home Access: Stairs to enter Entrance Stairs-Rails: None Entrance Stairs-Number of Steps: 3 Home Layout: One level Home Equipment: None      Prior Function Level of Independence: Independent               Hand Dominance        Extremity/Trunk Assessment   Upper Extremity Assessment Upper Extremity Assessment: Overall WFL for tasks assessed    Lower Extremity Assessment Lower Extremity Assessment: LLE deficits/detail LLE Deficits / Details: knee ext -3/5, hip flexion AAROM ~45*, hip ABDuction ~15* AAROM LLE Sensation: WNL    Cervical / Trunk Assessment Cervical / Trunk Assessment: Normal  Communication   Communication: No difficulties  Cognition Arousal/Alertness: Awake/alert Behavior During Therapy: WFL for tasks assessed/performed Overall Cognitive Status: Within Functional Limits for tasks assessed                                        General Comments      Exercises Total Joint Exercises Ankle Circles/Pumps: AROM;Both;10 reps;Supine Quad Sets: AROM;Left;5 reps;Supine Short Arc Quad: AROM;AAROM;Left;10 reps;Supine Heel Slides: AAROM;Left;10 reps;Supine Hip ABduction/ADduction: AAROM;Left;10 reps;Supine   Assessment/Plan    PT Assessment  Patient needs continued PT services  PT Problem List Decreased strength;Decreased range of motion;Decreased activity tolerance;Decreased mobility;Pain;Decreased knowledge of use of DME       PT Treatment Interventions DME instruction;Gait training;Stair training;Therapeutic exercise;Therapeutic activities;Functional mobility training;Patient/family education    PT Goals (Current goals can be found in the Care Plan section)   Acute Rehab PT Goals 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, 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 Formulation: With patient/family Time For Goal Achievement: 11/23/18 Potential to Achieve Goals: Good    Frequency 7X/week   Barriers to discharge        Co-evaluation               AM-PAC PT "6 Clicks" Mobility  Outcome Measure Help needed turning from your back to your side while in a flat bed without using bedrails?: A Little Help needed moving from lying on your back to sitting on the side of a flat bed without using bedrails?: A Little Help needed moving to and from a bed to a chair (including a wheelchair)?: A Little Help needed standing up from a chair using your arms (e.g., wheelchair or bedside chair)?: A Little Help needed to walk in hospital room?: A Little Help needed climbing 3-5 steps with a railing? : A Little 6 Click Score: 18    End of Session Equipment Utilized During Treatment: Gait belt Activity Tolerance: Patient tolerated treatment well Patient left: in chair;with call bell/phone within reach;with family/visitor present Nurse Communication: Mobility status PT Visit Diagnosis: Pain;Difficulty in walking, not elsewhere classified (R26.2) Pain - Right/Left: Left Pain - part of body: Hip    Time: 3557-3220 PT Time Calculation (min) (ACUTE ONLY): 25 min   Charges:   PT Evaluation $PT Eval Low Complexity: 1 Low PT Treatments $Gait Training: 8-22 mins        Blondell Reveal Kistler PT 11/20/2018  Acute Rehabilitation Services Pager [phone] Office [phone]

## 2018-11-21 NOTE — Plan of Care (Signed)
Pt stable with no needs. No changes to note. Pt hoping to go home today. Pt with good mobility in room. Up in chair this am. Rn medicating for pain. Will continue to monitor.

## 2018-11-21 NOTE — Progress Notes (Signed)
Physical Therapy Treatment Patient Details Name: Nathan Frazier MRN: [id number] DOB: 1951-02-16 Today's Date: 11/21/2018    History of Present Illness L DA-THA, h/o DM2    PT Comments    Excellent progress with mobility. Pt ambulating 300 feet with RW supervision. Stair training complete. Pt able to actively perform all LE exercises. Plan is for d/c home today.   Follow Up Recommendations  Follow surgeon's recommendation for DC plan and follow-up therapies     Equipment Recommendations  Rolling walker with 5" wheels;3in1 (PT)    Recommendations for Other Services       Precautions / Restrictions Precautions Precautions: Fall Restrictions Weight Bearing Restrictions: No    Mobility  Bed Mobility Overal bed mobility: Modified Independent                Transfers   Equipment used: Rolling walker (2 wheeled) Transfers: Sit to/from Stand Sit to Stand: Supervision         General transfer comment: Pt demo safe technique  Ambulation/Gait Ambulation/Gait assistance: Supervision Gait Distance (Feet): 300 Feet Assistive device: Rolling walker (2 wheeled) Gait Pattern/deviations: Step-through pattern;Decreased stride length;Antalgic Gait velocity: WFL   General Gait Details: cues for step length   Stairs Stairs: Yes Stairs assistance: Min assist Stair Management: No rails;Backwards;With walker Number of Stairs: 3(x 2) General stair comments: cues for sequencing, assist to stabilize RW   Wheelchair Mobility    Modified Rankin (Stroke Patients Only)       Balance Overall balance assessment: No apparent balance deficits (not formally assessed)                                          Cognition Arousal/Alertness: Awake/alert Behavior During Therapy: WFL for tasks assessed/performed Overall Cognitive Status: Within Functional Limits for tasks assessed                                        Exercises Total Joint  Exercises Ankle Circles/Pumps: AROM;Both;20 reps Quad Sets: AROM;Both;20 reps Short Arc Quad: AROM;Left;10 reps Heel Slides: AROM;Left;10 reps Hip ABduction/ADduction: AROM;Left;10 reps    General Comments        Pertinent Vitals/Pain Pain Assessment: 0-10 Pain Score: 3  Pain Location: L hip Pain Descriptors / Indicators: Sore Pain Intervention(s): Monitored during session    Home Living                      Prior Function            PT Goals (current goals can now be found in the care plan section) Acute Rehab PT Goals 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, 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 Formulation: With patient/family Time For Goal Achievement: 11/23/18 Potential to Achieve Goals: Good Progress towards PT goals: Progressing toward goals    Frequency    7X/week      PT Plan Current plan remains appropriate    Co-evaluation              AM-PAC PT "6 Clicks" Mobility   Outcome Measure  Help needed turning from your back to your side while in a flat bed without using bedrails?: None Help needed moving from lying on your back to sitting on the side of a flat bed without using bedrails?: None Help  needed moving to and from a bed to a chair (including a wheelchair)?: None Help needed standing up from a chair using your arms (e.g., wheelchair or bedside chair)?: None Help needed to walk in hospital room?: None Help needed climbing 3-5 steps with a railing? : A Little 6 Click Score: 23    End of Session Equipment Utilized During Treatment: Gait belt Activity Tolerance: Patient tolerated treatment well Patient left: in chair;with call bell/phone within reach Nurse Communication: Mobility status PT Visit Diagnosis: Pain;Difficulty in walking, not elsewhere classified (R26.2) Pain - Right/Left: Left Pain - part of body: Hip     Time: 2951-8841 PT Time Calculation (min) (ACUTE ONLY): 31 min  Charges:  $Gait Training: 8-22 mins $Therapeutic  Exercise: 8-22 mins                     Lorrin Goodell, PT  Office # [phone] Pager [phone]    Lorriane Shire 11/21/2018, 10:53 AM

## 2018-11-21 NOTE — Progress Notes (Signed)
Pt d/c  Home stable condition with family. No needs at time of d/c. Pt has all equipment needed.

## 2018-11-21 NOTE — Progress Notes (Signed)
   Subjective: 2 Days Post-Op Procedure(s) (LRB): LEFT TOTAL HIP ARTHROPLASTY ANTERIOR APPROACH (Left) Patient reports pain as mild.    Objective: Vital signs in last 24 hours: Temp:  [97.9 F (36.6 C)-98.8 F (37.1 C)] 98.8 F (37.1 C) (11/24 0529) Pulse Rate:  [75-89] 89 (11/24 0529) Resp:  [16-18] 16 (11/24 0529) BP: (138-144)/(79-94) 144/94 (11/24 0529) SpO2:  [94 %-97 %] 94 % (11/24 0529)  Intake/Output from previous day: 11/23 0701 - 11/24 0700 In: 2060 [P.O.:1760; I.V.:300] Out: 2250 [Urine:2250] Intake/Output this shift: Total I/O In: 600 [P.O.:600] Out: -   Recent Labs    11/20/18 0350  HGB 14.7   Recent Labs    11/20/18 0350  WBC 12.2*  RBC 4.93  HCT 45.4  PLT 145*   Recent Labs    11/20/18 0350  NA 139  K 4.4  CL 106  CO2 25  BUN 21  CREATININE 1.16  GLUCOSE 189*  CALCIUM 8.7*   No results for input(s): LABPT, INR in the last 72 hours.  Neurologically intact No results found.  Assessment/Plan: 2 Days Post-Op Procedure(s) (LRB): LEFT TOTAL HIP ARTHROPLASTY ANTERIOR APPROACH (Left) Up with therapy, discharge home.   Marybelle Killings 11/21/2018, 10:28 AM

## 2018-11-22 ENCOUNTER — Telehealth: Payer: Self-pay | Admitting: *Deleted

## 2018-11-22 ENCOUNTER — Other Ambulatory Visit: Payer: Self-pay | Admitting: Orthopaedic Surgery

## 2018-11-22 ENCOUNTER — Encounter (HOSPITAL_COMMUNITY): Payer: Self-pay | Admitting: Orthopaedic Surgery

## 2018-11-22 DIAGNOSIS — M19049 Primary osteoarthritis, unspecified hand: Secondary | ICD-10-CM | POA: Diagnosis not present

## 2018-11-22 DIAGNOSIS — E1169 Type 2 diabetes mellitus with other specified complication: Secondary | ICD-10-CM | POA: Diagnosis not present

## 2018-11-22 DIAGNOSIS — Z471 Aftercare following joint replacement surgery: Secondary | ICD-10-CM | POA: Diagnosis not present

## 2018-11-22 DIAGNOSIS — Z7984 Long term (current) use of oral hypoglycemic drugs: Secondary | ICD-10-CM | POA: Diagnosis not present

## 2018-11-22 DIAGNOSIS — M47819 Spondylosis without myelopathy or radiculopathy, site unspecified: Secondary | ICD-10-CM | POA: Diagnosis not present

## 2018-11-22 DIAGNOSIS — Z96642 Presence of left artificial hip joint: Secondary | ICD-10-CM | POA: Diagnosis not present

## 2018-11-22 DIAGNOSIS — I10 Essential (primary) hypertension: Secondary | ICD-10-CM | POA: Diagnosis not present

## 2018-11-22 DIAGNOSIS — E785 Hyperlipidemia, unspecified: Secondary | ICD-10-CM | POA: Diagnosis not present

## 2018-11-22 NOTE — Discharge Summary (Signed)
Patient ID: ABDALRAHMAN CLEMENTSON MRN: [id number] DOB/AGE: 09/11/1951 67 y.o.  Admit date: 11/19/2018 Discharge date: 11/22/2018  Admission Diagnoses:  Principal Problem:   Unilateral primary osteoarthritis, left hip Active Problems:   Status post total replacement of left hip   Discharge Diagnoses:  Same  Past Medical History:  Diagnosis Date  . Allergic rhinitis   . Arthritis   . Familial tremor   . Fracture    right thumb, right elbow  . Other and unspecified hyperlipidemia   . Special screening for malignant neoplasms, unspecified intestine   . Type II or unspecified type diabetes mellitus without mention of complication, not stated as uncontrolled   . Unspecified essential hypertension     Surgeries: Procedure(s): LEFT TOTAL HIP ARTHROPLASTY ANTERIOR APPROACH on 11/19/2018   Consultants:   Discharged Condition: Improved  Hospital Course: JOSEGUADALUPE STAN is an 67 y.o. male who was admitted 11/19/2018 for operative treatment ofUnilateral primary osteoarthritis, left hip. Patient has severe unremitting pain that affects sleep, daily activities, and work/hobbies. After pre-op clearance the patient was taken to the operating room on 11/19/2018 and underwent  Procedure(s): LEFT TOTAL HIP ARTHROPLASTY ANTERIOR APPROACH.    Patient was given perioperative antibiotics:  Anti-infectives (From admission, onward)   Start     Dose/Rate Route Frequency Ordered Stop   11/19/18 2000  ceFAZolin (ANCEF) IVPB 1 g/50 mL premix     1 g 100 mL/hr over 30 Minutes Intravenous Every 6 hours 11/19/18 1741 11/20/18 0310   11/19/18 0945  ceFAZolin (ANCEF) IVPB 2g/100 mL premix     2 g 200 mL/hr over 30 Minutes Intravenous On call to O.R. 11/19/18 0933 11/19/18 1259       Patient was given sequential compression devices, early ambulation, and chemoprophylaxis to prevent DVT.  Patient benefited maximally from hospital stay and there were no complications.    Recent vital signs: No data found.    Recent laboratory studies:  Recent Labs    11/20/18 0350  WBC 12.2*  HGB 14.7  HCT 45.4  PLT 145*  NA 139  K 4.4  CL 106  CO2 25  BUN 21  CREATININE 1.16  GLUCOSE 189*  CALCIUM 8.7*     Discharge Medications:   Allergies as of 11/21/2018      Reactions   Metformin And Related    heartburn   Pravastatin    confusion      Medication List    STOP taking these medications   aspirin EC 81 MG tablet Replaced by:  aspirin 81 MG chewable tablet     TAKE these medications   acetaminophen 500 MG tablet Commonly known as:  TYLENOL Take 1,000 mg by mouth daily as needed for moderate pain or headache.   aspirin 81 MG chewable tablet Chew 1 tablet (81 mg total) by mouth 2 (two) times daily. Replaces:  aspirin EC 81 MG tablet   atorvastatin 40 MG tablet Commonly known as:  LIPITOR Take 40 mg by mouth daily.   b complex vitamins tablet Take 1 tablet by mouth daily.   BENZEDREX NA Place 1 Dose into both nostrils daily as needed (congestion).   cetaphil cream Apply 1 application topically as needed (dry skin).   INVOKAMET XR [phone] MG Tb24 Generic drug:  Canagliflozin-metFORMIN HCl ER Take 2 tablets by mouth daily.   lisinopril 20 MG tablet Commonly known as:  PRINIVIL,ZESTRIL Take 1 tablet (20 mg total) by mouth daily.   methocarbamol 500 MG tablet Commonly known as:  ROBAXIN Take 1 tablet (500 mg total) by mouth every 6 (six) hours as needed for muscle spasms.   multivitamin with minerals Tabs tablet Take 1 tablet by mouth daily.   OVER THE COUNTER MEDICATION Take 1 capsule by mouth daily. ALJ dietary supplement   oxyCODONE 5 MG immediate release tablet Commonly known as:  Oxy IR/ROXICODONE Take 1-2 tablets (5-10 mg total) by mouth every 4 (four) hours as needed for moderate pain (pain score 4-6).   pioglitazone 15 MG tablet Commonly known as:  ACTOS Take 15 mg by mouth daily.   SYSTANE OP Place 1 drop into both eyes daily as needed (burning /  irritation).   VICTOZA 18 MG/3ML Sopn Generic drug:  liraglutide Inject 1.2 mg into the skin daily.       Diagnostic Studies: Dg Pelvis Portable  Result Date: 11/19/2018 CLINICAL DATA:  Status post left hip arthroplasty. EXAM: PORTABLE PELVIS 1-2 VIEWS COMPARISON:  Earlier today FINDINGS: Hardware components of a left total hip arthroplasty device are identified. No periprosthetic fracture or subluxation identified. IMPRESSION: Status post left total hip arthroplasty. No immediate complications noted. Electronically Signed   By: Kerby Moors M.D.   On: 11/19/2018 15:26   Dg C-arm 1-60 Min-no Report  Result Date: 11/19/2018 Fluoroscopy was utilized by the requesting physician.  No radiographic interpretation.   Dg Hip Operative Unilat W Or W/o Pelvis Left  Result Date: 11/19/2018 CLINICAL DATA:  Total left hip replacement EXAM: OPERATIVE LEFT HIP (WITH PELVIS IF PERFORMED) 6 VIEWS TECHNIQUE: Fluoroscopic spot image(s) were submitted for interpretation post-operatively. COMPARISON:  11/02/2018 FINDINGS: Multiple intraoperative spot images demonstrate changes of left hip replacement. Normal AP alignment. No hardware or bony complicating feature. IMPRESSION: Left hip replacement.  No complicating feature. Electronically Signed   By: Rolm Baptise M.D.   On: 11/19/2018 14:42   Xr Hip Unilat W Or W/o Pelvis 1v Left  Result Date: 11/02/2018 An AP pelvis and lateral left hip show severe end-stage arthritis of the left hip.  There is complete loss of superior lateral joint space.  There is cystic change in the acetabulum.  There are particular osteophytes around the femoral head.   Disposition:     Follow-up Information    Mcarthur Rossetti, MD. Go on 12/02/2018.   Specialty:  Orthopedic Surgery Why:  Your appointment has been set for 1:30 Contact information: [place] [address] [phone]        Home, [facility] Follow up.   Specialty:  [place] Why:  you will be seen by HHPT for 5 visits prior to you physician follow up  Contact information: [place] [address] [phone]            Signed: Erskine Emery 11/22/2018, 5:48 PM

## 2018-11-22 NOTE — Telephone Encounter (Signed)
1 Pt was on TCM report admitted 11/19/18 for left total hip arthroplasty. Pt D/C 11/21/18, and will follow-up specialist  Nathan Frazier on 12/02/18...[name]

## 2018-11-24 DIAGNOSIS — I10 Essential (primary) hypertension: Secondary | ICD-10-CM | POA: Diagnosis not present

## 2018-11-24 DIAGNOSIS — E785 Hyperlipidemia, unspecified: Secondary | ICD-10-CM | POA: Diagnosis not present

## 2018-11-24 DIAGNOSIS — M19049 Primary osteoarthritis, unspecified hand: Secondary | ICD-10-CM | POA: Diagnosis not present

## 2018-11-24 DIAGNOSIS — E1169 Type 2 diabetes mellitus with other specified complication: Secondary | ICD-10-CM | POA: Diagnosis not present

## 2018-11-24 DIAGNOSIS — Z471 Aftercare following joint replacement surgery: Secondary | ICD-10-CM | POA: Diagnosis not present

## 2018-11-24 DIAGNOSIS — M47819 Spondylosis without myelopathy or radiculopathy, site unspecified: Secondary | ICD-10-CM | POA: Diagnosis not present

## 2018-11-26 DIAGNOSIS — E785 Hyperlipidemia, unspecified: Secondary | ICD-10-CM | POA: Diagnosis not present

## 2018-11-26 DIAGNOSIS — M19049 Primary osteoarthritis, unspecified hand: Secondary | ICD-10-CM | POA: Diagnosis not present

## 2018-11-26 DIAGNOSIS — Z471 Aftercare following joint replacement surgery: Secondary | ICD-10-CM | POA: Diagnosis not present

## 2018-11-26 DIAGNOSIS — M47819 Spondylosis without myelopathy or radiculopathy, site unspecified: Secondary | ICD-10-CM | POA: Diagnosis not present

## 2018-11-26 DIAGNOSIS — E1169 Type 2 diabetes mellitus with other specified complication: Secondary | ICD-10-CM | POA: Diagnosis not present

## 2018-11-26 DIAGNOSIS — I10 Essential (primary) hypertension: Secondary | ICD-10-CM | POA: Diagnosis not present

## 2018-11-29 ENCOUNTER — Ambulatory Visit (INDEPENDENT_AMBULATORY_CARE_PROVIDER_SITE_OTHER): Payer: Medicare Other | Admitting: Orthopaedic Surgery

## 2018-11-29 DIAGNOSIS — E1169 Type 2 diabetes mellitus with other specified complication: Secondary | ICD-10-CM | POA: Diagnosis not present

## 2018-11-29 DIAGNOSIS — E785 Hyperlipidemia, unspecified: Secondary | ICD-10-CM | POA: Diagnosis not present

## 2018-11-29 DIAGNOSIS — Z471 Aftercare following joint replacement surgery: Secondary | ICD-10-CM | POA: Diagnosis not present

## 2018-11-29 DIAGNOSIS — M47819 Spondylosis without myelopathy or radiculopathy, site unspecified: Secondary | ICD-10-CM | POA: Diagnosis not present

## 2018-11-29 DIAGNOSIS — M19049 Primary osteoarthritis, unspecified hand: Secondary | ICD-10-CM | POA: Diagnosis not present

## 2018-11-29 DIAGNOSIS — I10 Essential (primary) hypertension: Secondary | ICD-10-CM | POA: Diagnosis not present

## 2018-12-02 ENCOUNTER — Encounter (INDEPENDENT_AMBULATORY_CARE_PROVIDER_SITE_OTHER): Payer: Self-pay | Admitting: Orthopaedic Surgery

## 2018-12-02 ENCOUNTER — Ambulatory Visit (INDEPENDENT_AMBULATORY_CARE_PROVIDER_SITE_OTHER): Payer: Medicare Other | Admitting: Orthopaedic Surgery

## 2018-12-02 DIAGNOSIS — M19049 Primary osteoarthritis, unspecified hand: Secondary | ICD-10-CM | POA: Diagnosis not present

## 2018-12-02 DIAGNOSIS — E785 Hyperlipidemia, unspecified: Secondary | ICD-10-CM | POA: Diagnosis not present

## 2018-12-02 DIAGNOSIS — M47819 Spondylosis without myelopathy or radiculopathy, site unspecified: Secondary | ICD-10-CM | POA: Diagnosis not present

## 2018-12-02 DIAGNOSIS — Z471 Aftercare following joint replacement surgery: Secondary | ICD-10-CM | POA: Diagnosis not present

## 2018-12-02 DIAGNOSIS — I10 Essential (primary) hypertension: Secondary | ICD-10-CM | POA: Diagnosis not present

## 2018-12-02 DIAGNOSIS — E1169 Type 2 diabetes mellitus with other specified complication: Secondary | ICD-10-CM | POA: Diagnosis not present

## 2018-12-02 DIAGNOSIS — Z96642 Presence of left artificial hip joint: Secondary | ICD-10-CM

## 2018-12-02 MED ORDER — METHOCARBAMOL 500 MG PO TABS: 500.0000 mg | ORAL_TABLET | Freq: Four times a day (QID) | ORAL | 0 refills | Status: DC | PRN

## 2018-12-02 NOTE — Progress Notes (Signed)
The patient is 2 weeks tomorrow status post a left total hip arthroplasty.  He is doing well overall.  He is ambulate with a cane.  He is sometimes not using at home.  He is only on methocarbamol on a twice daily aspirin.  He was on once a day before surgery.  He understands he can go back to this.  On exam his incision looks good.  Remove the staples and placed Steri-Strips.  He does have a Seroma but When I Try to Drain Fluid from the Area Only Got about 25 Cc so Showed Is More of a Hematoma.  I Recommended Heat Intermittently and This Will Help.  He Will Continue Increase His Activities As Comfort Allows.  I Will Send in Some More Robaxin for Him.  He Can Drive from My Standpoint.  We Will See Him Back in 4 Weeks to See How He Is Doing Overall but No X-Rays Are Needed.

## 2018-12-06 DIAGNOSIS — E1165 Type 2 diabetes mellitus with hyperglycemia: Secondary | ICD-10-CM | POA: Diagnosis not present

## 2018-12-06 DIAGNOSIS — E782 Mixed hyperlipidemia: Secondary | ICD-10-CM | POA: Diagnosis not present

## 2018-12-09 ENCOUNTER — Other Ambulatory Visit: Payer: Self-pay | Admitting: Orthopaedic Surgery

## 2018-12-16 DIAGNOSIS — Z794 Long term (current) use of insulin: Secondary | ICD-10-CM | POA: Diagnosis not present

## 2018-12-16 DIAGNOSIS — E782 Mixed hyperlipidemia: Secondary | ICD-10-CM | POA: Diagnosis not present

## 2018-12-16 DIAGNOSIS — E1142 Type 2 diabetes mellitus with diabetic polyneuropathy: Secondary | ICD-10-CM | POA: Diagnosis not present

## 2018-12-16 DIAGNOSIS — E1165 Type 2 diabetes mellitus with hyperglycemia: Secondary | ICD-10-CM | POA: Diagnosis not present

## 2018-12-16 DIAGNOSIS — R809 Proteinuria, unspecified: Secondary | ICD-10-CM | POA: Diagnosis not present

## 2018-12-16 DIAGNOSIS — I10 Essential (primary) hypertension: Secondary | ICD-10-CM | POA: Diagnosis not present

## 2018-12-24 ENCOUNTER — Other Ambulatory Visit: Payer: Self-pay | Admitting: Orthopaedic Surgery

## 2019-01-03 ENCOUNTER — Encounter (INDEPENDENT_AMBULATORY_CARE_PROVIDER_SITE_OTHER): Payer: Self-pay | Admitting: Orthopaedic Surgery

## 2019-01-03 ENCOUNTER — Ambulatory Visit (INDEPENDENT_AMBULATORY_CARE_PROVIDER_SITE_OTHER): Payer: Medicare Other | Admitting: Orthopaedic Surgery

## 2019-01-03 DIAGNOSIS — Z96642 Presence of left artificial hip joint: Secondary | ICD-10-CM

## 2019-01-03 NOTE — Progress Notes (Signed)
The patient is now 6 weeks status post a left total hip arthroplasty.  He is resolving cold weather causing him some problems he can complain on house: He feels like he is doing well overall and is doing better with putting shoes and socks on.  On exam he is walking without a limp.  He is not using assistive device to get around.  He tolerates me easily putting his right and left operative hip the range of motion.  His leg lengths appear equal.  This point we will continue crease his activities as comfort allows.  We will see him back in 6 months.  We can see him sooner if there is any issues.  At his 69-month follow-up I would like a standing low AP pelvis and a lateral of his left operative hip.

## 2019-01-04 ENCOUNTER — Other Ambulatory Visit: Payer: Self-pay | Admitting: Orthopaedic Surgery

## 2019-01-14 ENCOUNTER — Other Ambulatory Visit: Payer: Self-pay | Admitting: Orthopaedic Surgery

## 2019-01-24 ENCOUNTER — Encounter: Payer: Self-pay | Admitting: Nurse Practitioner

## 2019-01-24 ENCOUNTER — Ambulatory Visit (INDEPENDENT_AMBULATORY_CARE_PROVIDER_SITE_OTHER): Payer: Medicare Other | Admitting: Nurse Practitioner

## 2019-01-24 VITALS — BP 150/84 | HR 97 | Temp 97.8°F | Ht 68.0 in | Wt 178.0 lb

## 2019-01-24 DIAGNOSIS — J019 Acute sinusitis, unspecified: Secondary | ICD-10-CM | POA: Diagnosis not present

## 2019-01-24 MED ORDER — AMOXICILLIN-POT CLAVULANATE 875-125 MG PO TABS: 1.0000 | ORAL_TABLET | Freq: Two times a day (BID) | ORAL | 0 refills | Status: DC

## 2019-01-24 NOTE — Patient Instructions (Signed)
Please follow up for fevers over 101, if your symptoms get worse, or if your symptoms dont improve with the antibiotic.    Sinusitis, Adult Sinusitis is soreness and swelling (inflammation) of your sinuses. Sinuses are hollow spaces in the bones around your face. They are located:  Around your eyes.  In the middle of your forehead.  Behind your nose.  In your cheekbones. Your sinuses and nasal passages are lined with a fluid called mucus. Mucus drains out of your sinuses. Swelling can trap mucus in your sinuses. This lets germs (bacteria, virus, or fungus) grow, which leads to infection. Most of the time, this condition is caused by a virus. What are the causes? This condition is caused by:  Allergies.  Asthma.  Germs.  Things that block your nose or sinuses.  Growths in the nose (nasal polyps).  Chemicals or irritants in the air.  Fungus (rare). What increases the risk? You are more likely to develop this condition if:  You have a weak body defense system (immune system).  You do a lot of swimming or diving.  You use nasal sprays too much.  You smoke. What are the signs or symptoms? The main symptoms of this condition are pain and a feeling of pressure around the sinuses. Other symptoms include:  Stuffy nose (congestion).  Runny nose (drainage).  Swelling and warmth in the sinuses.  Headache.  Toothache.  A cough that may get worse at night.  Mucus that collects in the throat or the back of the nose (postnasal drip).  Being unable to smell and taste.  Being very tired (fatigue).  A fever.  Sore throat.  Bad breath. How is this diagnosed? This condition is diagnosed based on:  Your symptoms.  Your medical history.  A physical exam.  Tests to find out if your condition is short-term (acute) or long-term (chronic). Your doctor may: ? Check your nose for growths (polyps). ? Check your sinuses using a tool that has a light  (endoscope). ? Check for allergies or germs. ? Do imaging tests, such as an MRI or CT scan. How is this treated? Treatment for this condition depends on the cause and whether it is short-term or long-term.  If caused by a virus, your symptoms should go away on their own within 10 days. You may be given medicines to relieve symptoms. They include: ? Medicines that shrink swollen tissue in the nose. ? Medicines that treat allergies (antihistamines). ? A spray that treats swelling of the nostrils. ? Rinses that help get rid of thick mucus in your nose (nasal saline washes).  If caused by bacteria, your doctor may wait to see if you will get better without treatment. You may be given antibiotic medicine if you have: ? A very bad infection. ? A weak body defense system.  If caused by growths in the nose, you may need to have surgery. Follow these instructions at home: Medicines  Take, use, or apply over-the-counter and prescription medicines only as told by your doctor. These may include nasal sprays.  If you were prescribed an antibiotic medicine, take it as told by your doctor. Do not stop taking the antibiotic even if you start to feel better. Hydrate and humidify   Drink enough water to keep your pee (urine) pale yellow.  Use a cool mist humidifier to keep the humidity level in your home above 50%.  Breathe in steam for 10-15 minutes, 3-4 times a day, or as told by your doctor.  You can do this in the bathroom while a hot shower is running.  Try not to spend time in cool or dry air. Rest  Rest as much as you can.  Sleep with your head raised (elevated).  Make sure you get enough sleep each night. General instructions   Put a warm, moist washcloth on your face 3-4 times a day, or as often as told by your doctor. This will help with discomfort.  Wash your hands often with soap and water. If there is no soap and water, use hand sanitizer.  Do not smoke. Avoid being around  people who are smoking (secondhand smoke).  Keep all follow-up visits as told by your doctor. This is important. Contact a doctor if:  You have a fever.  Your symptoms get worse.  Your symptoms do not get better within 10 days. Get help right away if:  You have a very bad headache.  You cannot stop throwing up (vomiting).  You have very bad pain or swelling around your face or eyes.  You have trouble seeing.  You feel confused.  Your neck is stiff.  You have trouble breathing. Summary  Sinusitis is swelling of your sinuses. Sinuses are hollow spaces in the bones around your face.  This condition is caused by tissues in your nose that become inflamed or swollen. This traps germs. These can lead to infection.  If you were prescribed an antibiotic medicine, take it as told by your doctor. Do not stop taking it even if you start to feel better.  Keep all follow-up visits as told by your doctor. This is important. This information is not intended to replace advice given to you by your health care provider. Make sure you discuss any questions you have with your health care provider. Document Released: 06/02/2008 Document Revised: 05/17/2018 Document Reviewed: 05/17/2018 Elsevier Interactive Patient Education  2019 [employer].

## 2019-01-24 NOTE — Progress Notes (Signed)
Nathan Frazier is a 68 y.o. male with the following history as recorded in Epic[employer]:  Patient Active Problem List   Diagnosis Date Noted  . Status post total replacement of left hip 11/19/2018  . Unilateral primary osteoarthritis, left hip 01/26/2018  . Routine health maintenance 02/05/2013  . OA (osteoarthritis of the spine) 02/05/2013  . OA (osteoarthritis) of finger 02/05/2013  . ALLERGIC RHINITIS 03/08/2008  . Hyperlipidemia associated with type 2 diabetes mellitus ([place]) 03/06/2008  . Diabetes mellitus type 2, controlled, with complications ([place]) 16/09/9603  . Essential hypertension 09/27/2007    Current Outpatient Medications  Medication Sig Dispense Refill  . acetaminophen (TYLENOL) 500 MG tablet Take 1,000 mg by mouth daily as needed for moderate pain or headache.    Marland Kitchen aspirin 81 MG chewable tablet Chew 1 tablet (81 mg total) by mouth 2 (two) times daily. 30 tablet 0  . atorvastatin (LIPITOR) 40 MG tablet Take 40 mg by mouth daily.    Marland Kitchen b complex vitamins tablet Take 1 tablet by mouth daily.    . Canagliflozin-Metformin HCl ER (INVOKAMET XR) [phone] MG TB24 Take 2 tablets by mouth daily.     . Emollient (CETAPHIL) cream Apply 1 application topically as needed (dry skin).    . Liraglutide (VICTOZA) 18 MG/3ML SOPN Inject 1.2 mg into the skin daily.     Marland Kitchen lisinopril (PRINIVIL,ZESTRIL) 20 MG tablet Take 1 tablet (20 mg total) by mouth daily. 90 tablet 3  . methocarbamol (ROBAXIN) 500 MG tablet Take 1 tablet (500 mg total) by mouth every 6 (six) hours as needed for muscle spasms. 40 tablet 0  . Multiple Vitamin (MULTIVITAMIN WITH MINERALS) TABS tablet Take 1 tablet by mouth daily.    Marland Kitchen OVER THE COUNTER MEDICATION Take 1 capsule by mouth daily. ALJ dietary supplement    . oxyCODONE (OXY IR/ROXICODONE) 5 MG immediate release tablet Take 1-2 tablets (5-10 mg total) by mouth every 4 (four) hours as needed for moderate pain (pain score 4-6). 40 tablet 0  . pioglitazone (ACTOS) 15 MG tablet  Take 15 mg by mouth daily.    Vladimir Faster Glycol-Propyl Glycol (SYSTANE OP) Place 1 drop into both eyes daily as needed (burning / irritation).    . Propylhexedrine (BENZEDREX NA) Place 1 Dose into both nostrils daily as needed (congestion).    Marland Kitchen amoxicillin-clavulanate (AUGMENTIN) 875-125 MG tablet Take 1 tablet by mouth 2 (two) times daily. 14 tablet 0   No current facility-administered medications for this visit.     Allergies: Metformin and related and Pravastatin  Past Medical History:  Diagnosis Date  . Allergic rhinitis   . Arthritis   . Familial tremor   . Fracture    right thumb, right elbow  . Other and unspecified hyperlipidemia   . Special screening for malignant neoplasms, unspecified intestine   . Type II or unspecified type diabetes mellitus without mention of complication, not stated as uncontrolled   . Unspecified essential hypertension     Past Surgical History:  Procedure Laterality Date  . THYROID CYST EXCISION  1983  . TOTAL HIP ARTHROPLASTY Left 11/19/2018   Procedure: LEFT TOTAL HIP ARTHROPLASTY ANTERIOR APPROACH;  Surgeon: Mcarthur Rossetti, MD;  Location: [facility];  Service: Orthopedics;  Laterality: Left;  Failed Spinal    Family History  Problem Relation Age of Onset  . Heart disease Maternal Grandfather   . Colon cancer Neg Hx     Social History   Tobacco Use  . Smoking status: Never  Smoker  . Smokeless tobacco: Never Used  Substance Use Topics  . Alcohol use: Yes    Alcohol/week: 1.0 standard drinks    Types: 1 Standard drinks or equivalent per week     Subjective:  Nathan Frazier is here requesting evaluation of acute complaint of sinus/cold symptoms, which first began about 1.5 weeks ago after a dental procedure to remove a tooth.  Reports: headaches, body aches, sinus pain and pressure, nasal congestion and drainage, sore throat Denies: fevers, chills, cough, cp, sob, abdominal pain, nausea, vomiting, diarrhea Smoker? Never  Tried at  home: mucinex Improvement/worsening since onset? Worse He's had sinus infections in the past, says his symptoms feel the same  ROS- See HPI  Objective:  Vitals:   01/24/19 1024  BP: (!) 150/84  Pulse: 97  Temp: 97.8 F (36.6 C)  TempSrc: Oral  SpO2: 98%  Weight: 178 lb (80.7 kg)  Height: 5\' 8"  (1.727 m)    General: Well developed, well nourished, in no acute distress  Skin : Warm and dry.  Head: Normocephalic and atraumatic  Eyes: Sclera and conjunctiva clear; pupils round and reactive to light; extraocular movements intact  Ears: External normal; canals clear; tympanic membranes bulging bilaterally, without erythema or effusion Oropharynx: Pink, supple. No suspicious lesions  Neck: Supple without thyromegaly, adenopathy  Lungs: Respirations unlabored; clear to auscultation bilaterally without wheeze, rales, rhonchi  CVS exam: normal rate and regular rhythm, S1 and S2 normal.  Extremities: No edema, cyanosis, clubbing  Vessels: Symmetric bilaterally  Neurologic: Alert and oriented; speech intact; face symmetrical; moves all extremities well; CNII-XII intact without focal deficit  Psychiatric: Normal mood and affect.   Assessment:  1. Acute sinusitis, recurrence not specified, unspecified location     Plan:   Due to duration, worsening symptoms will treat with augmentin course- medication dosing, side effects discussed Home management, red flags and return precautions including when to seek immediate care discussed and printed on AVS He will f/u for new, worsening symptoms or if no improvement after antibiotic course  No follow-ups on file.  No orders of the defined types were placed in this encounter.   Requested Prescriptions   Signed Prescriptions Disp Refills  . amoxicillin-clavulanate (AUGMENTIN) 875-125 MG tablet 14 tablet 0    Sig: Take 1 tablet by mouth 2 (two) times daily.

## 2019-02-22 IMAGING — RF DG HIP (WITH PELVIS) OPERATIVE*L*
1 series · 6 of 6 positions shown · non-contrast
Comparison: 11/02/2018

CLINICAL DATA: Total left hip replacement

EXAM:
OPERATIVE LEFT HIP (WITH PELVIS IF PERFORMED) 6 VIEWS
TECHNIQUE: Fluoroscopic spot image(s) were submitted for interpretation
post-operatively.

[Series 1: unknown protocol · 0.20mm/px · 6 of 6 slices shown]
[im 1/6]
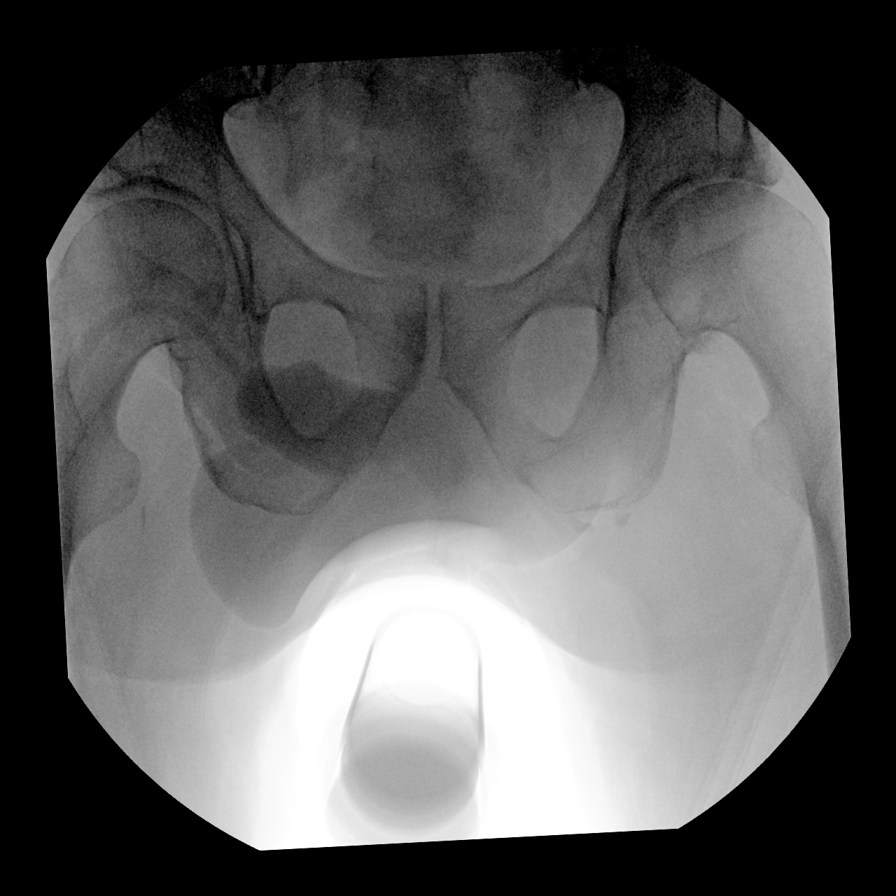
[im 2/6]
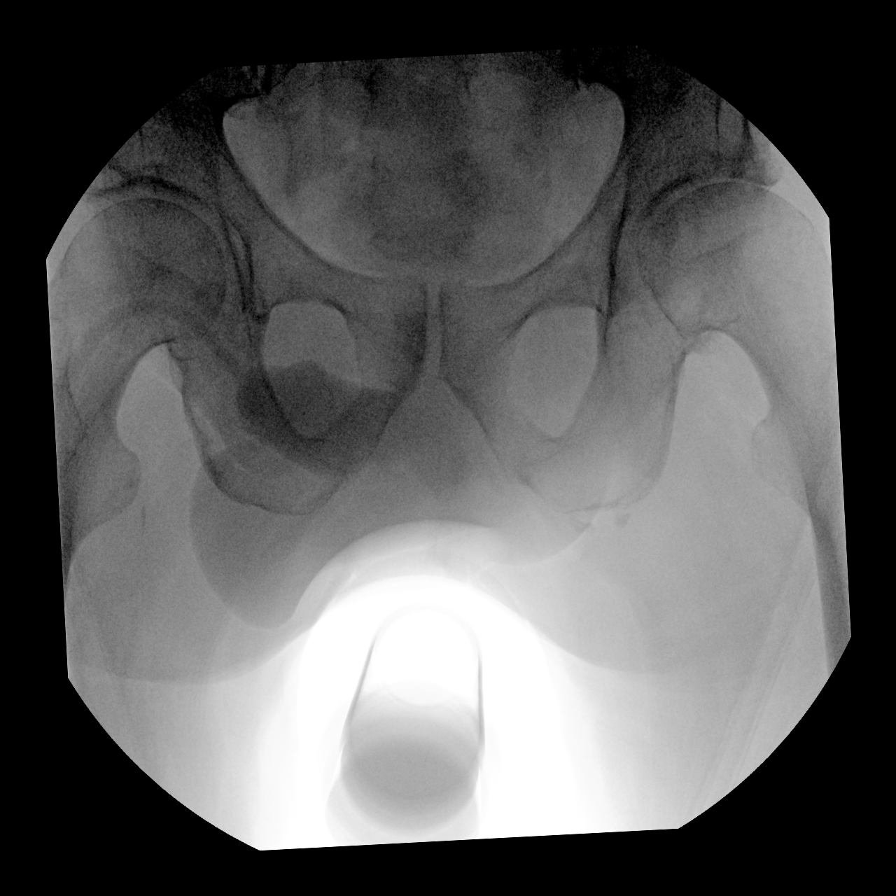
[im 3/6]
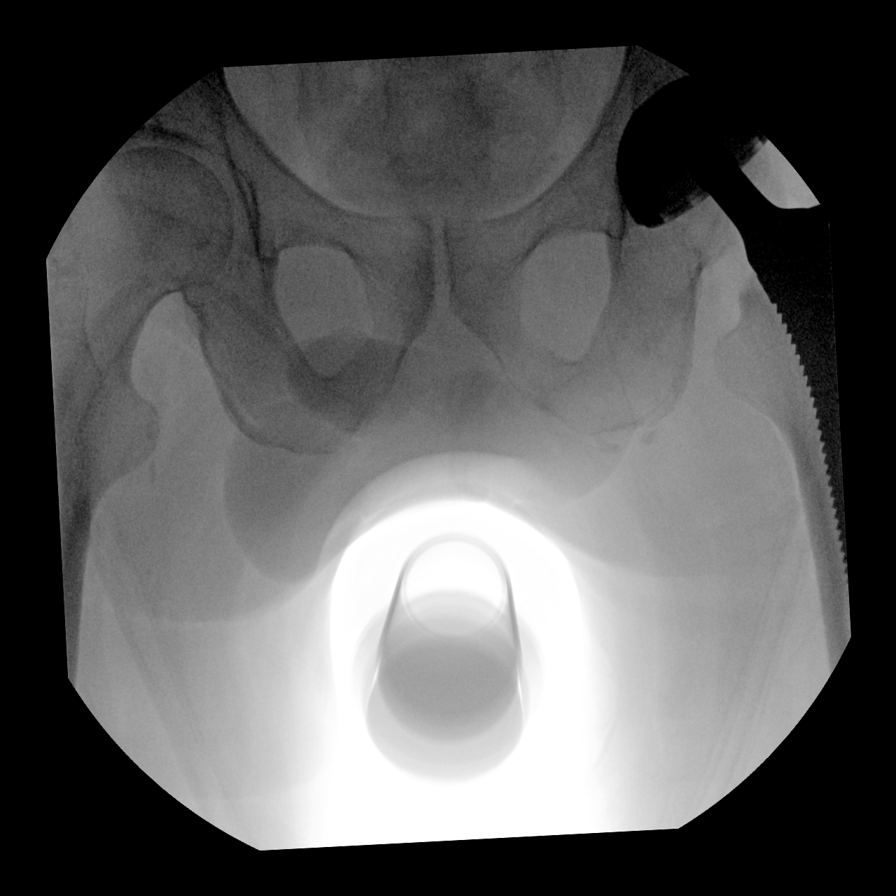
[im 4/6]
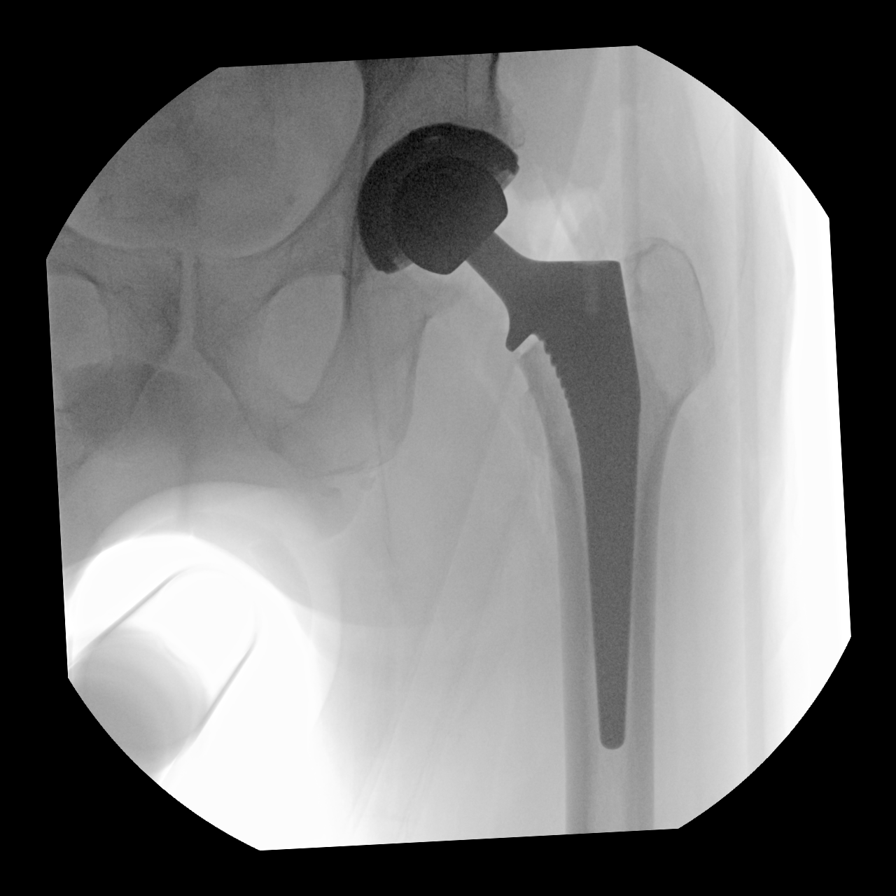
[im 5/6]
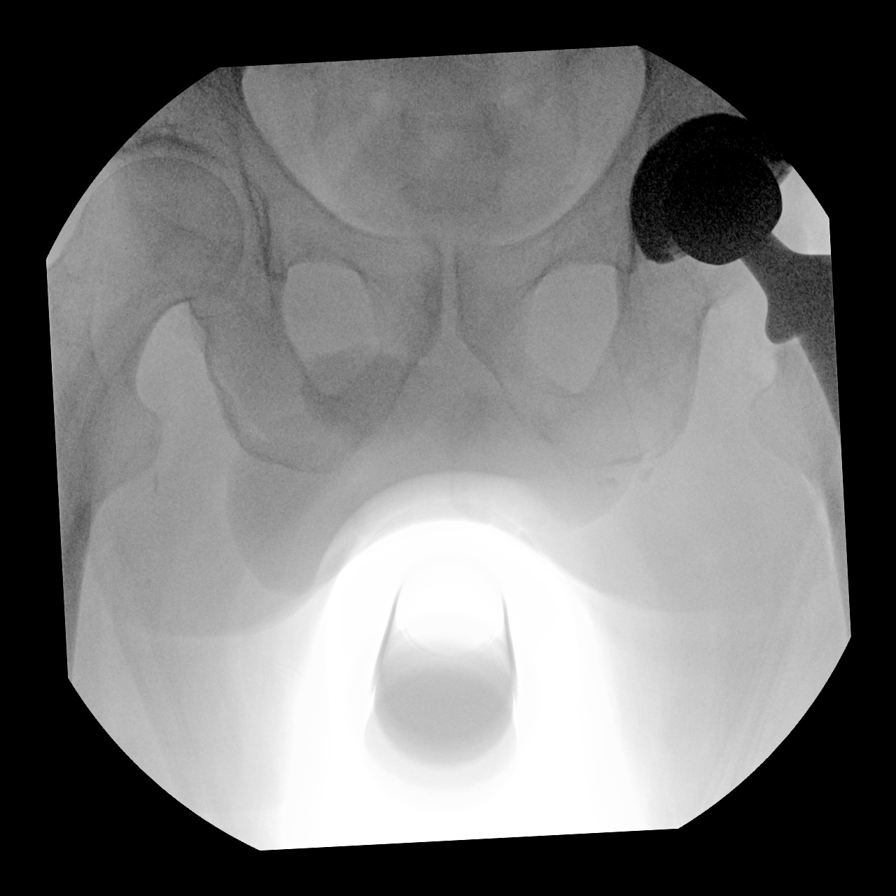
[im 6/6]
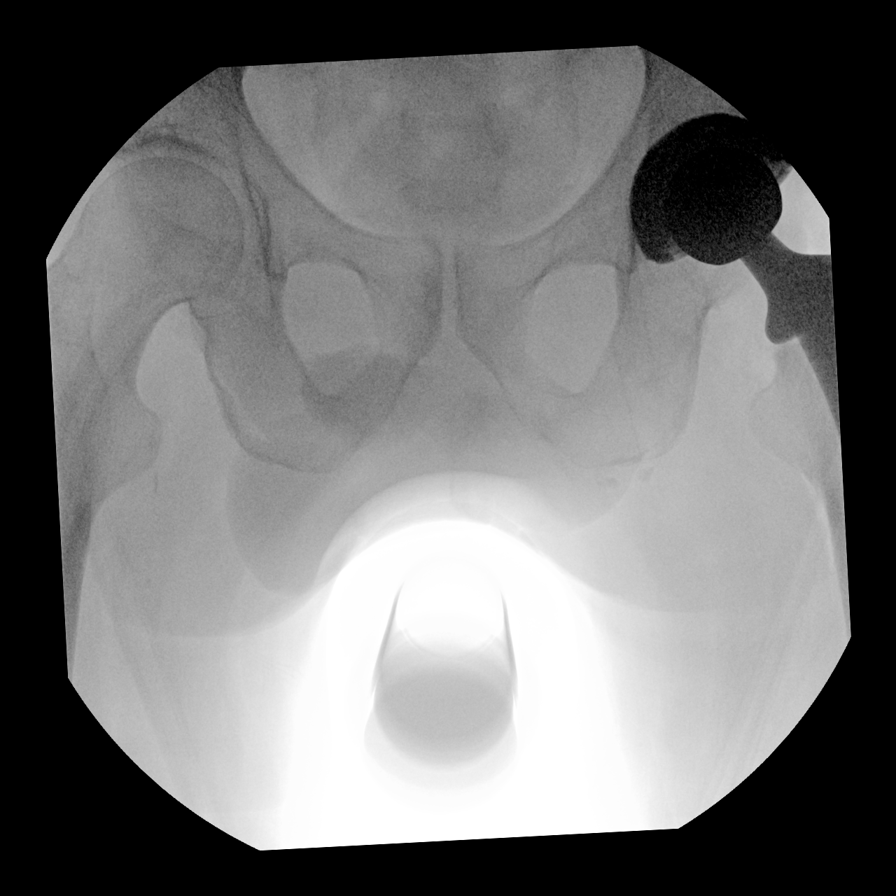

[6 of 6 positions shown; findings below may reference images not displayed]

FINDINGS: Multiple intraoperative spot images demonstrate changes of left hip
replacement. Normal AP alignment. No hardware or bony complicating
feature.
IMPRESSION: Left hip replacement.  No complicating feature.

## 2019-03-10 DIAGNOSIS — E782 Mixed hyperlipidemia: Secondary | ICD-10-CM | POA: Diagnosis not present

## 2019-03-10 DIAGNOSIS — E1165 Type 2 diabetes mellitus with hyperglycemia: Secondary | ICD-10-CM | POA: Diagnosis not present

## 2019-04-04 ENCOUNTER — Other Ambulatory Visit: Payer: Self-pay | Admitting: Internal Medicine

## 2019-04-13 NOTE — Progress Notes (Deleted)
Subjective:   Nathan Frazier is a 68 y.o. male who presents for Medicare Annual/Subsequent preventive examination.  Review of Systems:  No ROS.  Medicare Wellness Visit. Additional risk factors are reflected in the social history.    Sleep patterns: {SX; SLEEP PATTERNS:18802::"feels rested on waking","does not get up to void","gets up *** times nightly to void","sleeps *** hours nightly"}.    Home Safety/Smoke Alarms: Feels safe in home. Smoke alarms in place.  Living environment; residence and Firearm Safety: {Rehab home environment / accessibility:30080::"no firearms","firearms stored safely"}. Seat Belt Safety/Bike Helmet: Wears seat belt.   PSA-  Lab Results  Component Value Date   PSA 2.67 07/24/2011   PSA 1.58 03/21/2010   PSA 1.84 03/06/2008        Objective:    Vitals: There were no vitals taken for this visit.  There is no height or weight on file to calculate BMI.  Advanced Directives 11/19/2018 11/12/2018 11/13/2017 09/15/2014  Does Patient Have a Medical Advance Directive? No No No No  Would patient like information on creating a medical advance directive? No - Patient declined No - Patient declined No - Patient declined -    Tobacco Social History   Tobacco Use  Smoking Status Never Smoker  Smokeless Tobacco Never Used     Counseling given: Not Answered  Past Medical History:  Diagnosis Date  . Allergic rhinitis   . Arthritis   . Familial tremor   . Fracture    right thumb, right elbow  . Other and unspecified hyperlipidemia   . Special screening for malignant neoplasms, unspecified intestine   . Type II or unspecified type diabetes mellitus without mention of complication, not stated as uncontrolled   . Unspecified essential hypertension    Past Surgical History:  Procedure Laterality Date  . THYROID CYST EXCISION  1983  . TOTAL HIP ARTHROPLASTY Left 11/19/2018   Procedure: LEFT TOTAL HIP ARTHROPLASTY ANTERIOR APPROACH;  Surgeon: Mcarthur Rossetti, MD;  Location: [facility];  Service: Orthopedics;  Laterality: Left;  Failed Spinal   Family History  Problem Relation Age of Onset  . Heart disease Maternal Grandfather   . Colon cancer Neg Hx    Social History   Socioeconomic History  . Marital status: Married    Spouse name: Pam Schor  . Number of children: 2  . Years of education: Not on file  . Highest education level: Not on file  Occupational History  . Occupation: Product manager company  Social Needs  . Financial resource strain: Not hard at all  . Food insecurity:    Worry: Not on file    Inability: Not on file  . Transportation needs:    Medical: Not on file    Non-medical: Not on file  Tobacco Use  . Smoking status: Never Smoker  . Smokeless tobacco: Never Used  Substance and Sexual Activity  . Alcohol use: Yes    Alcohol/week: 1.0 standard drinks    Types: 1 Standard drinks or equivalent per week  . Drug use: No  . Sexual activity: Yes    Partners: Female  Lifestyle  . Physical activity:    Days per week: Not on file    Minutes per session: Not on file  . Stress: Not on file  Relationships  . Social connections:    Talks on phone: Not on file    Gets together: Not on file    Attends religious service: Not on file    Active  member of club or organization: Not on file    Attends meetings of clubs or organizations: Not on file    Relationship status: Not on file  Other Topics Concern  . Not on file  Social History Narrative   [place]   Married 1989   Regular exercise: seldom   Caffeine use: coffee daily    Outpatient Encounter Medications as of 04/14/2019  Medication Sig  . acetaminophen (TYLENOL) 500 MG tablet Take 1,000 mg by mouth daily as needed for moderate pain or headache.  Marland Kitchen amoxicillin-clavulanate (AUGMENTIN) 875-125 MG tablet Take 1 tablet by mouth 2 (two) times daily.  Marland Kitchen aspirin 81 MG chewable tablet Chew 1 tablet (81 mg total) by mouth 2 (two)  times daily.  Marland Kitchen atorvastatin (LIPITOR) 40 MG tablet Take 40 mg by mouth daily.  Marland Kitchen b complex vitamins tablet Take 1 tablet by mouth daily.  . Canagliflozin-Metformin HCl ER (INVOKAMET XR) [phone] MG TB24 Take 2 tablets by mouth daily.   . Emollient (CETAPHIL) cream Apply 1 application topically as needed (dry skin).  . Liraglutide (VICTOZA) 18 MG/3ML SOPN Inject 1.2 mg into the skin daily.   Marland Kitchen lisinopril (PRINIVIL,ZESTRIL) 20 MG tablet TAKE 1 TABLET BY MOUTH EVERY DAY  . methocarbamol (ROBAXIN) 500 MG tablet Take 1 tablet (500 mg total) by mouth every 6 (six) hours as needed for muscle spasms.  . Multiple Vitamin (MULTIVITAMIN WITH MINERALS) TABS tablet Take 1 tablet by mouth daily.  Marland Kitchen OVER THE COUNTER MEDICATION Take 1 capsule by mouth daily. ALJ dietary supplement  . oxyCODONE (OXY IR/ROXICODONE) 5 MG immediate release tablet Take 1-2 tablets (5-10 mg total) by mouth every 4 (four) hours as needed for moderate pain (pain score 4-6).  . pioglitazone (ACTOS) 15 MG tablet Take 15 mg by mouth daily.  Vladimir Faster Glycol-Propyl Glycol (SYSTANE OP) Place 1 drop into both eyes daily as needed (burning / irritation).  . Propylhexedrine (BENZEDREX NA) Place 1 Dose into both nostrils daily as needed (congestion).   No facility-administered encounter medications on file as of 04/14/2019.     Activities of Daily Living In your present state of health, do you have any difficulty performing the following activities: 11/19/2018 11/12/2018  Hearing? N N  Vision? N N  Difficulty concentrating or making decisions? N N  Walking or climbing stairs? Y N  Dressing or bathing? N N  Doing errands, shopping? N N  Some recent data might be hidden    Patient Care Team: Hoyt Koch, MD as PCP - General (Internal Medicine) [place], [place], [facility])   Assessment:   This is a routine wellness examination for [place]. Physical assessment deferred to PCP.  Exercise Activities and Dietary recommendations    Diet (meal preparation, eat out, water intake, caffeinated beverages, dairy products, fruits and vegetables): {Desc; diets:16563}  Goals    . Patient Stated     Increase my physical activity, start working out at home after work.         Fall Risk Fall Risk  11/16/2018 11/13/2017 09/11/2016 07/20/2014  Falls in the past year? 0 Yes Yes No  Comment Emmi Telephone Survey: data to providers prior to load - - -  Number falls in past yr: - - 2 or more -  Injury with Fall? - - No -   Depression Screen PHQ 2/9 Scores 11/13/2017 09/11/2016 07/20/2014  PHQ - 2 Score 0 0 0  PHQ- 9 Score 0 - -    Cognitive Function MMSE -  Mini Mental State Exam 11/13/2017  Orientation to time 5  Orientation to Place 5  Registration 3  Attention/ Calculation 5  Recall 2  Language- name 2 objects 2  Language- repeat 1  Language- follow 3 step command 3  Language- read & follow direction 1  Write a sentence 1  Copy design 1  Total score 29        Immunization History  Administered Date(s) Administered  . Influenza, High Dose Seasonal PF 11/13/2017  . Influenza-Unspecified 09/20/2018  . Pneumococcal Conjugate-13 07/20/2014  . Pneumococcal Polysaccharide-23 02/03/2013  . Tdap 02/03/2013  . Zoster 08/22/2015   Screening Tests Health Maintenance  Topic Date Due  . OPHTHALMOLOGY EXAM  10/14/2017  . PNA vac Low Risk Adult (2 of 2 - PPSV23) 02/03/2018  . FOOT EXAM  11/13/2018  . HEMOGLOBIN A1C  05/13/2019  . INFLUENZA VACCINE  07/30/2019  . COLONOSCOPY  09/30/2019  . TETANUS/TDAP  02/03/2023  . Hepatitis C Screening  Completed       Plan:     I have personally reviewed and noted the following in the patient's chart:   . Medical and social history . Use of alcohol, tobacco or illicit drugs  . Current medications and supplements . Functional ability and status . Nutritional status . Physical activity . Advanced directives . List of other physicians . Vitals . Screenings to  include cognitive, depression, and falls . Referrals and appointments  In addition, I have reviewed and discussed with patient certain preventive protocols, quality metrics, and best practice recommendations. A written personalized care plan for preventive services as well as general preventive health recommendations were provided to patient.     Michiel Cowboy, RN  04/13/2019

## 2019-04-14 ENCOUNTER — Ambulatory Visit: Payer: Medicare Other

## 2019-04-28 ENCOUNTER — Encounter: Payer: Self-pay | Admitting: Orthopaedic Surgery

## 2019-04-28 ENCOUNTER — Encounter: Payer: Self-pay | Admitting: Internal Medicine

## 2019-05-02 ENCOUNTER — Encounter: Payer: Self-pay | Admitting: Internal Medicine

## 2019-05-04 ENCOUNTER — Ambulatory Visit (INDEPENDENT_AMBULATORY_CARE_PROVIDER_SITE_OTHER): Payer: Medicare Other | Admitting: Internal Medicine

## 2019-05-04 ENCOUNTER — Encounter: Payer: Self-pay | Admitting: Internal Medicine

## 2019-05-04 DIAGNOSIS — Z96642 Presence of left artificial hip joint: Secondary | ICD-10-CM | POA: Diagnosis not present

## 2019-05-04 DIAGNOSIS — R21 Rash and other nonspecific skin eruption: Secondary | ICD-10-CM | POA: Diagnosis not present

## 2019-05-04 DIAGNOSIS — J392 Other diseases of pharynx: Secondary | ICD-10-CM | POA: Insufficient documentation

## 2019-05-04 MED ORDER — TRIAMCINOLONE ACETONIDE 0.1 % EX CREA: 1.0000 "application " | TOPICAL_CREAM | Freq: Two times a day (BID) | CUTANEOUS | 0 refills | Status: DC

## 2019-05-04 NOTE — Progress Notes (Signed)
Virtual Visit via Video Note  I connected with Nathan Frazier on 05/04/19 at 10:20 AM EDT by a video enabled telemedicine application and verified that I am speaking with the correct person using two identifiers.  The patient and the provider were at separate locations throughout the entire encounter.   I discussed the limitations of evaluation and management by telemedicine and the availability of in person appointments. The patient expressed understanding and agreed to proceed.  History of Present Illness: The patient is a 68 y.o. man with visit for follow up with several new concerns including rash (small spot near his hip replacement, has tried some old cream which is likely >63 year old, this did not help, used to be very small and now size of a bb, red and flat) and throat spasm (going on for 15-20 years, used to be very rare, will hit him and then he gets sensation of throat closing and coughing, cannot breathe well, tries to drink liquids and sometimes a cough drop will help, not often, more during pollen season, has not sought evaluation prior, was worried about cancer in his throat and wanted evaluated, non-smoker), and recent hip replacement (doing well, done with PT and back to normal activities, still mild pain at times, no swelling).  Observations/Objective: Appearance: normal, breathing appears normal, skin with small lesion near left thigh, appears red and flat, normal grooming, abdomen does not appear distended, throat normal, memory normal, mental status is A and O times 3, EOM intact bilaterally  Assessment and Plan: See problem oriented charting  Follow Up Instructions: referral to ENT, rx for triamcinolone ointment  I discussed the assessment and treatment plan with the patient. The patient was provided an opportunity to ask questions and all were answered. The patient agreed with the plan and demonstrated an understanding of the instructions.   The patient was advised to call  back or seek an in-person evaluation if the symptoms worsen or if the condition fails to improve as anticipated.  Hoyt Koch, MD

## 2019-05-04 NOTE — Assessment & Plan Note (Signed)
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 No concerning features. He is aware that this may be delayed due to the pandemic.

## 2019-05-04 NOTE — Assessment & Plan Note (Signed)
Healing well and no apparent complications. No residual pain and not taking any pain medications. Med list updated.

## 2019-05-04 NOTE — Assessment & Plan Note (Signed)
Rx for triamcinolone ointment.

## 2019-05-17 DIAGNOSIS — J392 Other diseases of pharynx: Secondary | ICD-10-CM | POA: Diagnosis not present

## 2019-05-17 DIAGNOSIS — K219 Gastro-esophageal reflux disease without esophagitis: Secondary | ICD-10-CM | POA: Diagnosis not present

## 2019-05-17 DIAGNOSIS — Z7289 Other problems related to lifestyle: Secondary | ICD-10-CM | POA: Diagnosis not present

## 2019-06-23 DIAGNOSIS — E782 Mixed hyperlipidemia: Secondary | ICD-10-CM | POA: Diagnosis not present

## 2019-06-23 DIAGNOSIS — E1165 Type 2 diabetes mellitus with hyperglycemia: Secondary | ICD-10-CM | POA: Diagnosis not present

## 2019-06-30 ENCOUNTER — Other Ambulatory Visit: Payer: Self-pay | Admitting: Internal Medicine

## 2019-07-05 DIAGNOSIS — I10 Essential (primary) hypertension: Secondary | ICD-10-CM | POA: Diagnosis not present

## 2019-07-05 DIAGNOSIS — E1165 Type 2 diabetes mellitus with hyperglycemia: Secondary | ICD-10-CM | POA: Diagnosis not present

## 2019-07-05 DIAGNOSIS — R809 Proteinuria, unspecified: Secondary | ICD-10-CM | POA: Diagnosis not present

## 2019-07-05 DIAGNOSIS — E1142 Type 2 diabetes mellitus with diabetic polyneuropathy: Secondary | ICD-10-CM | POA: Diagnosis not present

## 2019-07-05 DIAGNOSIS — E782 Mixed hyperlipidemia: Secondary | ICD-10-CM | POA: Diagnosis not present

## 2019-07-06 ENCOUNTER — Ambulatory Visit (INDEPENDENT_AMBULATORY_CARE_PROVIDER_SITE_OTHER): Payer: Medicare Other

## 2019-07-06 ENCOUNTER — Other Ambulatory Visit: Payer: Self-pay

## 2019-07-06 ENCOUNTER — Encounter: Payer: Self-pay | Admitting: Orthopaedic Surgery

## 2019-07-06 ENCOUNTER — Ambulatory Visit (INDEPENDENT_AMBULATORY_CARE_PROVIDER_SITE_OTHER): Payer: Medicare Other | Admitting: Orthopaedic Surgery

## 2019-07-06 DIAGNOSIS — Z96642 Presence of left artificial hip joint: Secondary | ICD-10-CM | POA: Diagnosis not present

## 2019-07-06 NOTE — Progress Notes (Signed)
The patient is a 68 year old gentleman who is now 8 months status post a left total hip arthroplasty.  He says he is doing very well overall.  He has been having little bit of left knee pain especially going up stairs.  He says the hip though is fine.  On exam his ligaments are equal.  He is walking without an assistive device.  He has no limp as well.  Both his right native hip and left operative hip move fluidly and are the same in terms of rotation.  He has no pain on the left operative side.  His leg lengths are equal.  Examination of his left knee show some slight patellofemoral arthritic changes with crepitation.  He is got good motion overall of the knee with slight varus malalignment.  An AP pelvis and lateral left hip shows a well-seated total hip arthroplasty with no complicating features.  There is no evidence of loosening.  His right hip appears normal.  At this point follow-up as needed.  If he starts having issues with his left knee at all he will let us know.  I did talk to her about quad strengthening exercises.  I talked to him also about the things that he needs to watch for his left total hip.  All question concerns were answered and addressed.  Follow-up as otherwise as needed.

## 2019-09-09 ENCOUNTER — Other Ambulatory Visit: Payer: Self-pay | Admitting: Internal Medicine

## 2019-09-24 ENCOUNTER — Other Ambulatory Visit: Payer: Self-pay | Admitting: Internal Medicine

## 2019-09-29 DIAGNOSIS — E782 Mixed hyperlipidemia: Secondary | ICD-10-CM | POA: Diagnosis not present

## 2019-09-29 DIAGNOSIS — E1165 Type 2 diabetes mellitus with hyperglycemia: Secondary | ICD-10-CM | POA: Diagnosis not present

## 2019-10-03 ENCOUNTER — Encounter: Payer: Self-pay | Admitting: Gastroenterology

## 2019-10-04 ENCOUNTER — Encounter: Payer: Self-pay | Admitting: Gastroenterology

## 2019-10-06 DIAGNOSIS — E1165 Type 2 diabetes mellitus with hyperglycemia: Secondary | ICD-10-CM | POA: Diagnosis not present

## 2019-10-06 DIAGNOSIS — R809 Proteinuria, unspecified: Secondary | ICD-10-CM | POA: Diagnosis not present

## 2019-10-06 DIAGNOSIS — E1142 Type 2 diabetes mellitus with diabetic polyneuropathy: Secondary | ICD-10-CM | POA: Diagnosis not present

## 2019-10-06 DIAGNOSIS — E1122 Type 2 diabetes mellitus with diabetic chronic kidney disease: Secondary | ICD-10-CM | POA: Diagnosis not present

## 2019-10-06 DIAGNOSIS — Z79899 Other long term (current) drug therapy: Secondary | ICD-10-CM | POA: Diagnosis not present

## 2019-10-06 DIAGNOSIS — E782 Mixed hyperlipidemia: Secondary | ICD-10-CM | POA: Diagnosis not present

## 2019-10-06 DIAGNOSIS — I129 Hypertensive chronic kidney disease with stage 1 through stage 4 chronic kidney disease, or unspecified chronic kidney disease: Secondary | ICD-10-CM | POA: Diagnosis not present

## 2019-10-06 DIAGNOSIS — N182 Chronic kidney disease, stage 2 (mild): Secondary | ICD-10-CM | POA: Diagnosis not present

## 2019-10-21 ENCOUNTER — Ambulatory Visit: Payer: Medicare Other | Admitting: Internal Medicine

## 2019-11-04 ENCOUNTER — Other Ambulatory Visit: Payer: Self-pay

## 2019-11-04 ENCOUNTER — Ambulatory Visit (AMBULATORY_SURGERY_CENTER): Payer: Self-pay

## 2019-11-04 VITALS — Temp 96.6°F | Ht 68.0 in | Wt 172.8 lb

## 2019-11-04 DIAGNOSIS — Z86010 Personal history of colonic polyps: Secondary | ICD-10-CM

## 2019-11-04 NOTE — Progress Notes (Signed)
Denies allergies to eggs or soy products. Denies complication of anesthesia or sedation. Denies use of weight loss medication. Denies use of O2.   Emmi instructions given for colonoscopy.  Covid Screening is scheduled for 11/15/19 @10 :40 Am.

## 2019-11-15 ENCOUNTER — Other Ambulatory Visit: Payer: Self-pay | Admitting: Gastroenterology

## 2019-11-15 ENCOUNTER — Ambulatory Visit (INDEPENDENT_AMBULATORY_CARE_PROVIDER_SITE_OTHER): Payer: Medicare Other

## 2019-11-15 DIAGNOSIS — Z03818 Encounter for observation for suspected exposure to other biological agents ruled out: Secondary | ICD-10-CM | POA: Diagnosis not present

## 2019-11-15 DIAGNOSIS — Z1159 Encounter for screening for other viral diseases: Secondary | ICD-10-CM

## 2019-11-16 LAB — SARS CORONAVIRUS 2 (TAT 6-24 HRS): SARS Coronavirus 2: NEGATIVE

## 2019-11-18 ENCOUNTER — Encounter: Payer: Self-pay | Admitting: Gastroenterology

## 2019-11-18 ENCOUNTER — Ambulatory Visit (AMBULATORY_SURGERY_CENTER): Payer: Medicare Other | Admitting: Gastroenterology

## 2019-11-18 ENCOUNTER — Other Ambulatory Visit: Payer: Self-pay

## 2019-11-18 VITALS — BP 132/83 | HR 72 | Temp 98.4°F | Resp 18 | Ht 68.0 in | Wt 172.0 lb

## 2019-11-18 DIAGNOSIS — E119 Type 2 diabetes mellitus without complications: Secondary | ICD-10-CM | POA: Diagnosis not present

## 2019-11-18 DIAGNOSIS — K635 Polyp of colon: Secondary | ICD-10-CM | POA: Diagnosis not present

## 2019-11-18 DIAGNOSIS — Z86010 Personal history of colonic polyps: Secondary | ICD-10-CM | POA: Diagnosis not present

## 2019-11-18 DIAGNOSIS — I10 Essential (primary) hypertension: Secondary | ICD-10-CM | POA: Diagnosis not present

## 2019-11-18 DIAGNOSIS — D122 Benign neoplasm of ascending colon: Secondary | ICD-10-CM

## 2019-11-18 MED ORDER — SODIUM CHLORIDE 0.9 % IV SOLN: 500.0000 mL | INTRAVENOUS | Status: DC

## 2019-11-18 NOTE — Op Note (Signed)
[facility] Patient Name: Nathan Frazier Procedure Date: 11/18/2019 7:24 AM MRN: [id number] Endoscopist: Milus Banister , MD Age: 68 Referring MD:  Date of Birth: 1951-10-27 Gender: Male Account #: [id number] Procedure:                Colonoscopy Indications:              High risk colon cancer surveillance: Personal                            history of colonic polyps; colonoscopy 2009 3cm TA                            removed; colonoscopy 2010 no polyps; colonoscopy                            2015 no polyps Medicines:                Monitored Anesthesia Care Procedure:                Pre-Anesthesia Assessment:                           - Prior to the procedure, a History and Physical                            was performed, and patient medications and                            allergies were reviewed. The patient's tolerance of                            previous anesthesia was also reviewed. The risks                            and benefits of the procedure and the sedation                            options and risks were discussed with the patient.                            All questions were answered, and informed consent                            was obtained. Prior Anticoagulants: The patient has                            taken no previous anticoagulant or antiplatelet                            agents. ASA Grade Assessment: II - A patient with                            mild systemic disease. After reviewing the risks  and benefits, the patient was deemed in                            satisfactory condition to undergo the procedure.                           After obtaining informed consent, the colonoscope                            was passed under direct vision. Throughout the                            procedure, the patient's blood pressure, pulse, and                            oxygen saturations were monitored continuously. The                             Colonoscope was introduced through the anus and                            advanced to the the cecum, identified by                            appendiceal orifice and ileocecal valve. The                            colonoscopy was performed without difficulty. The                            patient tolerated the procedure well. The quality                            of the bowel preparation was good. The ileocecal                            valve, appendiceal orifice, and rectum were                            photographed. Scope In: 7:46:50 AM Scope Out: 8:01:47 AM Scope Withdrawal Time: 0 hours 11 minutes 4 seconds  Total Procedure Duration: 0 hours 14 minutes 57 seconds  Findings:                 The site of 2009 hepatic flexure polypectomy was                            evident by previous Spot tattoo and was clear of                            recurrent polyp.                           A 7 mm polyp was found in the ascending colon. The  polyp was sessile. The polyp was removed with a                            cold snare. Resection and retrieval were complete.                           Multiple small-mouthed diverticula were found in                            the left colon.                           The exam was otherwise without abnormality on                            direct and retroflexion views. Complications:            No immediate complications. Estimated blood loss:                            None. Estimated Blood Loss:     Estimated blood loss: none. Impression:               - One 7 mm polyp in the ascending colon, removed                            with a cold snare. Resected and retrieved.                           - Diverticulosis in the left colon.                           - The examination was otherwise normal on direct                            and retroflexion views. Recommendation:           - Patient has  a contact number available for                            emergencies. The signs and symptoms of potential                            delayed complications were discussed with the                            patient. Return to normal activities tomorrow.                            Written discharge instructions were provided to the                            patient.                           - Resume previous diet.                           -  Continue present medications.                           - Await pathology results. Milus Banister, MD 11/18/2019 8:05:30 AM This report has been signed electronically.

## 2019-11-18 NOTE — Progress Notes (Signed)
Called to room to assist during endoscopic procedure.  Patient ID and intended procedure confirmed with present staff. Received instructions for my participation in the procedure from the performing physician.  

## 2019-11-18 NOTE — Progress Notes (Signed)
Report to PACU, RN, vss, BBS= Clear.  

## 2019-11-18 NOTE — Progress Notes (Signed)
Temp JB  v/s KA I have reviewed the patient's medical history in detail and updated the computerized patient record.

## 2019-11-18 NOTE — Patient Instructions (Signed)
HANDOUTS GIVEN FOR POLYPS AND DIVERTICULOSIS.  YOU HAD AN ENDOSCOPIC PROCEDURE TODAY AT [facility]:   Refer to the procedure report that was given to you for any specific questions about what was found during the examination.  If the procedure report does not answer your questions, please call your gastroenterologist to clarify.  If you requested that your care partner not be given the details of your procedure findings, then the procedure report has been included in a sealed envelope for you to review at your convenience later.  YOU SHOULD EXPECT: Some feelings of bloating in the abdomen. Passage of more gas than usual.  Walking can help get rid of the air that was put into your GI tract during the procedure and reduce the bloating. If you had a lower endoscopy (such as a colonoscopy or flexible sigmoidoscopy) you may notice spotting of blood in your stool or on the toilet paper. If you underwent a bowel prep for your procedure, you may not have a normal bowel movement for a few days.  Please Note:  You might notice some irritation and congestion in your nose or some drainage.  This is from the oxygen used during your procedure.  There is no need for concern and it should clear up in a day or so.  SYMPTOMS TO REPORT IMMEDIATELY:   Following lower endoscopy (colonoscopy or flexible sigmoidoscopy):  Excessive amounts of blood in the stool  Significant tenderness or worsening of abdominal pains  Swelling of the abdomen that is new, acute  Fever of 100F or higher  For urgent or emergent issues, a gastroenterologist can be reached at any hour by calling [phone].   DIET:  We do recommend a small meal at first, but then you may proceed to your regular diet.  Drink plenty of fluids but you should avoid alcoholic beverages for 24 hours.  ACTIVITY:  You should plan to take it easy for the rest of today and you should NOT DRIVE or use heavy machinery until tomorrow (because of  the sedation medicines used during the test).    FOLLOW UP: Our staff will call the number listed on your records 48-72 hours following your procedure to check on you and address any questions or concerns that you may have regarding the information given to you following your procedure. If we do not reach you, we will leave a message.  We will attempt to reach you two times.  During this call, we will ask if you have developed any symptoms of COVID 19. If you develop any symptoms (ie: fever, flu-like symptoms, shortness of breath, cough etc.) before then, please call [phone].  If you test positive for Covid 19 in the 2 weeks post procedure, please call and report this information to [place].    If any biopsies were taken you will be contacted by phone or by letter within the next 1-3 weeks.  Please call us at [phone] if you have not heard about the biopsies in 3 weeks.    SIGNATURES/CONFIDENTIALITY: You and/or your care partner have signed paperwork which will be entered into your electronic medical record.  These signatures attest to the fact that that the information above on your After Visit Summary has been reviewed and is understood.  Full responsibility of the confidentiality of this discharge information lies with you and/or your care-partner.

## 2019-11-22 ENCOUNTER — Telehealth: Payer: Self-pay | Admitting: *Deleted

## 2019-11-22 NOTE — Telephone Encounter (Signed)
  Follow up Call-  Call back number 11/18/2019  Post procedure Call Back phone  # [phone]  Permission to leave phone message Yes  Some recent data might be hidden     Patient questions:  Do you have a fever, pain , or abdominal swelling? No. Pain Score  0 *  Have you tolerated food without any problems? Yes.    Have you been able to return to your normal activities? Yes.    Do you have any questions about your discharge instructions: Diet   No. Medications  No. Follow up visit  No.  Do you have questions or concerns about your Care? No.  Actions: * If pain score is 4 or above: No action needed, pain <4.  1. Have you developed a fever since your procedure? no  2.   Have you had an respiratory symptoms (SOB or cough) since your procedure? no  3.   Have you tested positive for COVID 19 since your procedure no  4.   Have you had any family members/close contacts diagnosed with the COVID 19 since your procedure?  no   If yes to any of these questions please route to Joylene John, RN and Alphonsa Gin, Therapist, sports.

## 2019-11-23 ENCOUNTER — Other Ambulatory Visit: Payer: Self-pay

## 2019-11-26 ENCOUNTER — Encounter: Payer: Self-pay | Admitting: Gastroenterology

## 2019-12-27 ENCOUNTER — Other Ambulatory Visit: Payer: Self-pay | Admitting: Internal Medicine

## 2020-01-03 DIAGNOSIS — E782 Mixed hyperlipidemia: Secondary | ICD-10-CM | POA: Diagnosis not present

## 2020-01-03 DIAGNOSIS — E1165 Type 2 diabetes mellitus with hyperglycemia: Secondary | ICD-10-CM | POA: Diagnosis not present

## 2020-01-06 DIAGNOSIS — E1122 Type 2 diabetes mellitus with diabetic chronic kidney disease: Secondary | ICD-10-CM | POA: Diagnosis not present

## 2020-01-06 DIAGNOSIS — I129 Hypertensive chronic kidney disease with stage 1 through stage 4 chronic kidney disease, or unspecified chronic kidney disease: Secondary | ICD-10-CM | POA: Diagnosis not present

## 2020-01-06 DIAGNOSIS — N182 Chronic kidney disease, stage 2 (mild): Secondary | ICD-10-CM | POA: Diagnosis not present

## 2020-01-06 DIAGNOSIS — E782 Mixed hyperlipidemia: Secondary | ICD-10-CM | POA: Diagnosis not present

## 2020-01-06 DIAGNOSIS — E1165 Type 2 diabetes mellitus with hyperglycemia: Secondary | ICD-10-CM | POA: Diagnosis not present

## 2020-01-06 DIAGNOSIS — R809 Proteinuria, unspecified: Secondary | ICD-10-CM | POA: Diagnosis not present

## 2020-01-06 DIAGNOSIS — E1142 Type 2 diabetes mellitus with diabetic polyneuropathy: Secondary | ICD-10-CM | POA: Diagnosis not present

## 2020-01-06 DIAGNOSIS — Z7984 Long term (current) use of oral hypoglycemic drugs: Secondary | ICD-10-CM | POA: Diagnosis not present

## 2020-03-02 ENCOUNTER — Telehealth: Payer: Self-pay | Admitting: Internal Medicine

## 2020-03-02 DIAGNOSIS — E118 Type 2 diabetes mellitus with unspecified complications: Secondary | ICD-10-CM

## 2020-03-02 NOTE — Chronic Care Management (AMB) (Signed)
  Chronic Care Management   Note  03/02/2020 Name: Nathan Frazier MRN: [id number] DOB: 03-09-1951  Nathan Frazier is a 69 y.o. year old male who is a primary care patient of Hoyt Koch, MD. I reached out to [name] by phone today in response to a referral sent by Nathan Frazier's PCP, Hoyt Koch, MD.   [name] was given information about Chronic Care Management services today including:  1. CCM service includes personalized support from designated clinical staff supervised by his physician, including individualized plan of care and coordination with other care providers 2. 24/7 contact phone numbers for assistance for urgent and routine care needs. 3. Service will only be billed when office clinical staff spend 20 minutes or more in a month to coordinate care. 4. Only one practitioner may furnish and bill the service in a calendar month. 5. The patient may stop CCM services at any time (effective at the end of the month) by phone call to the office staff.   Patient agreed to services and verbal consent obtained.   Follow up plan:   Raynicia Dukes [employer] Scheduler

## 2020-03-02 NOTE — Progress Notes (Signed)
Patient asked for a call back later, he needed to check his calendar before scheduling appt w/ CCM.    Nathan Frazier [employer] Scheduler

## 2020-03-21 ENCOUNTER — Other Ambulatory Visit: Payer: Self-pay | Admitting: Internal Medicine

## 2020-03-23 NOTE — Addendum Note (Signed)
Addended by: Karle Barr on: 03/23/2020 07:44 PM   Modules accepted: Orders

## 2020-03-26 ENCOUNTER — Other Ambulatory Visit: Payer: Self-pay

## 2020-03-26 ENCOUNTER — Ambulatory Visit: Payer: Medicare Other | Admitting: Pharmacist

## 2020-03-26 DIAGNOSIS — I10 Essential (primary) hypertension: Secondary | ICD-10-CM

## 2020-03-26 DIAGNOSIS — E785 Hyperlipidemia, unspecified: Secondary | ICD-10-CM

## 2020-03-26 DIAGNOSIS — E1169 Type 2 diabetes mellitus with other specified complication: Secondary | ICD-10-CM

## 2020-03-26 DIAGNOSIS — E118 Type 2 diabetes mellitus with unspecified complications: Secondary | ICD-10-CM

## 2020-03-26 NOTE — Chronic Care Management (AMB) (Signed)
Chronic Care Management Pharmacy  Name: Nathan Frazier  MRN: [id number] DOB: 1951-08-30   Chief Complaint/ HPI  Nathan Frazier,  69 y.o. , male presents for their Initial CCM visit with the clinical pharmacist via telephone due to COVID-19 Pandemic.  PCP : Hoyt Koch, MD  Their chronic conditions include: HTN, T2DM, HLD osteoarthritis, allergies  Office Visits: 05/04/19 Dr Sharlet Salina [facility]: c/o rash near THA site, throat spasm. Referred to ENT and rx'd triamcinolone cream.   Consult Visit: 01/06/20 Dr Altheimer [facility]): switched Invokamet to [facility] d/t formulary. Goal A1c < 6.5%  10/06/19 Dr Altheimer ([facility]): no med changes.  07/06/19 Dr Ninfa Linden (Ortho): 8 months s/p L THA. Doing well, f/u prn.   Medications: Outpatient Encounter Medications as of 03/26/2020  Medication Sig  . acetaminophen (TYLENOL) 500 MG tablet Take 1,000 mg by mouth daily as needed for moderate pain or headache.  Marland Kitchen aspirin 81 MG chewable tablet Chew 1 tablet (81 mg total) by mouth 2 (two) times daily.  Marland Kitchen atorvastatin (LIPITOR) 40 MG tablet Take 40 mg by mouth daily.  Marland Kitchen b complex vitamins tablet Take 1 tablet by mouth daily.  . Emollient (CETAPHIL) cream Apply 1 application topically as needed (dry skin).  . Empagliflozin-metFORMIN HCl ER (SYNJARDY XR) 12.04-999 MG TB24 Take 2 tablets by mouth daily.  . Liraglutide (VICTOZA) 18 MG/3ML SOPN Inject 1.2 mg into the skin daily.   Marland Kitchen lisinopril (ZESTRIL) 20 MG tablet TAKE 1 TABLET BY MOUTH EVERY DAY  . Multiple Vitamin (MULTIVITAMIN WITH MINERALS) TABS tablet Take 1 tablet by mouth daily.  . pioglitazone (ACTOS) 15 MG tablet Take 15 mg by mouth daily.  Vladimir Faster Glycol-Propyl Glycol (SYSTANE OP) Place 1 drop into both eyes daily as needed (burning / irritation).  . polyethylene glycol powder (GLYCOLAX/MIRALAX) 17 GM/SCOOP powder Take 1 Container by mouth once.  Marland Kitchen Propylhexedrine (BENZEDREX NA) Place 1 Dose into both nostrils daily as needed  (congestion).  . triamcinolone cream (KENALOG) 0.1 % APPLY TO AFFECTED AREA TWICE A DAY  . Canagliflozin-Metformin HCl ER (INVOKAMET XR) [phone] MG TB24 Take 2 tablets by mouth daily.   . methocarbamol (ROBAXIN) 500 MG tablet Take 1 tablet (500 mg total) by mouth every 6 (six) hours as needed for muscle spasms. (Patient not taking: Reported on 11/18/2019)   No facility-administered encounter medications on file as of 03/26/2020.     Current Diagnosis/Assessment:  Goals Addressed            This Visit's Progress   . Pharmacy Care Plan       CARE PLAN ENTRY  Current Barriers:  . Chronic Disease Management support, education, and care coordination needs related to HTN, HLD, and DMII  Pharmacist Clinical Goal(s):  Marland Kitchen Maintain BP < 130/80 . Maintain A1c < 6.5% . Maintain LDL < 100 . Ensure safety, efficacy, and affordability of medications  Interventions: . Comprehensive medication review performed. Marland Kitchen Pursue patient assistance for Synjardy and Victoza . May consider switching to [facility] or mail order pharmacy for reduced copays  Patient Self Care Activities:  . Self administers medications as prescribed, Calls pharmacy for medication refills, and Calls provider office for new concerns or questions  Initial goal documentation        Diabetes   [facility] Labs: 01/03/20: A1c 6.2% 03/10/19 alb/Cr: 159  Recent Relevant Labs: Lab Results  Component Value Date/Time   HGBA1C 6.4 (H) 11/12/2018 09:44 AM   HGBA1C 6.9 12/10/2017 12:00 AM   HGBA1C 6.6  09/11/2017 12:00 AM   MICROALBUR 40.0 (H) 07/26/2015 10:55 AM   MICROALBUR 84.5 (H) 06/29/2013 08:53 AM    Lab Results  Component Value Date   CREATININE 1.16 11/20/2018   CREATININE 1.03 11/12/2018   CREATININE 1.2 12/10/2017    Checking BG: 2x per Day  Recent FBG Readings: 115-125 Recent pre-meal BG readings:  Recent 2hr PP BG readings:   Recent HS BG readings: 147-160  Patient has failed these meds in past: glimepiride,  glipizide, Januvia, Onglyza, Invokamet Patient is currently controlled on the following medications: Synjardy 12.04-999 mg - 2 daily, Victoza 1.2 mg SQ daily, pioglitazone 15 mg daily   Last diabetic Foot exam:  Lab Results  Component Value Date/Time   HMDIABEYEEXA No Retinopathy 10/14/2016 12:00 AM    Last diabetic Eye exam:  Lab Results  Component Value Date/Time   HMDIABFOOTEX yes 03/26/2010 12:00 AM     We discussed: diet and exercise extensively,  Pt has lost ~25 lbs over last couple years due to diet changes. Pt reports Iva Boop has caused some GI discomfort but he is dealing with it. Discussed possibly splitting dose to 1 tab AM and 1 tab PM to reduce GI effects but pt wants to keep taking 2 tabs in AM.   Pt reports Synjardy and Victoza are both over $200, he is not sure if he has a deductible or if he is already in coverage gap. Discussed patient assistance, will pursue for both.   Plan  Continue current medications and control with diet and exercise  Pursue patient assistance for Gilford Rile  Hypertension   Office blood pressures are  BP Readings from Last 3 Encounters:  11/18/19 132/83  01/24/19 (!) 150/84  11/21/18 (!) 144/94    Patient has failed these meds in the past: n/a Patient is currently controlled on the following medications: lisinopril 20 mg daily  Patient checks BP at home infrequently  We discussed diet and exercise extensively - he lost 25 lbs, changed attitude about work to reduce stress, Cut back on potatoes, bread, soft drinks, just eating less in general.   Plan  Continue current medications and control with diet and exercise    Hyperlipidemia   [facility] labs (01/03/20):  Chol: 166 Trig: 136 HDL: 40 LDL direct: 98  Lipid Panel     Component Value Date/Time   CHOL 185 12/10/2017 0000   TRIG 192 (A) 12/10/2017 0000   HDL 36 12/10/2017 0000   LDLCALC 111 12/10/2017 0000     The 10-year ASCVD risk score Mikey Bussing DC Jr., et al., 2013)  is: 40.4%   Values used to calculate the score:     Age: 26 years     Sex: Male     Is Non-Hispanic African American: No     Diabetic: Yes     Tobacco smoker: No     Systolic Blood Pressure: [id number] mmHg     Is BP treated: Yes     HDL Cholesterol: 40 MG/DL     Total Cholesterol: 166 MG/DL   Patient has failed these meds in past: Welchol, rosuvastatin, ezetimibe, pravastatin Patient is currently controlled on the following medications: atorvastatin 40 mg daily, aspirin 81 mg daily  We discussed:  diet and exercise extensively, benefits of statin including ASCVD prevention.   Plan  Continue current medications and control with diet and exercise    Pain   Patient has tried these meds in past: methocarbamol Patient is currently controlled on the following medications: Tylenol PRN  We discussed:  Pt used muscle relaxer once after hip surgery, uses Tylenol infrequently for aches/pains and headaches.  Plan  Continue current medications   Health Maintenance   Patient is currently controlled on the following medications: Tylenol 500 mg prn, Vitamin B complex, MVA, Cetaphil cream, triamcinolone cream, systane eye drops, ALJ sinus  We discussed:  Patient is satisfied with current OTC regimen and denies issues. He reports ALJ sinus product has significantly improved sinus headaches and rhinorrhea.   Plan  Continue current medications   Medication Management   [facility] Does not use pill box Pt endorses 100% compliance  We discussed:  [facility] with [facility], [facility] or mail order preferred. We are pursuing PAP for brand products, but pt would rather not switch pharmacies for other generic drugs if he will just save a few dollars. [facility]  Plan  Continue current medication management strategy     Follow up: 3 month phone visit  Charlene Brooke, PharmD Clinical Pharmacist [facility] at [facility] [phone]

## 2020-03-26 NOTE — Patient Instructions (Addendum)
Visit Information  Thank you for meeting with me to discuss your medications! I look forward to working with you to achieve your health care goals. Below is a summary of what we talked about during the visit:  Goals Addressed            This Visit's Progress   . Pharmacy Care Plan       CARE PLAN ENTRY  Current Barriers:  . Chronic Disease Management support, education, and care coordination needs related to HTN, HLD, and DMII  Pharmacist Clinical Goal(s):  Marland Kitchen Maintain BP < 130/80 . Maintain A1c < 6.5% . Maintain LDL < 100 . Ensure safety, efficacy, and affordability of medications  Interventions: . Comprehensive medication review performed. Marland Kitchen Pursue patient assistance for Synjardy and Victoza . May consider switching to [facility] or mail order pharmacy for reduced copays  Patient Self Care Activities:  . Self administers medications as prescribed, Calls pharmacy for medication refills, and Calls provider office for new concerns or questions  Initial goal documentation       Mr. Malonzo was given information about Chronic Care Management services today including:  1. CCM service includes personalized support from designated clinical staff supervised by his physician, including individualized plan of care and coordination with other care providers 2. 24/7 contact phone numbers for assistance for urgent and routine care needs. 3. Standard insurance, coinsurance, copays and deductibles apply for chronic care management only during months in which we provide at least 20 minutes of these services. Most insurances cover these services at 100%, however patients may be responsible for any copay, coinsurance and/or deductible if applicable. This service may help you avoid the need for more expensive face-to-face services. 4. Only one practitioner may furnish and bill the service in a calendar month. 5. The patient may stop CCM services at any time (effective at the end of the month) by  phone call to the office staff.  Patient agreed to services and verbal consent obtained.   The patient verbalized understanding of instructions provided today and agreed to receive a mailed copy of patient instruction and/or educational materials. Telephone follow up appointment with pharmacy team member scheduled for: 3 months  Charlene Brooke, PharmD Clinical Pharmacist [facility] at [facility] [phone]    Health Maintenance, Male Adopting a healthy lifestyle and getting preventive care are important in promoting health and wellness. Ask your health care provider about:  The right schedule for you to have regular tests and exams.  Things you can do on your own to prevent diseases and keep yourself healthy. What should I know about diet, weight, and exercise? Eat a healthy diet   Eat a diet that includes plenty of vegetables, fruits, low-fat dairy products, and lean protein.  Do not eat a lot of foods that are high in solid fats, added sugars, or sodium. Maintain a healthy weight Body mass index (BMI) is a measurement that can be used to identify possible weight problems. It estimates body fat based on height and weight. Your health care provider can help determine your BMI and help you achieve or maintain a healthy weight. Get regular exercise Get regular exercise. This is one of the most important things you can do for your health. Most adults should:  Exercise for at least 150 minutes each week. The exercise should increase your heart rate and make you sweat (moderate-intensity exercise).  Do strengthening exercises at least twice a week. This is in addition to the moderate-intensity exercise.  Spend less time sitting. Even light physical activity can be beneficial. Watch cholesterol and blood lipids Have your blood tested for lipids and cholesterol at 71 years of age, then have this test every 5 years. You may need to have your cholesterol levels checked  more often if:  Your lipid or cholesterol levels are high.  You are older than 71 years of age.  You are at high risk for heart disease. What should I know about cancer screening? Many types of cancers can be detected early and may often be prevented. Depending on your health history and family history, you may need to have cancer screening at various ages. This may include screening for:  Colorectal cancer.  Prostate cancer.  Skin cancer.  Lung cancer. What should I know about heart disease, diabetes, and high blood pressure? Blood pressure and heart disease  High blood pressure causes heart disease and increases the risk of stroke. This is more likely to develop in people who have high blood pressure readings, are of African descent, or are overweight.  Talk with your health care provider about your target blood pressure readings.  Have your blood pressure checked: ? Every 3-5 years if you are 32-99 years of age. ? Every year if you are 66 years old or older.  If you are between the ages of 16 and 84 and are a current or former smoker, ask your health care provider if you should have a one-time screening for abdominal aortic aneurysm (AAA). Diabetes Have regular diabetes screenings. This checks your fasting blood sugar level. Have the screening done:  Once every three years after age 78 if you are at a normal weight and have a low risk for diabetes.  More often and at a younger age if you are overweight or have a high risk for diabetes. What should I know about preventing infection? Hepatitis B If you have a higher risk for hepatitis B, you should be screened for this virus. Talk with your health care provider to find out if you are at risk for hepatitis B infection. Hepatitis C Blood testing is recommended for:  Everyone born from [birth year] through 1967.  Anyone with known risk factors for hepatitis C. Sexually transmitted infections (STIs)  You should be screened each  year for STIs, including gonorrhea and chlamydia, if: ? You are sexually active and are younger than 71 years of age. ? You are older than 71 years of age and your health care provider tells you that you are at risk for this type of infection. ? Your sexual activity has changed since you were last screened, and you are at increased risk for chlamydia or gonorrhea. Ask your health care provider if you are at risk.  Ask your health care provider about whether you are at high risk for HIV. Your health care provider may recommend a prescription medicine to help prevent HIV infection. If you choose to take medicine to prevent HIV, you should first get tested for HIV. You should then be tested every 3 months for as long as you are taking the medicine. Follow these instructions at home: Lifestyle  Do not use any products that contain nicotine or tobacco, such as cigarettes, e-cigarettes, and chewing tobacco. If you need help quitting, ask your health care provider.  Do not use street drugs.  Do not share needles.  Ask your health care provider for help if you need support or information about quitting drugs. Alcohol use  Do not drink alcohol  if your health care provider tells you not to drink.  If you drink alcohol: ? Limit how much you have to 0-2 drinks a day. ? Be aware of how much alcohol is in your drink. In the [place] one drink equals one 12 oz bottle of beer (355 mL), one 5 oz glass of wine (148 mL), or one 1 oz glass of hard liquor (44 mL). General instructions  Schedule regular health, dental, and eye exams.  Stay current with your vaccines.  Tell your health care provider if: ? You often feel depressed. ? You have ever been abused or do not feel safe at home. Summary  Adopting a healthy lifestyle and getting preventive care are important in promoting health and wellness.  Follow your health care provider's instructions about healthy diet, exercising, and getting tested or  screened for diseases.  Follow your health care provider's instructions on monitoring your cholesterol and blood pressure. This information is not intended to replace advice given to you by your health care provider. Make sure you discuss any questions you have with your health care provider. Document Revised: 12/08/2018 Document Reviewed: 12/08/2018 Elsevier Patient Education  2020 [employer].

## 2020-03-28 NOTE — Progress Notes (Signed)
I have collaborated with the care management provider regarding care management and care coordination activities outlined in this encounter and have reviewed this encounter including documentation in the note and care plan. I am certifying that I agree with the content of this note and encounter as supervising physician.  

## 2020-04-22 ENCOUNTER — Other Ambulatory Visit: Payer: Self-pay | Admitting: Internal Medicine

## 2020-05-21 ENCOUNTER — Other Ambulatory Visit: Payer: Self-pay | Admitting: Internal Medicine

## 2020-06-18 ENCOUNTER — Telehealth: Payer: Self-pay | Admitting: Internal Medicine

## 2020-06-18 MED ORDER — LISINOPRIL 20 MG PO TABS: 20.0000 mg | ORAL_TABLET | Freq: Every day | ORAL | 0 refills | Status: DC

## 2020-06-18 NOTE — Telephone Encounter (Signed)
New message:    1.Medication Requested: lisinopril (ZESTRIL) 20 MG tablet 2. Pharmacy (Name, Street, Strong): [id number] [place], [place] 3. On Med List: Yes  4. Last Visit with PCP:   5. Next visit date with PCP:   Agent: Please be advised that RX refills may take up to 3 business days. We ask that you follow-up with your pharmacy.

## 2020-06-22 ENCOUNTER — Other Ambulatory Visit: Payer: Self-pay

## 2020-06-22 ENCOUNTER — Ambulatory Visit: Payer: Medicare Other | Admitting: Pharmacist

## 2020-06-22 NOTE — Chronic Care Management (AMB) (Signed)
Chronic Care Management Pharmacy  Name: Nathan Frazier  MRN: [id number] DOB: [birth year]   Chief Complaint/ HPI  Nathan Frazier,  69 y.o. , male presents for their Follow-Up CCM visit with the clinical pharmacist via telephone due to COVID-19 Pandemic.  PCP : Hoyt Koch, MD  Their chronic conditions include: HTN, T2DM, HLD osteoarthritis, allergies  Office Visits: 05/04/19 Dr Sharlet Salina VV: c/o rash near THA site, throat spasm. Referred to ENT and rx'd triamcinolone cream.   Consult Visit: 01/06/20 Dr Altheimer [facility] endocrine): switched Invokamet to [facility] d/t formulary. Goal A1c < 6.5%  10/06/19 Dr Altheimer ([facility] endocrine): no med changes.  07/06/19 Dr Ninfa Linden (Ortho): 8 months s/p L THA. Doing well, f/u prn.   Medications: Outpatient Encounter Medications as of 06/22/2020  Medication Sig  . acetaminophen (TYLENOL) 500 MG tablet Take 1,000 mg by mouth daily as needed for moderate pain or headache.  Marland Kitchen aspirin 81 MG chewable tablet Chew 1 tablet (81 mg total) by mouth 2 (two) times daily.  Marland Kitchen atorvastatin (LIPITOR) 40 MG tablet Take 40 mg by mouth daily.  Marland Kitchen b complex vitamins tablet Take 1 tablet by mouth daily.  . Emollient (CETAPHIL) cream Apply 1 application topically as needed (dry skin).  . Empagliflozin-metFORMIN HCl ER (SYNJARDY XR) 12.04-999 MG TB24 Take 2 tablets by mouth daily.  . Liraglutide (VICTOZA) 18 MG/3ML SOPN Inject 1.2 mg into the skin daily.   Marland Kitchen lisinopril (ZESTRIL) 20 MG tablet Take 1 tablet (20 mg total) by mouth daily. Overdue for Annual appt w/labs must see provider for future refills  . methocarbamol (ROBAXIN) 500 MG tablet Take 1 tablet (500 mg total) by mouth every 6 (six) hours as needed for muscle spasms.  . Multiple Vitamin (MULTIVITAMIN WITH MINERALS) TABS tablet Take 1 tablet by mouth daily.  . pioglitazone (ACTOS) 15 MG tablet Take 15 mg by mouth daily.  Vladimir Faster Glycol-Propyl Glycol (SYSTANE OP) Place 1 drop into both eyes daily as needed  (burning / irritation).  . Propylhexedrine (BENZEDREX NA) Place 1 Dose into both nostrils daily as needed (congestion).  . triamcinolone cream (KENALOG) 0.1 % APPLY TO AFFECTED AREA TWICE A DAY  . Canagliflozin-Metformin HCl ER (INVOKAMET XR) [phone] MG TB24 Take 2 tablets by mouth daily.   . polyethylene glycol powder (GLYCOLAX/MIRALAX) 17 GM/SCOOP powder Take 1 Container by mouth once.   No facility-administered encounter medications on file as of 06/22/2020.     Current Diagnosis/Assessment:  SDOH Interventions     Most Recent Value  SDOH Interventions  Financial Strain Interventions Other (Comment)  [Synjardy and Victoza PAP]      Goals Addressed            This Visit's Progress   . Pharmacy Care Plan       CARE PLAN ENTRY (see longitudinal plan of care for additional care plan information)  Current Barriers:  . Chronic Disease Management support, education, and care coordination needs related to Hypertension, Hyperlipidemia, and Diabetes   Hypertension BP Readings from Last 3 Encounters:  11/18/19 132/83  01/24/19 (!) 150/84  11/21/18 (!) 144/94 .  Pharmacist Clinical Goal(s): o Over the next 90 days, patient will work with PharmD and providers to maintain BP goal <130/80 . Current regimen:  o Lisinopril 20 mg daily . Interventions: o Discussed BP goals and benefits of medication for prevention of heart attack / stroke . Patient self care activities - Over the next 90 days, patient will: o Check BP once a week,  document, and provide at future appointments o Ensure daily salt intake < 2300 mg/day  Hyperlipidemia Lab Results  Component Value Date/Time   LDLCALC 111 12/10/2017 12:00 AM   LDLDIRECT 143.3 07/20/2014 04:19 PM .  Pharmacist Clinical Goal(s): o Over the next 90 days, patient will work with PharmD and providers to maintain LDL goal < 100 . Current regimen:  o atorvastatin 40 mg daily,  o aspirin 81 mg daily . Interventions: o Discussed  cholesterol goals and benefits of medication for prevention of heart attack / stroke . Patient self care activities - Over the next 90 days, patient will: o Continue medications as prescribed o Continue low cholesterol diet and exercise routine  Diabetes Lab Results  Component Value Date/Time   HGBA1C 6.4 (H) 11/12/2018 09:44 AM   HGBA1C 6.9 12/10/2017 12:00 AM   HGBA1C 6.6 09/11/2017 12:00 AM .  Pharmacist Clinical Goal(s): o Over the next 90 days, patient will work with PharmD and providers to maintain A1c goal <7% . Current regimen:  o Synjardy 12.04-999 mg - 2 daily,  o Victoza 1.2 mg SQ daily,  o pioglitazone 15 mg daily  . Interventions: o Pursue PAP for Synjardy and Victoza . Patient self care activities - Over the next 90 days, patient will: o Check blood sugar once daily and in the morning before eating or drinking, document, and provide at future appointments o Contact provider with any episodes of hypoglycemia  Medication management . Pharmacist Clinical Goal(s): o Over the next 90 days, patient will work with PharmD and providers to maintain optimal medication adherence . [facility] . Interventions o Comprehensive medication review performed. o Continue current medication management strategy . Patient self care activities - Over the next 90 days, patient will: o Focus on medication adherence by fill date o Take medications as prescribed o Report any questions or concerns to PharmD and/or provider(s)  Please see past updates related to this goal by clicking on the "Past Updates" button in the selected goal         Diabetes   [facility] Labs: 01/03/20: A1c 6.2% 03/10/19 alb/Cr: 159  Recent Relevant Labs: Lab Results  Component Value Date/Time   HGBA1C 6.4 (H) 11/12/2018 09:44 AM   HGBA1C 6.9 12/10/2017 12:00 AM   HGBA1C 6.6 09/11/2017 12:00 AM   MICROALBUR 40.0 (H) 07/26/2015 10:55 AM   MICROALBUR 84.5 (H) 06/29/2013 08:53 AM    Lab Results    Component Value Date   CREATININE 1.16 11/20/2018   CREATININE 1.03 11/12/2018   CREATININE 1.2 12/10/2017   Checking BG: 2x per Day  Recent FBG Readings: 115-125 Recent pre-meal BG readings:  Recent 2hr PP BG readings:   Recent HS BG readings: 147-160  Last diabetic Foot exam:  Lab Results  Component Value Date/Time   HMDIABEYEEXA No Retinopathy 10/14/2016 12:00 AM    Last diabetic Eye exam:  Lab Results  Component Value Date/Time   HMDIABFOOTEX yes 03/26/2010 12:00 AM     Patient has failed these meds in past: glimepiride, glipizide, Januvia, Onglyza, Invokamet Patient is currently controlled on the following medications:   Synjardy 12.04-999 mg - 2 daily,   Victoza 1.2 mg SQ daily,   pioglitazone 15 mg daily   We discussed: Pt has received Synjardy and Victoza PAP however he has not mailed them yet. He has still been paying ~$200 for each medication. Pt agreed to mail them at earliest convenience.  Pt also reports his BG has been more labile with [employer]  compared to Invokamet, 85-157. Discussed sugar content in juice that he drinks daily; pt would like to f/u with endocrine before adjusting any meds.  Plan  Continue current medications and control with diet and exercise  Pursue patient assistance for Gilford Rile  Hypertension   Office blood pressures are  BP Readings from Last 3 Encounters:  11/18/19 132/83  01/24/19 (!) 150/84  11/21/18 (!) 144/94   Patient has failed these meds in the past: n/a Patient is currently controlled on the following medications:   lisinopril 20 mg daily  Patient checks BP at home infrequently  We discussed diet and exercise extensively - he lost 25 lbs, changed attitude about work to reduce stress, Cut back on potatoes, bread, soft drinks, just eating less in general.   Plan  Continue current medications and control with diet and exercise    Hyperlipidemia   [facility] labs (01/03/20):  Chol: 166 Trig: 136 HDL:  40 LDL direct: 98  LDL goal < 100  Lipid Panel     Component Value Date/Time   CHOL 185 12/10/2017 0000   TRIG 192 (A) 12/10/2017 0000   TRIG 146 01/06/2007 0828   HDL 36 12/10/2017 0000   CHOLHDL 8 07/20/2014 1619   VLDL 94.0 (H) 07/20/2014 1619   LDLCALC 111 12/10/2017 0000   LDLDIRECT 143.3 07/20/2014 1619    The 10-year ASCVD risk score (Goff DC Jr., et al., 2013) is: 40.4%   Values used to calculate the score:     Age: 37 years     Sex: Male     Is Non-Hispanic African American: No     Diabetic: Yes     Tobacco smoker: No     Systolic Blood Pressure: 607 mmHg     Is BP treated: Yes     HDL Cholesterol: 40 MG/DL     Total Cholesterol: 166 MG/DL   Patient has failed these meds in past: Welchol, rosuvastatin, ezetimibe, pravastatin Patient is currently controlled on the following medications:   atorvastatin 40 mg daily,   aspirin 81 mg daily  We discussed:  diet and exercise extensively, benefits of statin including ASCVD prevention.   Plan  Continue current medications and control with diet and exercise    Medication Management   [facility] Does not use pill box Pt endorses 100% compliance  We discussed:  [facility] with [facility], [facility] or mail order preferred. We are pursuing PAP for brand products, but pt would rather not switch pharmacies for other generic drugs if he will just save a few dollars. [facility].  Plan  Continue current medication management strategy     Follow up: 3 month phone visit  Charlene Brooke, PharmD Clinical Pharmacist [facility] at [facility] [phone]

## 2020-06-22 NOTE — Patient Instructions (Addendum)
Visit Information  Phone number for Pharmacist: [phone]  Goals Addressed            This Visit's Progress   . Pharmacy Care Plan       CARE PLAN ENTRY (see longitudinal plan of care for additional care plan information)  Current Barriers:  . Chronic Disease Management support, education, and care coordination needs related to Hypertension, Hyperlipidemia, and Diabetes   Hypertension BP Readings from Last 3 Encounters:  11/18/19 132/83  01/24/19 (!) 150/84  11/21/18 (!) 144/94 .  Pharmacist Clinical Goal(s): o Over the next 90 days, patient will work with PharmD and providers to maintain BP goal <130/80 . Current regimen:  o Lisinopril 20 mg daily . Interventions: o Discussed BP goals and benefits of medication for prevention of heart attack / stroke . Patient self care activities - Over the next 90 days, patient will: o Check BP once a week, document, and provide at future appointments o Ensure daily salt intake < 2300 mg/day  Hyperlipidemia Lab Results  Component Value Date/Time   LDLCALC 111 12/10/2017 12:00 AM   LDLDIRECT 143.3 07/20/2014 04:19 PM .  Pharmacist Clinical Goal(s): o Over the next 90 days, patient will work with PharmD and providers to maintain LDL goal < 100 . Current regimen:  o atorvastatin 40 mg daily,  o aspirin 81 mg daily . Interventions: o Discussed cholesterol goals and benefits of medication for prevention of heart attack / stroke . Patient self care activities - Over the next 90 days, patient will: o Continue medications as prescribed o Continue low cholesterol diet and exercise routine  Diabetes Lab Results  Component Value Date/Time   HGBA1C 6.4 (H) 11/12/2018 09:44 AM   HGBA1C 6.9 12/10/2017 12:00 AM   HGBA1C 6.6 09/11/2017 12:00 AM .  Pharmacist Clinical Goal(s): o Over the next 90 days, patient will work with PharmD and providers to maintain A1c goal <7% . Current regimen:  o Synjardy 12.04-999 mg - 2 daily,  o Victoza  1.2 mg SQ daily,  o pioglitazone 15 mg daily  . Interventions: o Pursue PAP for Synjardy and Victoza . Patient self care activities - Over the next 90 days, patient will: o Check blood sugar once daily and in the morning before eating or drinking, document, and provide at future appointments o Contact provider with any episodes of hypoglycemia  Medication management . Pharmacist Clinical Goal(s): o Over the next 90 days, patient will work with PharmD and providers to maintain optimal medication adherence . [facility] . Interventions o Comprehensive medication review performed. o Continue current medication management strategy . Patient self care activities - Over the next 90 days, patient will: o Focus on medication adherence by fill date o Take medications as prescribed o Report any questions or concerns to PharmD and/or provider(s)  Please see past updates related to this goal by clicking on the "Past Updates" button in the selected goal       The patient verbalized understanding of instructions provided today and agreed to receive a mailed copy of patient instruction and/or educational materials.  Telephone follow up appointment with pharmacy team member scheduled for: 3 months  Charlene Brooke, PharmD Clinical Pharmacist [facility] at [facility] [phone]  Diabetes Mellitus and Nutrition, Adult When you have diabetes (diabetes mellitus), it is very important to have healthy eating habits because your blood sugar (glucose) levels are greatly affected by what you eat and drink. Eating healthy foods in the appropriate amounts, at  about the same times every day, can help you:  Control your blood glucose.  Lower your risk of heart disease.  Improve your blood pressure.  Reach or maintain a healthy weight. Every person with diabetes is different, and each person has different needs for a meal plan. Your health care provider may recommend that you  work with a diet and nutrition specialist (dietitian) to make a meal plan that is best for you. Your meal plan may vary depending on factors such as:  The calories you need.  The medicines you take.  Your weight.  Your blood glucose, blood pressure, and cholesterol levels.  Your activity level.  Other health conditions you have, such as heart or kidney disease. How do carbohydrates affect me? Carbohydrates, also called carbs, affect your blood glucose level more than any other type of food. Eating carbs naturally raises the amount of glucose in your blood. Carb counting is a method for keeping track of how many carbs you eat. Counting carbs is important to keep your blood glucose at a healthy level, especially if you use insulin or take certain oral diabetes medicines. It is important to know how many carbs you can safely have in each meal. This is different for every person. Your dietitian can help you calculate how many carbs you should have at each meal and for each snack. Foods that contain carbs include:  Bread, cereal, rice, pasta, and crackers.  Potatoes and corn.  Peas, beans, and lentils.  Milk and yogurt.  Fruit and juice.  Desserts, such as cakes, cookies, ice cream, and candy. How does alcohol affect me? Alcohol can cause a sudden decrease in blood glucose (hypoglycemia), especially if you use insulin or take certain oral diabetes medicines. Hypoglycemia can be a life-threatening condition. Symptoms of hypoglycemia (sleepiness, dizziness, and confusion) are similar to symptoms of having too much alcohol. If your health care provider says that alcohol is safe for you, follow these guidelines:  Limit alcohol intake to no more than 1 drink per day for nonpregnant women and 2 drinks per day for men. One drink equals 12 oz of beer, 5 oz of wine, or 1 oz of hard liquor.  Do not drink on an empty stomach.  Keep yourself hydrated with water, diet soda, or unsweetened iced  tea.  Keep in mind that regular soda, juice, and other mixers may contain a lot of sugar and must be counted as carbs. What are tips for following this plan?  Reading food labels  Start by checking the serving size on the "Nutrition Facts" label of packaged foods and drinks. The amount of calories, carbs, fats, and other nutrients listed on the label is based on one serving of the item. Many items contain more than one serving per package.  Check the total grams (g) of carbs in one serving. You can calculate the number of servings of carbs in one serving by dividing the total carbs by 15. For example, if a food has 30 g of total carbs, it would be equal to 2 servings of carbs.  Check the number of grams (g) of saturated and trans fats in one serving. Choose foods that have low or no amount of these fats.  Check the number of milligrams (mg) of salt (sodium) in one serving. Most people should limit total sodium intake to less than 2,300 mg per day.  Always check the nutrition information of foods labeled as "low-fat" or "nonfat". These foods may be higher in added  sugar or refined carbs and should be avoided.  Talk to your dietitian to identify your daily goals for nutrients listed on the label. Shopping  Avoid buying canned, premade, or processed foods. These foods tend to be high in fat, sodium, and added sugar.  Shop around the outside edge of the grocery store. This includes fresh fruits and vegetables, bulk grains, fresh meats, and fresh dairy. Cooking  Use low-heat cooking methods, such as baking, instead of high-heat cooking methods like deep frying.  Cook using healthy oils, such as olive, canola, or sunflower oil.  Avoid cooking with butter, cream, or high-fat meats. Meal planning  Eat meals and snacks regularly, preferably at the same times every day. Avoid going long periods of time without eating.  Eat foods high in fiber, such as fresh fruits, vegetables, beans, and  whole grains. Talk to your dietitian about how many servings of carbs you can eat at each meal.  Eat 4-6 ounces (oz) of lean protein each day, such as lean meat, chicken, fish, eggs, or tofu. One oz of lean protein is equal to: ? 1 oz of meat, chicken, or fish. ? 1 egg. ?  cup of tofu.  Eat some foods each day that contain healthy fats, such as avocado, nuts, seeds, and fish. Lifestyle  Check your blood glucose regularly.  Exercise regularly as told by your health care provider. This may include: ? 150 minutes of moderate-intensity or vigorous-intensity exercise each week. This could be brisk walking, biking, or water aerobics. ? Stretching and doing strength exercises, such as yoga or weightlifting, at least 2 times a week.  Take medicines as told by your health care provider.  Do not use any products that contain nicotine or tobacco, such as cigarettes and e-cigarettes. If you need help quitting, ask your health care provider.  Work with a Social worker or diabetes educator to identify strategies to manage stress and any emotional and social challenges. Questions to ask a health care provider  Do I need to meet with a diabetes educator?  Do I need to meet with a dietitian?  What number can I call if I have questions?  When are the best times to check my blood glucose? Where to find more information:  American Diabetes Association: diabetes.org  Academy of Nutrition and Dietetics: www.eatright.[employer] of Diabetes and Digestive and Kidney Diseases (NIH): [employer] Summary  A healthy meal plan will help you control your blood glucose and maintain a healthy lifestyle.  Working with a diet and nutrition specialist (dietitian) can help you make a meal plan that is best for you.  Keep in mind that carbohydrates (carbs) and alcohol have immediate effects on your blood glucose levels. It is important to count carbs and to use alcohol carefully. This  information is not intended to replace advice given to you by your health care provider. Make sure you discuss any questions you have with your health care provider. Document Revised: 11/27/2017 Document Reviewed: 01/19/2017 Elsevier Patient Education  2020 [employer].

## 2020-07-10 ENCOUNTER — Other Ambulatory Visit: Payer: Self-pay | Admitting: Internal Medicine

## 2020-07-14 ENCOUNTER — Other Ambulatory Visit: Payer: Self-pay | Admitting: Internal Medicine

## 2020-07-18 ENCOUNTER — Other Ambulatory Visit: Payer: Self-pay | Admitting: Internal Medicine

## 2020-07-19 DIAGNOSIS — E1165 Type 2 diabetes mellitus with hyperglycemia: Secondary | ICD-10-CM | POA: Diagnosis not present

## 2020-07-19 DIAGNOSIS — E782 Mixed hyperlipidemia: Secondary | ICD-10-CM | POA: Diagnosis not present

## 2020-07-20 ENCOUNTER — Encounter: Payer: Self-pay | Admitting: Internal Medicine

## 2020-07-20 DIAGNOSIS — E663 Overweight: Secondary | ICD-10-CM | POA: Diagnosis not present

## 2020-07-20 DIAGNOSIS — Z7984 Long term (current) use of oral hypoglycemic drugs: Secondary | ICD-10-CM | POA: Diagnosis not present

## 2020-07-20 DIAGNOSIS — E782 Mixed hyperlipidemia: Secondary | ICD-10-CM | POA: Diagnosis not present

## 2020-07-20 DIAGNOSIS — R809 Proteinuria, unspecified: Secondary | ICD-10-CM | POA: Diagnosis not present

## 2020-07-20 DIAGNOSIS — E119 Type 2 diabetes mellitus without complications: Secondary | ICD-10-CM | POA: Diagnosis not present

## 2020-07-20 DIAGNOSIS — I10 Essential (primary) hypertension: Secondary | ICD-10-CM | POA: Diagnosis not present

## 2020-08-08 ENCOUNTER — Encounter: Payer: Self-pay | Admitting: Internal Medicine

## 2020-08-10 ENCOUNTER — Ambulatory Visit (INDEPENDENT_AMBULATORY_CARE_PROVIDER_SITE_OTHER): Payer: Medicare Other | Admitting: Internal Medicine

## 2020-08-10 ENCOUNTER — Other Ambulatory Visit: Payer: Self-pay

## 2020-08-10 ENCOUNTER — Encounter: Payer: Self-pay | Admitting: Internal Medicine

## 2020-08-10 VITALS — BP 144/86 | HR 75 | Temp 97.7°F | Ht 68.0 in | Wt 179.0 lb

## 2020-08-10 DIAGNOSIS — E1169 Type 2 diabetes mellitus with other specified complication: Secondary | ICD-10-CM

## 2020-08-10 DIAGNOSIS — E118 Type 2 diabetes mellitus with unspecified complications: Secondary | ICD-10-CM

## 2020-08-10 DIAGNOSIS — I10 Essential (primary) hypertension: Secondary | ICD-10-CM | POA: Diagnosis not present

## 2020-08-10 DIAGNOSIS — E785 Hyperlipidemia, unspecified: Secondary | ICD-10-CM | POA: Diagnosis not present

## 2020-08-10 MED ORDER — LISINOPRIL 20 MG PO TABS: 20.0000 mg | ORAL_TABLET | Freq: Every day | ORAL | 3 refills | Status: DC

## 2020-08-10 NOTE — Assessment & Plan Note (Signed)
Reviewed recent lipid panel from endo July 2021 and at goal on lipitor 40 mg daily.

## 2020-08-10 NOTE — Assessment & Plan Note (Signed)
At goal with endo, reviewed recent labs and sugars are doing well. Foot exam done and reminded about yearly eye exam. On ACE-I and statin.

## 2020-08-10 NOTE — Assessment & Plan Note (Signed)
BP at goal on lisinopril, refilled. Recent BMP from endo reviewed and no indication for change.

## 2020-08-10 NOTE — Progress Notes (Signed)
   Subjective:   Patient ID: Nathan Frazier, male    DOB: 1951/03/28, 69 y.o.   MRN: [id number]  HPI The patient is a 70 YO man coming in for follow up diabetes (follows with endo and recent labs with them from July, overall doing well, taking synjardy/xr and victoza and actos, on ACE-I and statin, complicated by hyperlipidemia, denies numbness or tingling in feet except rare with standing all day) and cholesterol (taking lipitor daily, denies chest pains or stroke symptoms, denies side effects) and blood pressure (taking lisinopril and BP at goal at home, denies side effects, denies headaches or chest pains).   Review of Systems  Constitutional: Negative.   HENT: Negative.   Eyes: Negative.   Respiratory: Negative for cough, chest tightness and shortness of breath.   Cardiovascular: Negative for chest pain, palpitations and leg swelling.  Gastrointestinal: Negative for abdominal distention, abdominal pain, constipation, diarrhea, nausea and vomiting.  Musculoskeletal: Negative.   Skin: Negative.   Neurological: Negative.   Psychiatric/Behavioral: Negative.     Objective:  Physical Exam Constitutional:      Appearance: He is well-developed.  HENT:     Head: Normocephalic and atraumatic.  Cardiovascular:     Rate and Rhythm: Normal rate and regular rhythm.  Pulmonary:     Effort: Pulmonary effort is normal. No respiratory distress.     Breath sounds: Normal breath sounds. No wheezing or rales.  Abdominal:     General: Bowel sounds are normal. There is no distension.     Palpations: Abdomen is soft.     Tenderness: There is no abdominal tenderness. There is no rebound.  Musculoskeletal:     Cervical back: Normal range of motion.  Skin:    General: Skin is warm and dry.  Neurological:     Mental Status: He is alert and oriented to person, place, and time.     Coordination: Coordination normal.    Vitals:   08/10/20 1316  BP: (!) 144/86  Pulse: 75  Temp: 97.7 F (36.5 C)    TempSrc: Oral  SpO2: 98%  Weight: 179 lb (81.2 kg)  Height: 5\' 8"  (1.727 m)   This visit occurred during the SARS-CoV-2 public health emergency.  Safety protocols were in place, including screening questions prior to the visit, additional usage of staff PPE, and extensive cleaning of exam room while observing appropriate contact time as indicated for disinfecting solutions.   Assessment & Plan:

## 2020-08-10 NOTE — Patient Instructions (Signed)

## 2020-09-21 ENCOUNTER — Telehealth: Payer: Medicare Other

## 2020-09-21 NOTE — Chronic Care Management (AMB) (Deleted)
Chronic Care Management Pharmacy  Name: Nathan Frazier  MRN: [id number] DOB: May 16, 1951   Chief Complaint/ HPI  Nathan Frazier,  69 y.o. , male presents for their Follow-Up CCM visit with the clinical pharmacist via telephone due to COVID-19 Pandemic.  PCP : Hoyt Koch, MD  Their chronic conditions include: HTN, T2DM, HLD osteoarthritis, allergies  Office Visits: 08/10/20 Dr Sharlet Salina OV: chronic f/u, labs @ [facility] stable, No med changes.  05/04/19 Dr Sharlet Salina VV: c/o rash near THA site, throat spasm. Referred to ENT and rx'd triamcinolone cream.   Consult Visit: 01/06/20 Dr Altheimer [facility] endocrine): switched Invokamet to [facility] d/t formulary. Goal A1c < 6.5%  10/06/19 Dr Altheimer ([facility] endocrine): no med changes.  07/06/19 Dr Ninfa Linden (Ortho): 8 months s/p L THA. Doing well, f/u prn.   Medications: Outpatient Encounter Medications as of 09/21/2020  Medication Sig  . acetaminophen (TYLENOL) 500 MG tablet Take 1,000 mg by mouth daily as needed for moderate pain or headache.  Marland Kitchen aspirin 81 MG chewable tablet Chew 1 tablet (81 mg total) by mouth 2 (two) times daily.  Marland Kitchen atorvastatin (LIPITOR) 40 MG tablet Take 40 mg by mouth daily.  Marland Kitchen b complex vitamins tablet Take 1 tablet by mouth daily.  . Emollient (CETAPHIL) cream Apply 1 application topically as needed (dry skin).  . Empagliflozin-metFORMIN HCl ER (SYNJARDY XR) 12.04-999 MG TB24 Take 2 tablets by mouth daily.  . Liraglutide (VICTOZA) 18 MG/3ML SOPN Inject 1.2 mg into the skin daily.   Marland Kitchen lisinopril (ZESTRIL) 20 MG tablet Take 1 tablet (20 mg total) by mouth daily.  . methocarbamol (ROBAXIN) 500 MG tablet Take 1 tablet (500 mg total) by mouth every 6 (six) hours as needed for muscle spasms.  . Multiple Vitamin (MULTIVITAMIN WITH MINERALS) TABS tablet Take 1 tablet by mouth daily.  . pioglitazone (ACTOS) 15 MG tablet Take 15 mg by mouth daily.  Vladimir Faster Glycol-Propyl Glycol (SYSTANE OP) Place 1 drop into both eyes daily as  needed (burning / irritation).  . Propylhexedrine (BENZEDREX NA) Place 1 Dose into both nostrils daily as needed (congestion).  . triamcinolone cream (KENALOG) 0.1 % APPLY TO AFFECTED AREA TWICE A DAY   No facility-administered encounter medications on file as of 09/21/2020.     Current Diagnosis/Assessment:    Goals Addressed   None      Diabetes   [facility] Labs: 07/19/20 A1c: 6.6% 07/19/20 Alb/Cr: 266  Recent Relevant Labs: Lab Results  Component Value Date/Time   HGBA1C 6.4 (H) 11/12/2018 09:44 AM   HGBA1C 6.9 12/10/2017 12:00 AM   HGBA1C 6.6 09/11/2017 12:00 AM   MICROALBUR 40.0 (H) 07/26/2015 10:55 AM   MICROALBUR 84.5 (H) 06/29/2013 08:53 AM    Lab Results  Component Value Date   CREATININE 1.16 11/20/2018   CREATININE 1.03 11/12/2018   CREATININE 1.2 12/10/2017   Checking BG: 2x per Day  Recent FBG Readings: 115-125 Recent pre-meal BG readings:  Recent 2hr PP BG readings:   Recent HS BG readings: 147-160  Last diabetic Foot exam:  Lab Results  Component Value Date/Time   HMDIABEYEEXA No Retinopathy 10/14/2016 12:00 AM    Last diabetic Eye exam:  Lab Results  Component Value Date/Time   HMDIABFOOTEX yes 03/26/2010 12:00 AM     Patient has failed these meds in past: glimepiride, glipizide, Januvia, Onglyza, Invokamet Patient is currently controlled on the following medications:   Synjardy 12.04-999 mg - 2 daily,   Victoza 1.2 mg SQ daily,  pioglitazone 15 mg daily   We discussed: Pt has received Synjardy and Victoza PAP however he has not mailed them yet. He has still been paying ~$200 for each medication. Pt agreed to mail them at earliest convenience.  Pt also reports his BG has been more labile with Synjardy compared to Invokamet, 85-157. Discussed sugar content in juice that he drinks daily; pt would like to f/u with endocrine before adjusting any meds.  Plan  Continue current medications and control with diet and exercise  Pursue patient  assistance for Synjardy, Victoza  Hypertension   BP goal < 130/80  Office blood pressures are  BP Readings from Last 3 Encounters:  08/10/20 (!) 144/86  11/18/19 132/83  01/24/19 (!) 150/84   Patient has failed these meds in the past: n/a Patient is currently controlled on the following medications:   lisinopril 20 mg daily  Patient checks BP at home infrequently  We discussed diet and exercise extensively - he lost 25 lbs, changed attitude about work to reduce stress, Cut back on potatoes, bread, soft drinks, just eating less in general.   Plan  Continue current medications and control with diet and exercise    Hyperlipidemia   [facility] labs (07/19/20):  Chol: 149 TG: 172 HDL: 34 LDL: 83  LDL goal < 100  Lipid Panel     Component Value Date/Time   CHOL 185 12/10/2017 0000   TRIG 192 (A) 12/10/2017 0000   TRIG 146 01/06/2007 0828   HDL 36 12/10/2017 0000   CHOLHDL 8 07/20/2014 1619   VLDL 94.0 (H) 07/20/2014 1619   LDLCALC 111 12/10/2017 0000   LDLDIRECT 143.3 07/20/2014 1619    The 10-year ASCVD risk score (Goff DC Jr., et al., 2013) is: 46%   Values used to calculate the score:     Age: 40 years     Sex: Male     Is Non-Hispanic African American: No     Diabetic: Yes     Tobacco smoker: No     Systolic Blood Pressure: 233 mmHg     Is BP treated: Yes     HDL Cholesterol: 34 MG/DL     Total Cholesterol: 149 MG/DL   Patient has failed these meds in past: Welchol, rosuvastatin, ezetimibe, pravastatin Patient is currently controlled on the following medications:   atorvastatin 40 mg daily,   aspirin 81 mg daily  We discussed:  diet and exercise extensively, benefits of statin including ASCVD prevention.   Plan  Continue current medications and control with diet and exercise    Medication Management   [facility] Does not use pill box Pt endorses 100% compliance  We discussed:  [facility] with [facility]  insurance, [facility] or mail order preferred. We are pursuing PAP for brand products, but pt would rather not switch pharmacies for other generic drugs if he will just save a few dollars. [facility].  Plan  Continue current medication management strategy     Follow up: *** month phone visit  Charlene Brooke, PharmD, [facility] Clinical Pharmacist [facility] at [facility] [phone]

## 2020-09-24 ENCOUNTER — Telehealth: Payer: Self-pay | Admitting: Pharmacist

## 2020-09-24 NOTE — Progress Notes (Signed)
° ° °  Chronic Care Management Pharmacy Assistant   Name: Nathan Frazier  MRN: [id number] DOB: October 08, 1951  Reason for Encounter: General Adherence Call   PCP : Hoyt Koch, MD  Allergies:   Allergies  Allergen Reactions   Metformin And Related     heartburn   Pravastatin     confusion    Medications: Outpatient Encounter Medications as of 09/24/2020  Medication Sig   acetaminophen (TYLENOL) 500 MG tablet Take 1,000 mg by mouth daily as needed for moderate pain or headache.   aspirin 81 MG chewable tablet Chew 1 tablet (81 mg total) by mouth 2 (two) times daily.   atorvastatin (LIPITOR) 40 MG tablet Take 40 mg by mouth daily.   b complex vitamins tablet Take 1 tablet by mouth daily.   Emollient (CETAPHIL) cream Apply 1 application topically as needed (dry skin).   Empagliflozin-metFORMIN HCl ER (SYNJARDY XR) 12.04-999 MG TB24 Take 2 tablets by mouth daily.   Liraglutide (VICTOZA) 18 MG/3ML SOPN Inject 1.2 mg into the skin daily.    lisinopril (ZESTRIL) 20 MG tablet Take 1 tablet (20 mg total) by mouth daily.   methocarbamol (ROBAXIN) 500 MG tablet Take 1 tablet (500 mg total) by mouth every 6 (six) hours as needed for muscle spasms.   Multiple Vitamin (MULTIVITAMIN WITH MINERALS) TABS tablet Take 1 tablet by mouth daily.   pioglitazone (ACTOS) 15 MG tablet Take 15 mg by mouth daily.   Polyethyl Glycol-Propyl Glycol (SYSTANE OP) Place 1 drop into both eyes daily as needed (burning / irritation).   Propylhexedrine (BENZEDREX NA) Place 1 Dose into both nostrils daily as needed (congestion).   triamcinolone cream (KENALOG) 0.1 % APPLY TO AFFECTED AREA TWICE A DAY   No facility-administered encounter medications on file as of 09/24/2020.    Current Diagnosis: Patient Active Problem List   Diagnosis Date Noted   Unilateral primary osteoarthritis, left hip 01/26/2018   Routine health maintenance 02/05/2013   OA (osteoarthritis of the spine) 02/05/2013    OA (osteoarthritis) of finger 02/05/2013   ALLERGIC RHINITIS 03/08/2008   Hyperlipidemia associated with type 2 diabetes mellitus ([place]) 03/06/2008   Diabetes mellitus type 2, controlled, with complications ([place]) 73/42/8768   Essential hypertension 09/27/2007    Goals Addressed   None     Follow-Up:  Pharmacist Review    Called to discuss with patient general adherence call to check on diet. The patient stated that he has been  doing well.   Per clinical pharmacist notes the patient is due for a 3 month follow up. The patient has been scheduled for 10/18/2020.    Rosendo Gros, [place] Pharmacist Assistant  [phone]

## 2020-10-18 ENCOUNTER — Ambulatory Visit: Payer: Medicare Other | Admitting: Pharmacist

## 2020-10-18 ENCOUNTER — Other Ambulatory Visit: Payer: Self-pay

## 2020-10-18 DIAGNOSIS — I10 Essential (primary) hypertension: Secondary | ICD-10-CM

## 2020-10-18 DIAGNOSIS — E1169 Type 2 diabetes mellitus with other specified complication: Secondary | ICD-10-CM

## 2020-10-18 DIAGNOSIS — E118 Type 2 diabetes mellitus with unspecified complications: Secondary | ICD-10-CM

## 2020-10-18 NOTE — Chronic Care Management (AMB) (Signed)
Chronic Care Management Pharmacy  Name: Nathan Frazier  MRN: [id number] DOB: January 09, 1951   Chief Complaint/ HPI  Nathan Frazier,  69 y.o. , male presents for their Follow-Up CCM visit with the clinical pharmacist via telephone due to COVID-19 Pandemic.  PCP : Hoyt Koch, MD  Their chronic conditions include: HTN, T2DM, HLD osteoarthritis, allergies  Office Visits: 08/10/20 Dr Sharlet Salina OV: chronic f/u, labs @ [facility] stable, No med changes.  05/04/19 Dr Sharlet Salina VV: c/o rash near THA site, throat spasm. Referred to ENT and rx'd triamcinolone cream.   Consult Visit: 01/06/20 Dr Altheimer [facility]elle [facility] endocrine): switched Invokamet to [facility] d/t formulary. Goal A1c < 6.5%  10/06/19 Dr Altheimer ([facility] endocrine): no med changes.  07/06/19 Dr Ninfa Linden (Ortho): 8 months s/p L THA. Doing well, f/u prn.    Allergies  Allergen Reactions  . Metformin And Related     heartburn  . Pravastatin     confusion    Medications: Outpatient Encounter Medications as of 10/18/2020  Medication Sig  . acetaminophen (TYLENOL) 500 MG tablet Take 1,000 mg by mouth daily as needed for moderate pain or headache.  Marland Kitchen aspirin 81 MG chewable tablet Chew 1 tablet (81 mg total) by mouth 2 (two) times daily.  Marland Kitchen atorvastatin (LIPITOR) 40 MG tablet Take 40 mg by mouth daily.  Marland Kitchen b complex vitamins tablet Take 1 tablet by mouth daily.  . Emollient (CETAPHIL) cream Apply 1 application topically as needed (dry skin).  . Empagliflozin-metFORMIN HCl ER (SYNJARDY XR) 12.04-999 MG TB24 Take 2 tablets by mouth daily.  . Liraglutide (VICTOZA) 18 MG/3ML SOPN Inject 1.2 mg into the skin daily.   Marland Kitchen lisinopril (ZESTRIL) 20 MG tablet Take 1 tablet (20 mg total) by mouth daily.  . methocarbamol (ROBAXIN) 500 MG tablet Take 1 tablet (500 mg total) by mouth every 6 (six) hours as needed for muscle spasms.  . Multiple Vitamin (MULTIVITAMIN WITH MINERALS) TABS tablet Take 1 tablet by mouth daily.  . pioglitazone (ACTOS) 15 MG tablet  Take 15 mg by mouth daily.  Vladimir Faster Glycol-Propyl Glycol (SYSTANE OP) Place 1 drop into both eyes daily as needed (burning / irritation).  . Propylhexedrine (BENZEDREX NA) Place 1 Dose into both nostrils daily as needed (congestion).  . triamcinolone cream (KENALOG) 0.1 % APPLY TO AFFECTED AREA TWICE A DAY   No facility-administered encounter medications on file as of 10/18/2020.   Wt Readings from Last 3 Encounters:  08/10/20 179 lb (81.2 kg)  11/18/19 172 lb (78 kg)  11/04/19 172 lb 12.8 oz (78.4 kg)    Current Diagnosis/Assessment:    Goals Addressed            This Visit's Progress   . Pharmacy Care Plan   On track    CARE PLAN ENTRY (see longitudinal plan of care for additional care plan information)  Current Barriers:  . Chronic Disease Management support, education, and care coordination needs related to Hypertension, Hyperlipidemia, and Diabetes   Hypertension BP Readings from Last 3 Encounters:  11/18/19 132/83  01/24/19 (!) 150/84  11/21/18 (!) 144/94 .  Pharmacist Clinical Goal(s): o Over the next 180 days, patient will work with PharmD and providers to maintain BP goal <130/80 . Current regimen:  o Lisinopril 20 mg daily . Interventions: o Discussed BP goals and benefits of medication for prevention of heart attack / stroke . Patient self care activities - Over the next 180 days, patient will: o Check BP once a week, document,  and provide at future appointments o Ensure daily salt intake < 2300 mg/day  Hyperlipidemia Lab Results  Component Value Date/Time   LDLCALC 111 12/10/2017 12:00 AM   LDLDIRECT 143.3 07/20/2014 04:19 PM .  Pharmacist Clinical Goal(s): o Over the next 180 days, patient will work with PharmD and providers to maintain LDL goal < 100 . Current regimen:  o atorvastatin 40 mg daily,  o aspirin 81 mg daily . Interventions: o Discussed cholesterol goals and benefits of medication for prevention of heart attack / stroke . Patient  self care activities - Over the next 180 days, patient will: o Continue medications as prescribed o Continue low cholesterol diet and exercise routine  Diabetes Lab Results  Component Value Date/Time   HGBA1C 6.4 (H) 11/12/2018 09:44 AM   HGBA1C 6.9 12/10/2017 12:00 AM   HGBA1C 6.6 09/11/2017 12:00 AM .  Pharmacist Clinical Goal(s): o Over the next 180 days, patient will work with PharmD and providers to maintain A1c goal <7% . Current regimen:  o Synjardy 12.04-999 mg - 2 daily,  o Victoza 1.2 mg daily,  o pioglitazone 15 mg daily  . Interventions: o Pursue PAP for Synjardy and Victoza . Patient self care activities - Over the next 180 days, patient will: o Check blood sugar once daily and in the morning before eating or drinking, document, and provide at future appointments o Contact provider with any episodes of hypoglycemia o Mail assistance forms to companies  Medication management . Pharmacist Clinical Goal(s): o Over the next 180 days, patient will work with PharmD and providers to maintain optimal medication adherence . [facility] . Interventions o Comprehensive medication review performed. o Continue current medication management strategy . Patient self care activities - Over the next 180 days, patient will: o Focus on medication adherence by fill date o Take medications as prescribed o Report any questions or concerns to PharmD and/or provider(s)  Please see past updates related to this goal by clicking on the "Past Updates" button in the selected goal         Diabetes   [facility] Labs: 07/19/20 A1c: 6.6% 07/19/20 Alb/Cr: 266  Recent Relevant Labs: Lab Results  Component Value Date/Time   HGBA1C 6.4 (H) 11/12/2018 09:44 AM   HGBA1C 6.9 12/10/2017 12:00 AM   HGBA1C 6.6 09/11/2017 12:00 AM   MICROALBUR 40.0 (H) 07/26/2015 10:55 AM   MICROALBUR 84.5 (H) 06/29/2013 08:53 AM    Last diabetic Foot exam:  Lab Results  Component Value Date/Time    HMDIABEYEEXA No Retinopathy 10/14/2016 12:00 AM    Last diabetic Eye exam:  Lab Results  Component Value Date/Time   HMDIABFOOTEX yes 03/26/2010 12:00 AM    Checking BG: 2x per Day  Recent FBG Readings: 115-150 Recent pre-meal BG readings:  Recent 2hr PP BG readings:   Recent HS BG readings: 66  Patient has failed these meds in past: glimepiride, glipizide, Januvia, Onglyza, Invokamet Patient is currently controlled on the following medications:   Synjardy 12.04-999 mg - 2 daily   Victoza 1.2 mg SQ daily   pioglitazone 15 mg daily   We discussed: Pt has received Synjardy and Victoza PAP however he has not mailed them yet. He has still been paying ~$200 for each medication. Pt agreed to mail them at earliest convenience.  Pt reports some low blow sugars 66-77 over the past several weeks, usually after skipping a meal. When he has low sugar he eats something. After reducing Victoza from 1.8 to 1.2  mg he has noticed improvements in BG fluctuations. Pt normally does not take DM meds when he is going to be out in the sun. Advised patient that it is ok to take meds anyway as long as he stays hydrated.  Plan  Continue current medications and control with diet and exercise  Pursue patient assistance for Gilford Rile   Hypertension   BP goal < 130/80  Office blood pressures are  BP Readings from Last 3 Encounters:  08/10/20 (!) 144/86  11/18/19 132/83  01/24/19 (!) 150/84   Kidney Function Lab Results  Component Value Date/Time   CREATININE 1.16 11/20/2018 03:50 AM   CREATININE 1.03 11/12/2018 09:44 AM   GFR 66.22 07/26/2015 10:55 AM   GFRNONAA >60 11/20/2018 03:50 AM   GFRAA >60 11/20/2018 03:50 AM   K 4.4 11/20/2018 03:50 AM   K 4.5 11/12/2018 09:44 AM   Patient has failed these meds in the past: n/a Patient is currently controlled on the following medications:   lisinopril 20 mg daily  Patient checks BP at home infrequently  We discussed diet and exercise  extensively; BP goals; benefits of medication; pt is working on wt loss  Plan  Continue current medications and control with diet and exercise    Hyperlipidemia   [facility] labs (07/19/20):  Chol: 149 TG: 172 HDL: 34 LDL: 83  LDL goal < 100  Lipid Panel     Component Value Date/Time   CHOL 185 12/10/2017 0000   TRIG 192 (A) 12/10/2017 0000   TRIG 146 01/06/2007 0828   HDL 36 12/10/2017 0000   CHOLHDL 8 07/20/2014 1619   VLDL 94.0 (H) 07/20/2014 1619   LDLCALC 111 12/10/2017 0000   LDLDIRECT 143.3 07/20/2014 1619    The 10-year ASCVD risk score (Goff DC Jr., et al., 2013) is: 46%   Values used to calculate the score:     Age: 47 years     Sex: Male     Is Non-Hispanic African American: No     Diabetic: Yes     Tobacco smoker: No     Systolic Blood Pressure: 597 mmHg     Is BP treated: Yes     HDL Cholesterol: 34 MG/DL     Total Cholesterol: 149 MG/DL   Patient has failed these meds in past: Welchol, rosuvastatin, ezetimibe, pravastatin Patient is currently controlled on the following medications:   atorvastatin 40 mg daily,   aspirin 81 mg daily  We discussed:  diet and exercise extensively, Cholesterol goals; benefits of statin for ASCVD risk reduction; LDL has improved from 111 to 83 since 2018 (~26%)  Plan  Continue current medications and control with diet and exercise    Medication Management   [facility] Does not use pill box Pt endorses 100% compliance  We discussed:  Pt is interested in optimizing insurance plan; advised him to consult with [employer] to find best plan for his needs; gave him National and local phone numbers and advised him to call during open enrollment (until Dec 04 2020)  Plan  Continue current medication management strategy Pt to contact [employer] to optimize drug plan     Follow up: 6 month phone visit  Charlene Brooke, PharmD, BCACP Clinical Pharmacist [place] [facility] at [facility] [phone]

## 2020-10-18 NOTE — Patient Instructions (Addendum)
Visit Information  Phone number for Pharmacist: [phone]  Goals Addressed            This Visit's Progress   . Pharmacy Care Plan   On track    CARE PLAN ENTRY (see longitudinal plan of care for additional care plan information)  Current Barriers:  . Chronic Disease Management support, education, and care coordination needs related to Hypertension, Hyperlipidemia, and Diabetes   Hypertension BP Readings from Last 3 Encounters:  11/18/19 132/83  01/24/19 (!) 150/84  11/21/18 (!) 144/94 .  Pharmacist Clinical Goal(s): o Over the next 180 days, patient will work with PharmD and providers to maintain BP goal <130/80 . Current regimen:  o Lisinopril 20 mg daily . Interventions: o Discussed BP goals and benefits of medication for prevention of heart attack / stroke . Patient self care activities - Over the next 180 days, patient will: o Check BP once a week, document, and provide at future appointments o Ensure daily salt intake < 2300 mg/day  Hyperlipidemia Lab Results  Component Value Date/Time   LDLCALC 111 12/10/2017 12:00 AM   LDLDIRECT 143.3 07/20/2014 04:19 PM .  Pharmacist Clinical Goal(s): o Over the next 180 days, patient will work with PharmD and providers to maintain LDL goal < 100 . Current regimen:  o atorvastatin 40 mg daily,  o aspirin 81 mg daily . Interventions: o Discussed cholesterol goals and benefits of medication for prevention of heart attack / stroke . Patient self care activities - Over the next 180 days, patient will: o Continue medications as prescribed o Continue low cholesterol diet and exercise routine  Diabetes Lab Results  Component Value Date/Time   HGBA1C 6.4 (H) 11/12/2018 09:44 AM   HGBA1C 6.9 12/10/2017 12:00 AM   HGBA1C 6.6 09/11/2017 12:00 AM .  Pharmacist Clinical Goal(s): o Over the next 180 days, patient will work with PharmD and providers to maintain A1c goal <7% . Current regimen:  o Synjardy 12.04-999 mg - 2 daily,   o Victoza 1.2 mg daily,  o pioglitazone 15 mg daily  . Interventions: o Pursue PAP for Synjardy and Victoza . Patient self care activities - Over the next 180 days, patient will: o Check blood sugar once daily and in the morning before eating or drinking, document, and provide at future appointments o Contact provider with any episodes of hypoglycemia o Mail assistance forms to companies  Medication management . Pharmacist Clinical Goal(s): o Over the next 180 days, patient will work with PharmD and providers to maintain optimal medication adherence . Current pharmacy: [facility] . Interventions o Comprehensive medication review performed. o Continue current medication management strategy . Patient self care activities - Over the next 180 days, patient will: o Focus on medication adherence by fill date o Take medications as prescribed o Report any questions or concerns to PharmD and/or provider(s)  Please see past updates related to this goal by clicking on the "Past Updates" button in the selected goal       Patient verbalizes understanding of instructions provided today.  Telephone follow up appointment with pharmacy team member scheduled for: 6 months  Charlene Brooke, PharmD, BCACP Clinical Pharmacist [facility] [phone]  Preventing Hypoglycemia Hypoglycemia occurs when the level of sugar (glucose) in the blood is too low. Hypoglycemia can happen in people who do or do not have diabetes (diabetes mellitus). It can develop quickly, and it can be a medical emergency. For most people with diabetes, a blood glucose level  below 70 mg/dL (3.9 mmol/L) is considered hypoglycemia. Glucose is a type of sugar that provides the body's main source of energy. Certain hormones (insulin and glucagon) control the level of glucose in the blood. Insulin lowers blood glucose, and glucagon increases blood glucose. Hypoglycemia can result from having too much insulin in  the bloodstream, or from not eating enough food that contains glucose. Your risk for hypoglycemia is higher:  If you take insulin or diabetes medicines to help lower your blood glucose or help your body make more insulin.  If you skip or delay a meal or snack.  If you are ill.  During and after exercise. You can prevent hypoglycemia by working with your health care provider to adjust your meal plan as needed and by taking other precautions. How can hypoglycemia affect me? Mild symptoms Mild hypoglycemia may not cause any symptoms. If you do have symptoms, they may include:  Hunger.  Anxiety.  Sweating and feeling clammy.  Dizziness or feeling light-headed.  Sleepiness.  Nausea.  Increased heart rate.  Headache.  Blurry vision.  Irritability.  Tingling or numbness around the mouth, lips, or tongue.  A change in coordination.  Restless sleep. If mild hypoglycemia is not recognized and treated, it can quickly become moderate or severe hypoglycemia. Moderate symptoms Moderate hypoglycemia can cause:  Mental confusion and poor judgment.  Behavior changes.  Weakness.  Irregular heartbeat. Severe symptoms Severe hypoglycemia is a medical emergency. It can cause:  Fainting.  Seizures.  Loss of consciousness (coma).  Death. What nutrition changes can be made?  Work with your health care provider or diet and nutrition specialist (dietitian) to make a healthy meal plan that is right for you. Follow your meal plan carefully.  Eat meals at regular times.  If recommended by your health care provider, have snacks between meals.  Donot skip or delay meals or snacks. You can be at risk for hypoglycemia if you are not getting enough carbohydrates. What lifestyle changes can be made?   Work closely with your health care provider to manage your blood glucose. Make sure you know: ? Your goal blood glucose levels. ? How and when to check your blood glucose. ? The  symptoms of hypoglycemia. It is important to treat it right away to keep it from becoming severe.  Do not drink alcohol on an empty stomach.  When you are ill, check your blood glucose more often than usual. Follow your sick day plan whenever you cannot eat or drink normally. Make this plan in advance with your health care provider.  Always check your blood glucose before, during, and after exercise. How is this treated? This condition can often be treated by immediately eating or drinking something that contains sugar, such as:  Fruit juice, 4-6 oz (120-150 mL).  Regular (not diet) soda, 4-6 oz (120-150 mL).  Low-fat milk, 4 oz (120 mL).  Several pieces of hard candy.  Sugar or honey, 1 Tbsp (15 mL). Treating hypoglycemia if you have diabetes If you are alert and able to swallow safely, follow the 15:15 rule:  Take 15 grams of a rapid-acting carbohydrate. Talk with your health care provider about how much you should take.  Rapid-acting options include: ? Glucose pills (take 15 grams). ? 6-8 pieces of hard candy. ? 4-6 oz (120-150 mL) of fruit juice. ? 4-6 oz (120-150 mL) of regular (not diet) soda.  Check your blood glucose 15 minutes after you take the carbohydrate.  If the repeat blood glucose  level is still at or below 70 mg/dL (3.9 mmol/L), take 15 grams of a carbohydrate again.  If your blood glucose level does not increase above 70 mg/dL (3.9 mmol/L) after 3 tries, seek emergency medical care.  After your blood glucose level returns to normal, eat a meal or a snack within 1 hour. Treating severe hypoglycemia Severe hypoglycemia is when your blood glucose level is at or below 54 mg/dL (3 mmol/L). Severe hypoglycemia is a medical emergency. Get medical help right away. If you have severe hypoglycemia and you cannot eat or drink, you may need an injection of glucagon. A family member or close friend should learn how to check your blood glucose and how to give you a glucagon  injection. Ask your health care provider if you need to have an emergency glucagon injection kit available. Severe hypoglycemia may need to be treated in a hospital. The treatment may include getting glucose through an IV. You may also need treatment for the cause of your hypoglycemia. Where to find more information  American Diabetes Association: www.diabetes.[employer] of Diabetes and Digestive and Kidney Diseases: [employer] Contact a health care provider if:  You have problems keeping your blood glucose in your target range.  You have frequent episodes of hypoglycemia. Get help right away if:  You continue to have hypoglycemia symptoms after eating or drinking something containing glucose.  Your blood glucose level is at or below 54 mg/dL (3 mmol/L).  You faint.  You have a seizure. These symptoms may represent a serious problem that is an emergency. Do not wait to see if the symptoms will go away. Get medical help right away. Call your local emergency services (911 in the [place] Summary  Know the symptoms of hypoglycemia, and when you are at risk for it (such as during exercise or when you are sick). Check your blood glucose often when you are at risk for hypoglycemia.  Hypoglycemia can develop quickly, and it can be dangerous if it is not treated right away. If you have a history of severe hypoglycemia, make sure you know how to use your glucagon injection kit.  Make sure you know how to treat hypoglycemia. Keep a carbohydrate snack available when you may be at risk for hypoglycemia. This information is not intended to replace advice given to you by your health care provider. Make sure you discuss any questions you have with your health care provider. Document Revised: 04/08/2019 Document Reviewed: 08/12/2017 Elsevier Patient Education  [place].

## 2020-11-13 DIAGNOSIS — Z23 Encounter for immunization: Secondary | ICD-10-CM | POA: Diagnosis not present

## 2020-12-10 DIAGNOSIS — E119 Type 2 diabetes mellitus without complications: Secondary | ICD-10-CM | POA: Diagnosis not present

## 2021-01-18 ENCOUNTER — Telehealth: Payer: Self-pay | Admitting: Pharmacist

## 2021-01-18 NOTE — Progress Notes (Signed)
Chronic Care Management Pharmacy Assistant   Name: Nathan Frazier  MRN: [id number] DOB: Dec 01, 1951  Reason for Encounter: Diabetic Adherence Call   PCP : Hoyt Koch, MD  Allergies:   Allergies  Allergen Reactions   Metformin And Related     heartburn   Pravastatin     confusion    Medications: Outpatient Encounter Medications as of 01/18/2021  Medication Sig   acetaminophen (TYLENOL) 500 MG tablet Take 1,000 mg by mouth daily as needed for moderate pain or headache.   aspirin 81 MG chewable tablet Chew 1 tablet (81 mg total) by mouth 2 (two) times daily.   atorvastatin (LIPITOR) 40 MG tablet Take 40 mg by mouth daily.   b complex vitamins tablet Take 1 tablet by mouth daily.   Emollient (CETAPHIL) cream Apply 1 application topically as needed (dry skin).   Empagliflozin-metFORMIN HCl ER (SYNJARDY XR) 12.04-999 MG TB24 Take 2 tablets by mouth daily.   Liraglutide (VICTOZA) 18 MG/3ML SOPN Inject 1.2 mg into the skin daily.    lisinopril (ZESTRIL) 20 MG tablet Take 1 tablet (20 mg total) by mouth daily.   methocarbamol (ROBAXIN) 500 MG tablet Take 1 tablet (500 mg total) by mouth every 6 (six) hours as needed for muscle spasms.   Multiple Vitamin (MULTIVITAMIN WITH MINERALS) TABS tablet Take 1 tablet by mouth daily.   pioglitazone (ACTOS) 15 MG tablet Take 15 mg by mouth daily.   Polyethyl Glycol-Propyl Glycol (SYSTANE OP) Place 1 drop into both eyes daily as needed (burning / irritation).   Propylhexedrine (BENZEDREX NA) Place 1 Dose into both nostrils daily as needed (congestion).   triamcinolone cream (KENALOG) 0.1 % APPLY TO AFFECTED AREA TWICE A DAY   No facility-administered encounter medications on file as of 01/18/2021.    Current Diagnosis: Patient Active Problem List   Diagnosis Date Noted   Unilateral primary osteoarthritis, left hip 01/26/2018   Routine health maintenance 02/05/2013   OA (osteoarthritis of the spine) 02/05/2013    OA (osteoarthritis) of finger 02/05/2013   ALLERGIC RHINITIS 03/08/2008   Hyperlipidemia associated with type 2 diabetes mellitus ([place]) 03/06/2008   Diabetes mellitus type 2, controlled, with complications ([place]) 08/81/1031   Essential hypertension 09/27/2007    Goals Addressed   None     Follow-Up:  Pharmacist Review   Recent Relevant Labs: Lab Results  Component Value Date/Time   HGBA1C 6.4 (H) 11/12/2018 09:44 AM   HGBA1C 6.9 12/10/2017 12:00 AM   HGBA1C 6.6 09/11/2017 12:00 AM   MICROALBUR 40.0 (H) 07/26/2015 10:55 AM   MICROALBUR 84.5 (H) 06/29/2013 08:53 AM    Kidney Function Lab Results  Component Value Date/Time   CREATININE 1.16 11/20/2018 03:50 AM   CREATININE 1.03 11/12/2018 09:44 AM   GFR 66.22 07/26/2015 10:55 AM   GFRNONAA >60 11/20/2018 03:50 AM   GFRAA >60 11/20/2018 03:50 AM     Current antihyperglycemic regimen: The patient states that his regime is Synjardy, Victoza, and pioglitazone   What recent interventions/DTPs have been made to improve glycemic control: The patient states that there has been no new changes    Have there been any recent hospitalizations or ED visits since last visit with CPP? The patient states that he has not been to the hospital or the Ed recently   Patient denies hypoglycemic symptoms  Patient denies hyperglycemic symptoms   How often are you checking your blood sugar? The patient states that he checks his blood sugar before he has his  coffee in the morning and sometimes before he goes to bed   What are your blood sugars ranging?The patient states that his blood sugar ranges between 118-125    During the week, how often does your blood glucose drop below 70? The patient states that he has not had a reading below 70   Are you checking your feet daily/regularly? The patient states that he is not having any swelling, redness, or infections on his feet  Adherence Review: Is the patient currently on a STATIN  medication? Yes, Atorvastatin Is the patient currently on ACE/ARB medication? No Does the patient have >5 day gap between last estimated fill dates? No   Wendy Poet, [place] [phone]

## 2021-02-19 DIAGNOSIS — N182 Chronic kidney disease, stage 2 (mild): Secondary | ICD-10-CM | POA: Diagnosis not present

## 2021-02-19 DIAGNOSIS — I10 Essential (primary) hypertension: Secondary | ICD-10-CM | POA: Diagnosis not present

## 2021-02-19 DIAGNOSIS — E1165 Type 2 diabetes mellitus with hyperglycemia: Secondary | ICD-10-CM | POA: Diagnosis not present

## 2021-02-19 DIAGNOSIS — E782 Mixed hyperlipidemia: Secondary | ICD-10-CM | POA: Diagnosis not present

## 2021-02-22 DIAGNOSIS — I129 Hypertensive chronic kidney disease with stage 1 through stage 4 chronic kidney disease, or unspecified chronic kidney disease: Secondary | ICD-10-CM | POA: Diagnosis not present

## 2021-02-22 DIAGNOSIS — N182 Chronic kidney disease, stage 2 (mild): Secondary | ICD-10-CM | POA: Diagnosis not present

## 2021-02-22 DIAGNOSIS — R809 Proteinuria, unspecified: Secondary | ICD-10-CM | POA: Diagnosis not present

## 2021-02-22 DIAGNOSIS — E782 Mixed hyperlipidemia: Secondary | ICD-10-CM | POA: Diagnosis not present

## 2021-02-22 DIAGNOSIS — E1142 Type 2 diabetes mellitus with diabetic polyneuropathy: Secondary | ICD-10-CM | POA: Diagnosis not present

## 2021-02-22 DIAGNOSIS — E1165 Type 2 diabetes mellitus with hyperglycemia: Secondary | ICD-10-CM | POA: Diagnosis not present

## 2021-03-06 ENCOUNTER — Telehealth: Payer: Self-pay | Admitting: Pharmacist

## 2021-03-06 NOTE — Progress Notes (Signed)
Chronic Care Management Pharmacy Assistant   Name: Nathan Frazier  MRN: [id number] DOB: 1951-09-29  Reason for Encounter: Disease State-Diabetes   Medications: Outpatient Encounter Medications as of 03/06/2021  Medication Sig   acetaminophen (TYLENOL) 500 MG tablet Take 1,000 mg by mouth daily as needed for moderate pain or headache.   aspirin 81 MG chewable tablet Chew 1 tablet (81 mg total) by mouth 2 (two) times daily.   atorvastatin (LIPITOR) 40 MG tablet Take 40 mg by mouth daily.   b complex vitamins tablet Take 1 tablet by mouth daily.   Emollient (CETAPHIL) cream Apply 1 application topically as needed (dry skin).   Empagliflozin-metFORMIN HCl ER (SYNJARDY XR) 12.04-999 MG TB24 Take 2 tablets by mouth daily.   Liraglutide (VICTOZA) 18 MG/3ML SOPN Inject 1.2 mg into the skin daily.    lisinopril (ZESTRIL) 20 MG tablet Take 1 tablet (20 mg total) by mouth daily.   methocarbamol (ROBAXIN) 500 MG tablet Take 1 tablet (500 mg total) by mouth every 6 (six) hours as needed for muscle spasms.   Multiple Vitamin (MULTIVITAMIN WITH MINERALS) TABS tablet Take 1 tablet by mouth daily.   pioglitazone (ACTOS) 15 MG tablet Take 15 mg by mouth daily.   Polyethyl Glycol-Propyl Glycol (SYSTANE OP) Place 1 drop into both eyes daily as needed (burning / irritation).   Propylhexedrine (BENZEDREX NA) Place 1 Dose into both nostrils daily as needed (congestion).   triamcinolone cream (KENALOG) 0.1 % APPLY TO AFFECTED AREA TWICE A DAY   No facility-administered encounter medications on file as of 03/06/2021.    Recent Relevant Labs: Lab Results  Component Value Date/Time   HGBA1C 6.4 (H) 11/12/2018 09:44 AM   HGBA1C 6.9 12/10/2017 12:00 AM   HGBA1C 6.6 09/11/2017 12:00 AM   MICROALBUR 40.0 (H) 07/26/2015 10:55 AM   MICROALBUR 84.5 (H) 06/29/2013 08:53 AM    Kidney Function Lab Results  Component Value Date/Time   CREATININE 1.16 11/20/2018 03:50 AM   CREATININE 1.03 11/12/2018  09:44 AM   GFR 66.22 07/26/2015 10:55 AM   GFRNONAA >60 11/20/2018 03:50 AM   GFRAA >60 11/20/2018 03:50 AM     Current antihyperglycemic regimen:   Victoza 18 mg/25ml  Synjardy XR 12.5-1000mg  PO TB24   What recent interventions/DTPs have been made to improve glycemic control:                    Patient stated his pcp will be changing his victoza medication to trulocity once he finishes his victoza medication.  Have there been any recent hospitalizations or ED visits since last visit with CPP? No  Patient denies hypoglycemic symptoms, including None         Patient denies hyperglycemic symptoms, including none   How often are you checking your blood sugar? 3-4 times daily  What are your blood sugars ranging?  Patient was not home during this call, he stated that his blood sugars range from 85-197. His monitor records the blood sugar results  And uploads to his endocrine office.  During the week, how often does your blood glucose drop below 70? Never   Are you checking your feet daily/regularly?  Patients states that he has no problem with his feet at this time but they do have a foul ordor.  Adherence Review: Is the patient currently on a STATIN medication? Yes Is the patient currently on ACE/ARB medication? Yes Does the patient have >5 day gap between last estimated fill dates? Yes Victoza  18 mg/35ml - Last fill 10/08/20 90D Synjardy XR 12.5-1000mg  PO TB24 Last fill 11/15/20 30D   Patient would like to speak to pharmacist about getting help to cover his one touch test strips due to new insurance [facility]) Not covering the test strips.   [place] ,[place] Pharmacist Assistant [phone]   Time Spent : 67 Minutes

## 2021-04-19 ENCOUNTER — Telehealth: Payer: Medicare Other

## 2021-05-22 DIAGNOSIS — E782 Mixed hyperlipidemia: Secondary | ICD-10-CM | POA: Diagnosis not present

## 2021-05-22 DIAGNOSIS — N182 Chronic kidney disease, stage 2 (mild): Secondary | ICD-10-CM | POA: Diagnosis not present

## 2021-05-22 DIAGNOSIS — I10 Essential (primary) hypertension: Secondary | ICD-10-CM | POA: Diagnosis not present

## 2021-05-22 DIAGNOSIS — E1165 Type 2 diabetes mellitus with hyperglycemia: Secondary | ICD-10-CM | POA: Diagnosis not present

## 2021-05-24 DIAGNOSIS — N182 Chronic kidney disease, stage 2 (mild): Secondary | ICD-10-CM | POA: Diagnosis not present

## 2021-05-24 DIAGNOSIS — I129 Hypertensive chronic kidney disease with stage 1 through stage 4 chronic kidney disease, or unspecified chronic kidney disease: Secondary | ICD-10-CM | POA: Diagnosis not present

## 2021-05-24 DIAGNOSIS — Z79899 Other long term (current) drug therapy: Secondary | ICD-10-CM | POA: Diagnosis not present

## 2021-05-24 DIAGNOSIS — E1165 Type 2 diabetes mellitus with hyperglycemia: Secondary | ICD-10-CM | POA: Diagnosis not present

## 2021-05-24 DIAGNOSIS — E1142 Type 2 diabetes mellitus with diabetic polyneuropathy: Secondary | ICD-10-CM | POA: Diagnosis not present

## 2021-05-24 DIAGNOSIS — E782 Mixed hyperlipidemia: Secondary | ICD-10-CM | POA: Diagnosis not present

## 2021-05-24 DIAGNOSIS — E1129 Type 2 diabetes mellitus with other diabetic kidney complication: Secondary | ICD-10-CM | POA: Diagnosis not present

## 2021-05-24 DIAGNOSIS — R809 Proteinuria, unspecified: Secondary | ICD-10-CM | POA: Diagnosis not present

## 2021-05-24 DIAGNOSIS — E1122 Type 2 diabetes mellitus with diabetic chronic kidney disease: Secondary | ICD-10-CM | POA: Diagnosis not present

## 2021-06-06 ENCOUNTER — Telehealth: Payer: Self-pay | Admitting: Pharmacist

## 2021-06-06 NOTE — Progress Notes (Signed)
Chronic Care Management Pharmacy Assistant   Name: Nathan Frazier  MRN: [id number] DOB: [birth year]   Reason for Encounter: Disease State   Conditions to be addressed/monitored: DMII  Primary concerns for visit include: Blood Sugar   Recent office visits:  None ID  Recent consult visits:  None ID  Hospital visits:  None in previous 6 months  Medications: Outpatient Encounter Medications as of 06/06/2021  Medication Sig   acetaminophen (TYLENOL) 500 MG tablet Take 1,000 mg by mouth daily as needed for moderate pain or headache.   aspirin 81 MG chewable tablet Chew 1 tablet (81 mg total) by mouth 2 (two) times daily.   atorvastatin (LIPITOR) 40 MG tablet Take 40 mg by mouth daily.   b complex vitamins tablet Take 1 tablet by mouth daily.   Emollient (CETAPHIL) cream Apply 1 application topically as needed (dry skin).   Empagliflozin-metFORMIN HCl ER (SYNJARDY XR) 12.04-999 MG TB24 Take 2 tablets by mouth daily.   Liraglutide (VICTOZA) 18 MG/3ML SOPN Inject 1.2 mg into the skin daily.    lisinopril (ZESTRIL) 20 MG tablet Take 1 tablet (20 mg total) by mouth daily.   methocarbamol (ROBAXIN) 500 MG tablet Take 1 tablet (500 mg total) by mouth every 6 (six) hours as needed for muscle spasms.   Multiple Vitamin (MULTIVITAMIN WITH MINERALS) TABS tablet Take 1 tablet by mouth daily.   pioglitazone (ACTOS) 15 MG tablet Take 15 mg by mouth daily.   Polyethyl Glycol-Propyl Glycol (SYSTANE OP) Place 1 drop into both eyes daily as needed (burning / irritation).   Propylhexedrine (BENZEDREX NA) Place 1 Dose into both nostrils daily as needed (congestion).   triamcinolone cream (KENALOG) 0.1 % APPLY TO AFFECTED AREA TWICE A DAY   No facility-administered encounter medications on file as of 06/06/2021.    Pharmacist Review  Recent Relevant Labs: Lab Results  Component Value Date/Time   HGBA1C 6.4 (H) 11/12/2018 09:44 AM   HGBA1C 6.9 12/10/2017 12:00 AM   HGBA1C 6.6 09/11/2017 12:00  AM   MICROALBUR 40.0 (H) 07/26/2015 10:55 AM   MICROALBUR 84.5 (H) 06/29/2013 08:53 AM    Kidney Function Lab Results  Component Value Date/Time   CREATININE 1.16 11/20/2018 03:50 AM   CREATININE 1.03 11/12/2018 09:44 AM   GFR 66.22 07/26/2015 10:55 AM   GFRNONAA >60 11/20/2018 03:50 AM   GFRAA >60 11/20/2018 03:50 AM    Current antihyperglycemic regimen:  Synjardy 12.04-999 mg 2 tabs daily  What recent interventions/DTPs have been made to improve glycemic control:  Patient states that pcp changed his Victoza to another medication which he does not know at this time. He would like to know from [place] the right time to take this new medication.   Have there been any recent hospitalizations or ED visits since last visit with CPP? No Patient denies hypoglycemic symptoms, including None Patient denies hyperglycemic symptoms, including none How often are you checking your blood sugar? twice daily What are your blood sugars ranging? Patient states that his blood sugars run between 125-145 Fasting: yes Before meals: no After meals: no Bedtime: yes During the week, how often does your blood glucose drop below 70? Never Are you checking your feet daily/regularly? Patient states that he does not have any issue with feet  Adherence Review: Is the patient currently on a STATIN medication? Yes Is the patient currently on ACE/ARB medication? Yes Does the patient have >5 day gap between last estimated fill dates? Nathan Frazier states he should be getting  a refill soon    Star Rating Drugs: Lisinopril 12/30/20 90 d  Nathan Frazier Clinical Pharmacist Assistant [phone]  Time spent:30

## 2021-06-18 NOTE — Addendum Note (Signed)
Addended by: Charlton Haws on: 06/18/2021 09:48 PM   Modules accepted: Orders

## 2021-06-27 NOTE — Progress Notes (Signed)
Reviewed chart for medication changes and adherence.  No OVs, Consults, or hospital visits since last care coordination call / Pharmacist visit. No medication changes indicated  No gaps in adherence identified. Patient has follow up scheduled with pharmacy team. No further action required.   Orinda Kenner, [place] Clinical Pharmacists Assistant [phone]  Time Spent: [phone]

## 2021-07-15 ENCOUNTER — Ambulatory Visit: Payer: Medicare Other

## 2021-08-05 ENCOUNTER — Ambulatory Visit (INDEPENDENT_AMBULATORY_CARE_PROVIDER_SITE_OTHER): Payer: Medicare Other | Admitting: Internal Medicine

## 2021-08-05 ENCOUNTER — Other Ambulatory Visit: Payer: Self-pay

## 2021-08-05 ENCOUNTER — Encounter: Payer: Self-pay | Admitting: Internal Medicine

## 2021-08-05 VITALS — BP 128/64 | HR 60 | Temp 98.4°F | Resp 18 | Ht 68.0 in | Wt 174.0 lb

## 2021-08-05 DIAGNOSIS — E785 Hyperlipidemia, unspecified: Secondary | ICD-10-CM

## 2021-08-05 DIAGNOSIS — R21 Rash and other nonspecific skin eruption: Secondary | ICD-10-CM

## 2021-08-05 DIAGNOSIS — E118 Type 2 diabetes mellitus with unspecified complications: Secondary | ICD-10-CM

## 2021-08-05 DIAGNOSIS — I10 Essential (primary) hypertension: Secondary | ICD-10-CM

## 2021-08-05 DIAGNOSIS — E1169 Type 2 diabetes mellitus with other specified complication: Secondary | ICD-10-CM | POA: Diagnosis not present

## 2021-08-05 NOTE — Progress Notes (Signed)
   Subjective:   Patient ID: RUSHTON DIVAN, male    DOB: August 18, 1951, 70 y.o.   MRN: [id number]  HPI The patient is a 70 YO man coming in for yearly follow up of medical problems and new poison ivy exposure several weeks ago. Improving some on its own.   Review of Systems  Constitutional: Negative.   HENT: Negative.    Eyes: Negative.   Respiratory:  Negative for cough, chest tightness and shortness of breath.   Cardiovascular:  Negative for chest pain, palpitations and leg swelling.  Gastrointestinal:  Negative for abdominal distention, abdominal pain, constipation, diarrhea, nausea and vomiting.  Musculoskeletal: Negative.   Skin:  Positive for rash.  Neurological: Negative.   Psychiatric/Behavioral: Negative.     Objective:  Physical Exam Constitutional:      Appearance: He is well-developed.  HENT:     Head: Normocephalic and atraumatic.  Cardiovascular:     Rate and Rhythm: Normal rate and regular rhythm.  Pulmonary:     Effort: Pulmonary effort is normal. No respiratory distress.     Breath sounds: Normal breath sounds. No wheezing or rales.  Abdominal:     General: Bowel sounds are normal. There is no distension.     Palpations: Abdomen is soft.     Tenderness: There is no abdominal tenderness. There is no rebound.  Musculoskeletal:     Cervical back: Normal range of motion.  Skin:    General: Skin is warm and dry.     Findings: Rash present.     Comments: Foot exam done  Neurological:     Mental Status: He is alert and oriented to person, place, and time.     Coordination: Coordination normal.    Vitals:   08/05/21 0850  BP: 128/64  Pulse: 60  Resp: 18  Temp: 98.4 F (36.9 C)  TempSrc: Oral  SpO2: 98%  Weight: 174 lb (78.9 kg)  Height: '5\' 8"'$  (1.727 m)    This visit occurred during the SARS-CoV-2 public health emergency.  Safety protocols were in place, including screening questions prior to the visit, additional usage of staff PPE, and extensive  cleaning of exam room while observing appropriate contact time as indicated for disinfecting solutions.   Assessment & Plan:

## 2021-08-05 NOTE — Patient Instructions (Signed)
Keep up the good work and consider trying exercise to help more with the sugars.

## 2021-08-06 NOTE — Assessment & Plan Note (Signed)
Recent HgA1c appropriate at endo and taking ozempic 0.5. weekly and synjardy 25/2000 mg daily. Is on ACE-I and statin. Foot exam done. Eye exam due and reminded to schedule and maintain yearly.

## 2021-08-06 NOTE — Assessment & Plan Note (Signed)
BP at goal on lisinopril 20 mg daily. Recent labs without indication for change so will continue.

## 2021-08-06 NOTE — Assessment & Plan Note (Signed)
Overall resolving and advised to continue cortisone otc. Advised to wear protective clothing when working in the yard to help avoid exposure.

## 2021-08-06 NOTE — Assessment & Plan Note (Signed)
Recent lipid panel at goal on lipitor 40 mg daily. Will continue.

## 2021-08-28 DIAGNOSIS — E782 Mixed hyperlipidemia: Secondary | ICD-10-CM | POA: Diagnosis not present

## 2021-08-28 DIAGNOSIS — I10 Essential (primary) hypertension: Secondary | ICD-10-CM | POA: Diagnosis not present

## 2021-08-28 DIAGNOSIS — E1165 Type 2 diabetes mellitus with hyperglycemia: Secondary | ICD-10-CM | POA: Diagnosis not present

## 2021-08-30 DIAGNOSIS — R809 Proteinuria, unspecified: Secondary | ICD-10-CM | POA: Diagnosis not present

## 2021-08-30 DIAGNOSIS — I10 Essential (primary) hypertension: Secondary | ICD-10-CM | POA: Diagnosis not present

## 2021-08-30 DIAGNOSIS — E1129 Type 2 diabetes mellitus with other diabetic kidney complication: Secondary | ICD-10-CM | POA: Diagnosis not present

## 2021-08-30 DIAGNOSIS — Z7984 Long term (current) use of oral hypoglycemic drugs: Secondary | ICD-10-CM | POA: Diagnosis not present

## 2021-08-30 DIAGNOSIS — E1142 Type 2 diabetes mellitus with diabetic polyneuropathy: Secondary | ICD-10-CM | POA: Diagnosis not present

## 2021-08-30 DIAGNOSIS — E11649 Type 2 diabetes mellitus with hypoglycemia without coma: Secondary | ICD-10-CM | POA: Diagnosis not present

## 2021-08-30 DIAGNOSIS — E785 Hyperlipidemia, unspecified: Secondary | ICD-10-CM | POA: Diagnosis not present

## 2021-08-30 DIAGNOSIS — E1165 Type 2 diabetes mellitus with hyperglycemia: Secondary | ICD-10-CM | POA: Diagnosis not present

## 2021-08-30 DIAGNOSIS — Z79899 Other long term (current) drug therapy: Secondary | ICD-10-CM | POA: Diagnosis not present

## 2021-08-30 DIAGNOSIS — E1169 Type 2 diabetes mellitus with other specified complication: Secondary | ICD-10-CM | POA: Diagnosis not present

## 2021-09-25 ENCOUNTER — Telehealth: Payer: Self-pay | Admitting: Pharmacist

## 2021-09-25 NOTE — Progress Notes (Signed)
Chronic Care Management Pharmacy Assistant   Name: Nathan Frazier  MRN: [id number] DOB: Jun 29, 1951  Reason for Encounter: Disease State - Diabetes  Recent office visits:  08/05/21 Nathan Frazier (PCP) - Controlled type 2 diabetes mellitus with complication, without long-term current use of insulin. No med changes.  Recent consult visits:  None noted  Hospital visits:  None in previous 6 months  Medications: Outpatient Encounter Medications as of 09/25/2021  Medication Sig   acetaminophen (TYLENOL) 500 MG tablet Take 1,000 mg by mouth daily as needed for moderate pain or headache.   aspirin 81 MG chewable tablet Chew 1 tablet (81 mg total) by mouth 2 (two) times daily.   atorvastatin (LIPITOR) 40 MG tablet Take 40 mg by mouth daily.   b complex vitamins tablet Take 1 tablet by mouth daily.   Emollient (CETAPHIL) cream Apply 1 application topically as needed (dry skin).   Empagliflozin-metFORMIN HCl ER (SYNJARDY XR) 12.04-999 MG TB24 Take 2 tablets by mouth daily.   lisinopril (ZESTRIL) 20 MG tablet Take 1 tablet (20 mg total) by mouth daily.   methocarbamol (ROBAXIN) 500 MG tablet Take 1 tablet (500 mg total) by mouth every 6 (six) hours as needed for muscle spasms.   Multiple Vitamin (MULTIVITAMIN WITH MINERALS) TABS tablet Take 1 tablet by mouth daily.   pioglitazone (ACTOS) 15 MG tablet Take 15 mg by mouth daily.   Polyethyl Glycol-Propyl Glycol (SYSTANE OP) Place 1 drop into both eyes daily as needed (burning / irritation).   Propylhexedrine (BENZEDREX NA) Place 1 Dose into both nostrils daily as needed (congestion).   Semaglutide,0.25 or 0.5MG /DOS, (OZEMPIC, 0.25 OR 0.5 MG/DOSE,) 2 MG/1.5ML SOPN Inject 0.5 mg into the skin once a week.   triamcinolone cream (KENALOG) 0.1 % APPLY TO AFFECTED AREA TWICE A DAY   No facility-administered encounter medications on file as of 09/25/2021.    Recent Relevant Labs: Lab Results  Component Value Date/Time   HGBA1C 6.4 (H) 11/12/2018 09:44  AM   HGBA1C 6.9 12/10/2017 12:00 AM   HGBA1C 6.6 09/11/2017 12:00 AM   MICROALBUR 40.0 (H) 07/26/2015 10:55 AM   MICROALBUR 84.5 (H) 06/29/2013 08:53 AM    Kidney Function Lab Results  Component Value Date/Time   CREATININE 1.16 11/20/2018 03:50 AM   CREATININE 1.03 11/12/2018 09:44 AM   GFR 66.22 07/26/2015 10:55 AM   GFRNONAA >60 11/20/2018 03:50 AM   GFRAA >60 11/20/2018 03:50 AM   Contacted patient on 09/25/21 to discuss diabetes disease state.   Current antihyperglycemic regimen:   Synjardy 12.04-999 mg 2 tabs daily Ozempic 0.5. weekly   Patient verbally confirms he is taking the above medications as directed. Yes  What diet changes have been made to improve diabetes control? Patient states no new diet changes.  What recent interventions/DTPs have been made to improve glycemic control:  Last visit with PCP she stated to exercise more to help lower the sugars.   Have there been any recent hospitalizations or ED visits since last visit with CPP? No  Patient denies hypoglycemic symptoms, including None  Patient denies hyperglycemic symptoms, including none  How often are you checking your blood sugar? twice daily  What are your blood sugars ranging?  Patient states glucose has been running around 115 since taking Ozempic.  During the week, how often does your blood glucose drop below 70? Never  Are you checking your feet daily/regularly? No  Adherence Review: Is the patient currently on a STATIN medication? Yes Is the patient currently  on ACE/ARB medication? Yes Does the patient have >5 day gap between last estimated fill dates? No  Care Gaps: Annual wellness visit in last year? No Most Recent BP reading: 128/64  If Diabetic: Most recent A1C reading:6.4 Last eye exam / retinopathy screening: 12/10/20 Last diabetic foot exam: 08/05/21  Star Rating Drugs:  Medication:  Last Fill: Day Supply Lisinopril   03/27/21  90 Atorvastatin  12/30/20  90 Synjardy  XR  06/25/21 90 Ozempic    06/25/21 28  Patient states he had extra medications on hand for Lisinopril & Atorvastatin on hand but now needs a refill. Patient states he is interested in learning more about [facility], he switch insurances and is now paying more for his meds.  No appointments scheduled within the next 30 days.   Orinda Kenner, [place] Clinical Pharmacists Assistant [phone]  Time Spent: [phone]

## 2021-10-10 ENCOUNTER — Other Ambulatory Visit: Payer: Self-pay

## 2021-10-10 ENCOUNTER — Telehealth: Payer: Self-pay | Admitting: Pharmacist

## 2021-10-10 ENCOUNTER — Ambulatory Visit (INDEPENDENT_AMBULATORY_CARE_PROVIDER_SITE_OTHER): Payer: Medicare Other | Admitting: Pharmacist

## 2021-10-10 DIAGNOSIS — E118 Type 2 diabetes mellitus with unspecified complications: Secondary | ICD-10-CM

## 2021-10-10 DIAGNOSIS — E1169 Type 2 diabetes mellitus with other specified complication: Secondary | ICD-10-CM

## 2021-10-10 DIAGNOSIS — I10 Essential (primary) hypertension: Secondary | ICD-10-CM

## 2021-10-10 MED ORDER — LISINOPRIL 20 MG PO TABS: 20.0000 mg | ORAL_TABLET | Freq: Every day | ORAL | 1 refills | Status: DC

## 2021-10-10 NOTE — Progress Notes (Signed)
Called [facility] office (Dr Grandville Silos) and spoke with [name] to request a refill on Atorvastatin 40 mg. Prescription will be sent to [facility] on [address].  Orinda Kenner, [place] Clinical Pharmacists Assistant [phone]

## 2021-10-10 NOTE — Patient Instructions (Signed)
Visit Information  Phone number for Pharmacist: [phone]   Goals Addressed             This Visit's Progress    Manage My Medicine       Timeframe:  Long-Range Goal Priority:  High Start Date:   10/10/21                          Expected End Date:  10/10/22                     Follow Up Date April 2023   - call for medicine refill 2 or 3 days before it runs out - call if I am sick and can't take my medicine - keep a list of all the medicines I take; vitamins and herbals too - use a pillbox to sort medicine  -Contact [employer] for Medicare insurance counseling [phone])   Why is this important?   These steps will help you keep on track with your medicines.   Notes:         Care Plan : [place]  Updates made by Charlton Haws, RPH since 10/10/2021 12:00 AM     Problem: Hypertension, Hyperlipidemia, and Diabetes   Priority: High     Long-Range Goal: Disease management   Start Date: 10/10/2021  Expected End Date: 10/10/2022  This Visit's Progress: On track  Priority: High  Note:   Current Barriers:  Unable to independently monitor therapeutic efficacy Questionable adherence to prescribed medications  Pharmacist Clinical Goal(s):  Patient will achieve adherence to monitoring guidelines and medication adherence to achieve therapeutic efficacy adhere to prescribed medication regimen as evidenced by fill dates through collaboration with PharmD and provider.   Interventions: 1:1 collaboration with Hoyt Koch, MD regarding development and update of comprehensive plan of care as evidenced by provider attestation and co-signature Inter-disciplinary care team collaboration (see longitudinal plan of care) Comprehensive medication review performed; medication list updated in electronic medical record   Diabetes  A1c goal < 7% [facility] labs 08/28/21: A1c 6.5% Checking BG: Daily Recent FBG Readings: 110s   Patient has failed these meds  in past: glimepiride, glipizide, Januvia, Onglyza, Speed, Victoza Patient is currently controlled on the following medications:  Synjardy 12.04-999 mg - 2 daily  Ozempic 0.5 mg weekly - taking 0.25 mg weekly Pioglitazone 15 mg daily    We discussed: Pt reports a couple weeks ago he had an episode of dizziness lasting 30-45 min; his BG was normal at the time; he did not check BP; he denies any other symptoms; he had increased Ozempic to 0.5 mg about 1-2 weeks prior to this episode so he assumed it was a side effect, he has since reduced his dose back to 0.25 mg weekly and has not had any further issues   Plan: Continue current medications and control with diet and exercise    Hypertension    BP goal < 130/80 Patient checks BP at home infrequently  Patient has failed these meds in the past: n/a Patient is currently controlled on the following medications:  Lisinopril 20 mg daily   We discussed: BP goals; pt does not have a BP monitor; he reports 1 recent episode of dizziness (see above); he is not sure if he has been taking lisinopril (last filled March 2022), and last prescribed 07/2020 x 1 year so it is unlikely he has any left; counseled on importance of lisinopril and  advised he restart medication if he has been off of it  Plan: Refilled lisinopril 20 mg to [facility]   Hyperlipidemia    LDL goal < 100 [facility] labs 08/27/21: LDL 73  Patient has failed these meds in past: Welchol, rosuvastatin, ezetimibe, pravastatin Patient is currently controlled on the following medications:  atorvastatin 40 mg daily,  aspirin 81 mg daily   We discussed: Cholesterol goals; benefits of statin for ASCVD risk reduction; LDL has improved from 111 to 83 since 2018 (~26%); pt is not sure if he is taking atorvastatin; per surescripts last fill 12/2020 but pt reports he did have extra supply and if he ran out it was recently; advised to restart atorvastatin if he is not taking it   Plan: Coordinated with  endocrine to refill atorvastatin to [facility]   Medication Management    We discussed:  Pt is interested in optimizing insurance plan; advised him to consult with [employer] to find best plan for his needs; emailed the contact information to him and advised him to call during open enrollment (until Dec 04 2021)   Plan: Pt to contact [employer] to optimize drug plan   Patient Goals/Self-Care Activities Patient will:  - take medications as prescribed focus on medication adherence by pill box check glucose daily check blood pressure periodically -Contact [employer] for Medicare insurance counseling [phone])      Patient verbalizes understanding of instructions provided today and agrees to view in [place].  Telephone follow up appointment with pharmacy team member scheduled for: 6 months  Charlene Brooke, PharmD, [place], CPP Clinical Pharmacist [facility] [facility] [phone]

## 2021-10-10 NOTE — Progress Notes (Signed)
Chronic Care Management Pharmacy Note  10/10/2021 Name:  Nathan Frazier MRN:  [id number] DOB:  1951/05/17  Summary: -Pt is not sure if he is taking lisinopril or atorvastatin; per surescripts lisinopril last fill 03/27/21 x 90 ds and atorvastatin last fill 12/30/2020 x 90 ds; counseled to restart these medications if he has stopped -Pt reduced Ozempic back to 0.25 mg weekly due to an episode of dizziness 1-2 weeks after he increased the dose to 0.5 mg; he denies dizziness since reducing the dose; fasting BG stable in 110-130 -Pt reports cost issues with medications/issues with his insurance plan; he is interesting in finding a new plan  Recommendations/Changes made from today's visit: -Refilled lisinopril to [facility]; coordinated with endocrine to refill atorvastatin -Counseled to try increase Ozempic back to 0.5 mg weekly -Referred pt to [facility] for insurance counseling - provided local phone number   Subjective: Nathan Frazier is an 70 y.o. year old male who is a primary patient of Hoyt Koch, MD.  The CCM team was consulted for assistance with disease management and care coordination needs.    Engaged with patient by telephone for follow up visit in response to provider referral for pharmacy case management and/or care coordination services.   Consent to Services:  The patient was given information about Chronic Care Management services, agreed to services, and gave verbal consent prior to initiation of services.  Please see initial visit note for detailed documentation.   Patient Care Team: Hoyt Koch, MD as PCP - General (Internal Medicine) [place], [place], [place] (Optometry) Charlton Haws, [place] (Pharmacist)  Recent office visits: 08/05/21 Dr Sharlet Salina OV: annual exam, c/o poison ivy; recent labs stable; no med changes  Recent consult visits: 08/30/21 Dr Grandville Silos [facility] continue Ozempic 0.5, Synjardy 12.04-999 2 daily, pioglitazone 15 mg  05/24/21 Dr  [place] [facility] endocrine): consider switching to Ozempic.  Hospital visits: None in previous 6 months   Objective:  Lab Results  Component Value Date   CREATININE 1.16 11/20/2018   BUN 21 11/20/2018   GFR 66.22 07/26/2015   GFRNONAA >60 11/20/2018   GFRAA >60 11/20/2018   NA 139 11/20/2018   K 4.4 11/20/2018   CALCIUM 8.7 (L) 11/20/2018   CO2 25 11/20/2018   GLUCOSE 189 (H) 11/20/2018    Lab Results  Component Value Date/Time   HGBA1C 6.4 (H) 11/12/2018 09:44 AM   HGBA1C 6.9 12/10/2017 12:00 AM   HGBA1C 6.6 09/11/2017 12:00 AM   GFR 66.22 07/26/2015 10:55 AM   GFR 65.73 10/16/2014 01:42 PM   MICROALBUR 40.0 (H) 07/26/2015 10:55 AM   MICROALBUR 84.5 (H) 06/29/2013 08:53 AM    Last diabetic Eye exam:  Lab Results  Component Value Date/Time   HMDIABEYEEXA No Retinopathy 10/14/2016 12:00 AM    Last diabetic Foot exam:  Lab Results  Component Value Date/Time   HMDIABFOOTEX yes 03/26/2010 12:00 AM     Lab Results  Component Value Date   CHOL 185 12/10/2017   HDL 36 12/10/2017   LDLCALC 111 12/10/2017   LDLDIRECT 143.3 07/20/2014   TRIG 192 (A) 12/10/2017   CHOLHDL 8 07/20/2014    Hepatic Function Latest Ref Rng & Units 12/10/2017 09/11/2017 07/26/2015  Total Protein 6.0 - 8.3 g/dL - - 7.2  Albumin 3.5 - 5.2 g/dL - - 4.1  AST 14 - 40 _0 ALT 10 - 40 _1 Alk Phosphatase 25 - 125 58 63 82  Total Bilirubin 0.2 -  1.2 mg/dL - - 0.6  Bilirubin, Direct 0.0 - 0.3 mg/dL - - -    Lab Results  Component Value Date/Time   TSH 4.80 07/24/2011 08:16 AM   TSH 2.48 03/21/2010 08:51 AM    CBC Latest Ref Rng & Units 11/20/2018 11/12/2018 07/24/2011  WBC 4.0 - 10.5 K/uL 12.2(H) 5.4 6.9  Hemoglobin 13.0 - 17.0 g/dL 14.7 17.1(H) 14.8  Hematocrit 39.0 - 52.0 % 45.4 52.1(H) 44.8  Platelets 150 - 400 K/uL 145(L) 148(L) 194.0    No results found for: VD25OH  Clinical ASCVD: No  The ASCVD Risk score (Arnett DK, et al., 2019) failed to calculate for the  following reasons:   The valid total cholesterol range is 130 to 320 mg/dL    Depression screen [facility] 2/9 08/10/2020 11/13/2017 09/11/2016  Decreased Interest 0 0 0  Down, Depressed, Hopeless 0 0 0  PHQ - 2 Score 0 0 0  Altered sleeping - 0 -  Tired, decreased energy - 0 -  Change in appetite - 0 -  Feeling bad or failure about yourself  - 0 -  Trouble concentrating - 0 -  Moving slowly or fidgety/restless - 0 -  Suicidal thoughts - 0 -  PHQ-9 Score - 0 -  Difficult doing work/chores - Not difficult at all -      Social History   Tobacco Use  Smoking Status Never  Smokeless Tobacco Never   BP Readings from Last 3 Encounters:  08/05/21 128/64  08/10/20 (!) 144/86  11/18/19 132/83   Pulse Readings from Last 3 Encounters:  08/05/21 60  08/10/20 75  11/18/19 72   Wt Readings from Last 3 Encounters:  08/05/21 174 lb (78.9 kg)  08/10/20 179 lb (81.2 kg)  11/18/19 172 lb (78 kg)   BMI Readings from Last 3 Encounters:  08/05/21 26.46 kg/m  08/10/20 27.22 kg/m  11/18/19 26.15 kg/m    Assessment/Interventions: Review of patient past medical history, allergies, medications, health status, including review of consultants reports, laboratory and other test data, was performed as part of comprehensive evaluation and provision of chronic care management services.   SDOH:  (Social Determinants of Health) assessments and interventions performed: Yes  SDOH Screenings   Alcohol Screen: Not on file  Depression (PHQ2-9): Not on file  Financial Resource Strain: Not on file  Food Insecurity: Not on file  Housing: Not on file  Physical Activity: Not on file  Social Connections: Not on file  Stress: Not on file  Tobacco Use: Low Risk    Smoking Tobacco Use: Never   Smokeless Tobacco Use: Never  Transportation Needs: Not on file    [place]  Allergies  Allergen Reactions   Metformin And Related     heartburn   Pravastatin     confusion    Medications Reviewed  Today     Reviewed by Hoyt Koch, MD (Physician) on 08/05/21 at [place] List Status: <None>   Medication Order Taking? Sig Documenting Provider Last Dose Status Informant  acetaminophen (TYLENOL) 500 MG tablet [id number] Yes Take 1,000 mg by mouth daily as needed for moderate pain or headache. [provider] Taking Active Self  aspirin 81 MG chewable tablet [id number] Yes Chew 1 tablet (81 mg total) by mouth 2 (two) times daily. Mcarthur Rossetti, MD Taking Active   atorvastatin (LIPITOR) 40 MG tablet [id number] Yes Take 40 mg by mouth daily. [provider] Taking Active Self  b complex vitamins tablet [id number]  Yes Take 1 tablet by mouth daily. [provider] Taking Active Self  Emollient (CETAPHIL) cream [id number] Yes Apply 1 application topically as needed (dry skin). [provider] Taking Active Self  Empagliflozin-metFORMIN HCl ER (SYNJARDY XR) 12.04-999 MG TB24 [id number] Yes Take 2 tablets by mouth daily. [provider] Taking Active Self  lisinopril (ZESTRIL) 20 MG tablet [id number] Yes Take 1 tablet (20 mg total) by mouth daily. Hoyt Koch, MD Taking Active   methocarbamol (ROBAXIN) 500 MG tablet [id number] Yes Take 1 tablet (500 mg total) by mouth every 6 (six) hours as needed for muscle spasms. Mcarthur Rossetti, MD Taking Active   Multiple Vitamin (MULTIVITAMIN WITH MINERALS) TABS tablet [id number] Yes Take 1 tablet by mouth daily. [provider] Taking Active Self  pioglitazone (ACTOS) 15 MG tablet [id number] Yes Take 15 mg by mouth daily. [provider] Taking Active Self  Polyethyl Glycol-Propyl Glycol (SYSTANE OP) [id number] Yes Place 1 drop into both eyes daily as needed (burning / irritation). [provider] Taking Active Self  Propylhexedrine [facility] NA) [id number] Yes Place 1 Dose into both nostrils daily as needed (congestion). [provider] Taking Active Self   Semaglutide,0.25 or 0.5MG/DOS, (OZEMPIC, 0.25 OR 0.5 MG/DOSE,) 2 MG/1.5ML SOPN [id number] Yes Inject 0.5 mg into the skin once a week. [provider] Taking Active   triamcinolone cream (KENALOG) 0.1 % [id number] Yes APPLY TO AFFECTED AREA TWICE A DAY Hoyt Koch, MD Taking Active             Patient Active Problem List   Diagnosis Date Noted   Rash 05/04/2019   Unilateral primary osteoarthritis, left hip 01/26/2018   Routine health maintenance 02/05/2013   OA (osteoarthritis of the spine) 02/05/2013   OA (osteoarthritis) of finger 02/05/2013   ALLERGIC RHINITIS 03/08/2008   Hyperlipidemia associated with type 2 diabetes mellitus ([place]) 03/06/2008   Diabetes mellitus type 2, controlled, with complications ([place]) 18/40/3754   Essential hypertension 09/27/2007    Immunization History  Administered Date(s) Administered   Influenza, High Dose Seasonal PF 11/13/2017   Influenza-Unspecified 09/20/2018   Moderna Sars-Covid-2 Vaccination 01/28/2020, 02/25/2020   Pneumococcal Conjugate-13 07/20/2014   Pneumococcal Polysaccharide-23 02/03/2013   Tdap 02/03/2013   Zoster, Live 08/22/2015    Conditions to be addressed/monitored:  Hypertension, Hyperlipidemia, and Diabetes  Care Plan : [place]  Updates made by Charlton Haws, [place] since 10/10/2021 12:00 AM     Problem: Hypertension, Hyperlipidemia, and Diabetes   Priority: High     Long-Range Goal: Disease management   Start Date: 10/10/2021  Expected End Date: 10/10/2022  This Visit's Progress: On track  Priority: High  Note:   Current Barriers:  Unable to independently monitor therapeutic efficacy Questionable adherence to prescribed medications  Pharmacist Clinical Goal(s):  Patient will achieve adherence to monitoring guidelines and medication adherence to achieve therapeutic efficacy adhere to prescribed medication regimen as evidenced by fill dates through collaboration with  PharmD and provider.   Interventions: 1:1 collaboration with Hoyt Koch, MD regarding development and update of comprehensive plan of care as evidenced by provider attestation and co-signature Inter-disciplinary care team collaboration (see longitudinal plan of care) Comprehensive medication review performed; medication list updated in electronic medical record   Diabetes  A1c goal < 7% [facility] labs 08/28/21: A1c 6.5% Checking BG: Daily Recent FBG Readings: 110s   Patient has failed these meds in past: glimepiride, glipizide, Januvia, Onglyza, [place], [place] Patient is currently controlled on the following  medications:  Synjardy 12.04-999 mg - 2 daily  Ozempic 0.5 mg weekly - taking 0.25 mg weekly Pioglitazone 15 mg daily    We discussed: Pt reports a couple weeks ago he had an episode of dizziness lasting 30-45 min; his BG was normal at the time; he did not check BP; he denies any other symptoms; he had increased Ozempic to 0.5 mg about 1-2 weeks prior to this episode so he assumed it was a side effect, he has since reduced his dose back to 0.25 mg weekly and has not had any further issues   Plan: Continue current medications and control with diet and exercise    Hypertension    BP goal < 130/80 Patient checks BP at home infrequently  Patient has failed these meds in the past: n/a Patient is currently controlled on the following medications:  Lisinopril 20 mg daily   We discussed: BP goals; pt does not have a BP monitor; he reports 1 recent episode of dizziness (see above); he is not sure if he has been taking lisinopril (last filled March 2022), and last prescribed 07/2020 x 1 year so it is unlikely he has any left; counseled on importance of lisinopril and advised he restart medication if he has been off of it  Plan: Refilled lisinopril 20 mg to [facility]   Hyperlipidemia    LDL goal < 100 [facility] labs 08/27/21: LDL 73  Patient has failed these meds in past: Welchol,  rosuvastatin, ezetimibe, pravastatin Patient is currently controlled on the following medications:  atorvastatin 40 mg daily,  aspirin 81 mg daily   We discussed: Cholesterol goals; benefits of statin for ASCVD risk reduction; LDL has improved from 111 to 83 since 2018 (~26%); pt is not sure if he is taking atorvastatin; per surescripts last fill 12/2020 but pt reports he did have extra supply and if he ran out it was recently; advised to restart atorvastatin if he is not taking it   Plan: Coordinated with endocrine to refill atorvastatin to [facility]   Medication Management    We discussed:  Pt is interested in optimizing insurance plan; advised him to consult with [employer] to find best plan for his needs; emailed the contact information to him and advised him to call during open enrollment (until Dec 04 2021)   Plan: Pt to contact [employer] to optimize drug plan   Patient Goals/Self-Care Activities Patient will:  - take medications as prescribed focus on medication adherence by pill box check glucose daily check blood pressure periodically -Contact [employer] for Medicare insurance counseling [phone])      Medication Assistance: Pt is unhappy with insurance plan; referred to [facility] for counseling  Compliance/Adherence/Medication fill history: Care Gaps: Eye exam (10/14/17) A1c (05/13/19) - last done 08/28/21 per [facility]  Star-Rating Drugs: Synjardy - LF 06/25/21 x 90 ds Pioglitazone - LF 07/10/21 x 90 ds Ozempic - LF 06/25/21 x 28 ds  Atorvastatin - LF 12/30/20 x 90 ds ([place]) Lisinopril - LF 03/27/21 x 90 ds  Patient's preferred pharmacy is:  [facility] [address] [place] - [place] [address] [place] [place] [address] Phone: [phone] Fax: [phone]  Uses pill box? Yes Pt endorses 100% compliance - however lisinopril and atorvastatin are months overdue and pt is not sure if he has been putting these in his pill box  We discussed: Current  pharmacy is preferred with insurance plan and patient is satisfied with pharmacy services Patient decided to: Continue current medication management strategy  Care Plan and Follow Up Patient Decision:  Patient agrees to Care Plan and Follow-up.  Plan: Telephone follow up appointment with care management team member scheduled for:  6 months  Charlene Brooke, PharmD, [place], CPP Clinical Pharmacist [facility] [phone]

## 2021-10-28 DIAGNOSIS — E118 Type 2 diabetes mellitus with unspecified complications: Secondary | ICD-10-CM | POA: Diagnosis not present

## 2021-10-28 DIAGNOSIS — E785 Hyperlipidemia, unspecified: Secondary | ICD-10-CM

## 2021-10-28 DIAGNOSIS — I10 Essential (primary) hypertension: Secondary | ICD-10-CM

## 2021-10-28 DIAGNOSIS — E1169 Type 2 diabetes mellitus with other specified complication: Secondary | ICD-10-CM

## 2021-11-28 DIAGNOSIS — N182 Chronic kidney disease, stage 2 (mild): Secondary | ICD-10-CM | POA: Diagnosis not present

## 2021-11-28 DIAGNOSIS — E782 Mixed hyperlipidemia: Secondary | ICD-10-CM | POA: Diagnosis not present

## 2021-11-28 DIAGNOSIS — I10 Essential (primary) hypertension: Secondary | ICD-10-CM | POA: Diagnosis not present

## 2021-11-28 DIAGNOSIS — E1165 Type 2 diabetes mellitus with hyperglycemia: Secondary | ICD-10-CM | POA: Diagnosis not present

## 2021-11-29 DIAGNOSIS — Z7985 Long-term (current) use of injectable non-insulin antidiabetic drugs: Secondary | ICD-10-CM | POA: Diagnosis not present

## 2021-11-29 DIAGNOSIS — E785 Hyperlipidemia, unspecified: Secondary | ICD-10-CM | POA: Diagnosis not present

## 2021-11-29 DIAGNOSIS — S90424A Blister (nonthermal), right lesser toe(s), initial encounter: Secondary | ICD-10-CM | POA: Diagnosis not present

## 2021-11-29 DIAGNOSIS — Z7984 Long term (current) use of oral hypoglycemic drugs: Secondary | ICD-10-CM | POA: Diagnosis not present

## 2021-11-29 DIAGNOSIS — E1142 Type 2 diabetes mellitus with diabetic polyneuropathy: Secondary | ICD-10-CM | POA: Diagnosis not present

## 2021-11-29 DIAGNOSIS — E1122 Type 2 diabetes mellitus with diabetic chronic kidney disease: Secondary | ICD-10-CM | POA: Diagnosis not present

## 2021-11-29 DIAGNOSIS — N182 Chronic kidney disease, stage 2 (mild): Secondary | ICD-10-CM | POA: Diagnosis not present

## 2021-11-29 DIAGNOSIS — R809 Proteinuria, unspecified: Secondary | ICD-10-CM | POA: Diagnosis not present

## 2021-11-29 DIAGNOSIS — E1169 Type 2 diabetes mellitus with other specified complication: Secondary | ICD-10-CM | POA: Diagnosis not present

## 2021-11-29 DIAGNOSIS — I129 Hypertensive chronic kidney disease with stage 1 through stage 4 chronic kidney disease, or unspecified chronic kidney disease: Secondary | ICD-10-CM | POA: Diagnosis not present

## 2021-12-10 ENCOUNTER — Encounter: Payer: Self-pay | Admitting: Internal Medicine

## 2021-12-10 DIAGNOSIS — E119 Type 2 diabetes mellitus without complications: Secondary | ICD-10-CM | POA: Diagnosis not present

## 2021-12-10 LAB — HM DIABETES EYE EXAM

## 2022-01-15 ENCOUNTER — Telehealth: Payer: Self-pay

## 2022-01-15 NOTE — Progress Notes (Signed)
Chronic Care Management Pharmacy Assistant   Name: Nathan Frazier  MRN: [id number] DOB: 11-Jan-1951   Reason for Encounter: Disease State   Conditions to be addressed/monitored: DMII   Recent office visits:  None ID  Recent consult visits:  None ID  Hospital visits:  None in previous 6 months  Medications: Outpatient Encounter Medications as of 01/15/2022  Medication Sig   acetaminophen (TYLENOL) 500 MG tablet Take 1,000 mg by mouth daily as needed for moderate pain or headache.   aspirin 81 MG chewable tablet Chew 1 tablet (81 mg total) by mouth 2 (two) times daily.   atorvastatin (LIPITOR) 40 MG tablet Take 40 mg by mouth daily. (Patient not taking: Reported on 10/10/2021)   b complex vitamins tablet Take 1 tablet by mouth daily.   Emollient (CETAPHIL) cream Apply 1 application topically as needed (dry skin).   Empagliflozin-metFORMIN HCl ER (SYNJARDY XR) 12.04-999 MG TB24 Take 2 tablets by mouth daily.   lisinopril (ZESTRIL) 20 MG tablet Take 1 tablet (20 mg total) by mouth daily.   methocarbamol (ROBAXIN) 500 MG tablet Take 1 tablet (500 mg total) by mouth every 6 (six) hours as needed for muscle spasms.   Multiple Vitamin (MULTIVITAMIN WITH MINERALS) TABS tablet Take 1 tablet by mouth daily.   pioglitazone (ACTOS) 15 MG tablet Take 15 mg by mouth daily.   Polyethyl Glycol-Propyl Glycol (SYSTANE OP) Place 1 drop into both eyes daily as needed (burning / irritation).   Propylhexedrine (BENZEDREX NA) Place 1 Dose into both nostrils daily as needed (congestion).   Semaglutide,0.25 or 0.5MG /DOS, (OZEMPIC, 0.25 OR 0.5 MG/DOSE,) 2 MG/1.5ML SOPN Inject 0.25 mg into the skin once a week.   triamcinolone cream (KENALOG) 0.1 % APPLY TO AFFECTED AREA TWICE A DAY   No facility-administered encounter medications on file as of 01/15/2022.   Recent Relevant Labs: Lab Results  Component Value Date/Time   HGBA1C 6.4 (H) 11/12/2018 09:44 AM   HGBA1C 6.9 12/10/2017 12:00 AM   HGBA1C  6.6 09/11/2017 12:00 AM   MICROALBUR 40.0 (H) 07/26/2015 10:55 AM   MICROALBUR 84.5 (H) 06/29/2013 08:53 AM    Kidney Function Lab Results  Component Value Date/Time   CREATININE 1.16 11/20/2018 03:50 AM   CREATININE 1.03 11/12/2018 09:44 AM   GFR 66.22 07/26/2015 10:55 AM   GFRNONAA >60 11/20/2018 03:50 AM   GFRAA >60 11/20/2018 03:50 AM    Current antihyperglycemic regimen:  Ozempic 0.25mg  Synjardy 12.04-999 mg  What recent interventions/DTPs have been made to improve glycemic control:  None noted  Have there been any recent hospitalizations or ED visits since last visit with CPP? No  Patient denies hypoglycemic symptoms, including None Patient denies hyperglycemic symptoms, including none  How often are you checking your blood sugar? twice daily, patient also stated that he has been having trouble with getting test strips from the pharmacy, having to pay for them himself. He would like some help with this  What are your blood sugars ranging? 90-135 Fasting: 121  During the week, how often does your blood glucose drop below 70? Never  Are you checking your feet daily/regularly? Patient states that he is not having any issues with feet swelling, redness or open sores not healing   Adherence Review: Is the patient currently on a STATIN medication? No Is the patient currently on ACE/ARB medication? No Does the patient have >5 day gap between last estimated fill dates? No   Care Gaps: Colonoscopy-11/18/19 Diabetic Foot Exam-08/05/21 Ophthalmology-NA Dexa Scan -  NA Annual Well Visit - NA Micro albumin-NA Hemoglobin A1c- 08/28/21  Star Rating Drugs: Lisinopril 20 mg-last fill 03/27/21 90 ds  [name] Clinical Pharmacist Assistant [phone]

## 2022-03-10 ENCOUNTER — Telehealth: Payer: Self-pay

## 2022-03-10 NOTE — Progress Notes (Signed)
? ? ?Chronic Care Management ?Pharmacy Assistant  ? ?Name: Nathan Frazier  MRN: [id number] DOB: 1951/04/27 ? ?Nathan Frazier is an 71 y.o. year old male who presents for his follow-up CCM visit with the clinical pharmacist. ? ?Reason for Encounter: Disease State ?  ?Conditions to be addressed/monitored: ?DMII ? ? ?Recent office visits:  ?None ID ? ?Recent consult visits:  ?None ID ? ?Hospital visits:  ?None in previous 6 months ? ?Medications: ?Outpatient Encounter Medications as of 03/10/2022  ?Medication Sig  ? acetaminophen (TYLENOL) 500 MG tablet Take 1,000 mg by mouth daily as needed for moderate pain or headache.  ? aspirin 81 MG chewable tablet Chew 1 tablet (81 mg total) by mouth 2 (two) times daily.  ? atorvastatin (LIPITOR) 40 MG tablet Take 40 mg by mouth daily. (Patient not taking: Reported on 10/10/2021)  ? b complex vitamins tablet Take 1 tablet by mouth daily.  ? Emollient (CETAPHIL) cream Apply 1 application topically as needed (dry skin).  ? Empagliflozin-metFORMIN HCl ER (SYNJARDY XR) 12.04-999 MG TB24 Take 2 tablets by mouth daily.  ? lisinopril (ZESTRIL) 20 MG tablet Take 1 tablet (20 mg total) by mouth daily.  ? methocarbamol (ROBAXIN) 500 MG tablet Take 1 tablet (500 mg total) by mouth every 6 (six) hours as needed for muscle spasms.  ? Multiple Vitamin (MULTIVITAMIN WITH MINERALS) TABS tablet Take 1 tablet by mouth daily.  ? pioglitazone (ACTOS) 15 MG tablet Take 15 mg by mouth daily.  ? Polyethyl Glycol-Propyl Glycol (SYSTANE OP) Place 1 drop into both eyes daily as needed (burning / irritation).  ? Propylhexedrine (BENZEDREX NA) Place 1 Dose into both nostrils daily as needed (congestion).  ? Semaglutide,0.25 or 0.'5MG'$ /DOS, (OZEMPIC, 0.25 OR 0.5 MG/DOSE,) 2 MG/1.5ML SOPN Inject 0.25 mg into the skin once a week.  ? triamcinolone cream (KENALOG) 0.1 % APPLY TO AFFECTED AREA TWICE A DAY  ? ?No facility-administered encounter medications on file as of 03/10/2022.  ? ?Recent Relevant Labs: ?Lab  Results  ?Component Value Date/Time  ? HGBA1C 6.4 (H) 11/12/2018 09:44 AM  ? HGBA1C 6.9 12/10/2017 12:00 AM  ? HGBA1C 6.6 09/11/2017 12:00 AM  ? MICROALBUR 40.0 (H) 07/26/2015 10:55 AM  ? MICROALBUR 84.5 (H) 06/29/2013 08:53 AM  ?  ?Kidney Function ?Lab Results  ?Component Value Date/Time  ? CREATININE 1.16 11/20/2018 03:50 AM  ? CREATININE 1.03 11/12/2018 09:44 AM  ? GFR 66.22 07/26/2015 10:55 AM  ? GFRNONAA >60 11/20/2018 03:50 AM  ? GFRAA >60 11/20/2018 03:50 AM  ? ? ?Current antihyperglycemic regimen:  ?Ozempic 0.'25mg'$  ?Synjardy 12.04-999 mg ? ?What recent interventions/DTPs have been made to improve glycemic control:  ?None noted ? ?Have there been any recent hospitalizations or ED visits since last visit with CPP? No ? ?Patient denies hypoglycemic symptoms, including None ? ?Patient denies hyperglycemic symptoms, including none ? ?How often are you checking your blood sugar? Patient states that he checks blood sugar twice daily ? ?What are your blood sugars ranging? Patient states that his blood sugar ranges between 65-135 ? ?Before meals: 123 this morning ? ?During the week, how often does your blood glucose drop below 70? Never ? ?Are you checking your feet daily/regularly? Patient states that he does not have any issues with feet ? ?Adherence Review: ?Is the patient currently on a STATIN medication? No ?Is the patient currently on ACE/ARB medication? No ?Does the patient have >5 day gap between last estimated fill dates? No ? ? ?Care Gaps: ?Colonoscopy-11/18/19 ?Diabetic Foot  Exam-08/05/21 ?Ophthalmology-NA ?Dexa Scan - NA ?Annual Well Visit - NA ?Micro albumin-NA ?Hemoglobin A1c- 08/28/21 ? ? ar Rating Drugs: ?Lisinopril 20 mg-last fill 01/11/22 90 ds ? ? Nathan Frazier ?Clinical Pharmacist Assistant ?[phone]  ?

## 2022-04-11 ENCOUNTER — Ambulatory Visit (INDEPENDENT_AMBULATORY_CARE_PROVIDER_SITE_OTHER): Payer: Medicare Other

## 2022-04-11 DIAGNOSIS — E1169 Type 2 diabetes mellitus with other specified complication: Secondary | ICD-10-CM

## 2022-04-11 DIAGNOSIS — E118 Type 2 diabetes mellitus with unspecified complications: Secondary | ICD-10-CM

## 2022-04-11 DIAGNOSIS — I10 Essential (primary) hypertension: Secondary | ICD-10-CM

## 2022-04-11 NOTE — Progress Notes (Signed)
? ?Chronic Care Management ?Pharmacy Note ? ?04/11/2022 ?Name:  Nathan Frazier MRN:  [id number] DOB:  1951/11/14 ? ?Summary: ?-Per patient, reports compliance to current medications, notes that ozempic and synjardy are expensive, discussed patient assistance, but patient is over the income limit and would not qualify  ?-Otherwise denies any issues with medications  ? ?Recommendations/Changes made from today's visit: ?-Recommending no changes to current medications, advised patient to reach out should medication affordability become an issue for patient ?-Recommending repeat lipid panel with next endocrinology visit - consider increase in atorvastatin to 83m daily should LDL remain elevated from goal ?-Advised for patient to receive shingles and pneumonia vaccines with next visit to pharmacy  ? ? ?Subjective: ?Nathan VIETSis an 71y.o. year old male male who is a primary patient of CHoyt Koch MD.  The CCM team was consulted for assistance with disease management and care coordination needs.   ? ?Engaged with patient by telephone for follow up visit in response to provider referral for pharmacy case management and/or care coordination services.  ? ?Consent to Services:  ?The patient was given information about Chronic Care Management services, agreed to services, and gave verbal consent prior to initiation of services.  Please see initial visit note for detailed documentation.  ? ?Patient Care Team: ?CHoyt Koch MD as PCP - General (Internal Medicine) ?[place] HNira Conn [facility](Optometry) ?[facility]) ? ?Recent office visits: ?None since last visit  ? ?Recent consult visits: ?11/29/2021 - [facility] referred to podiatry - counseled on adherence to statin therapy  ? ?Hospital visits: ?None in previous 6 months ? ? ?Objective: ? ?Lab Results  ?Component Value Date  ? CREATININE 1.16 11/20/2018  ? BUN 21 11/20/2018  ? GFR 66.22 07/26/2015  ? GFRNONAA >60  11/20/2018  ? GFRAA >60 11/20/2018  ? NA 139 11/20/2018  ? K 4.4 11/20/2018  ? CALCIUM 8.7 (L) 11/20/2018  ? CO2 25 11/20/2018  ? GLUCOSE 189 (H) 11/20/2018  ? ? ?Lab Results  ?Component Value Date/Time  ? HGBA1C 6.4 (H) 11/12/2018 09:44 AM  ? HGBA1C 6.9 12/10/2017 12:00 AM  ? HGBA1C 6.6 09/11/2017 12:00 AM  ? GFR 66.22 07/26/2015 10:55 AM  ? GFR 65.73 10/16/2014 01:42 PM  ? MICROALBUR 40.0 (H) 07/26/2015 10:55 AM  ? MICROALBUR 84.5 (H) 06/29/2013 08:53 AM  ?  ?Last diabetic Eye exam:  ?Lab Results  ?Component Value Date/Time  ? HMDIABEYEEXA No Retinopathy 12/10/2021 04:36 PM  ?  ?Last diabetic Foot exam:  ?Lab Results  ?Component Value Date/Time  ? HMDIABFOOTEX yes 03/26/2010 12:00 AM  ?  ? ?Lab Results  ?Component Value Date  ? CHOL 185 12/10/2017  ? HDL 36 12/10/2017  ? LTool111 12/10/2017  ? LDLDIRECT 143.3 07/20/2014  ? TRIG 192 (A) 12/10/2017  ? CHOLHDL 8 07/20/2014  ? ? ? ?  Latest Ref Rng & Units 12/10/2017  ? 12:00 AM 09/11/2017  ? 12:00 AM 07/26/2015  ? 10:55 AM  ?Hepatic Function  ?Total Protein 6.0 - 8.3 g/dL   7.2    ?Albumin 3.5 - 5.2 g/dL   4.1    ?AST 14 - 40 _0 ?ALT 10 - 40 _1 ?Alk Phosphatase 25 - 125 58      63  82    ?Total Bilirubin 0.2 - 1.2 mg/dL   0.6    ?  ? This result is from an external source.  ? ? ?Lab Results  ?Component Value Date/Time  ? TSH 4.80 07/24/2011 08:16 AM  ? TSH 2.48 03/21/2010 08:51 AM  ? ? ? ?  Latest Ref Rng & Units 11/20/2018  ?  3:50 AM 11/12/2018  ?  9:44 AM 07/24/2011  ?  8:16 AM  ?CBC  ?WBC 4.0 - 10.5 K/uL 12.2   5.4   6.9    ?Hemoglobin 13.0 - 17.0 g/dL 14.7   17.1   14.8    ?Hematocrit 39.0 - 52.0 % 45.4   52.1   44.8    ?Platelets 150 - 400 K/uL 145   148   194.0    ? ? ?No results found for: VD25OH ? ?Clinical ASCVD: No  ?The 10-year ASCVD risk score (Arnett DK, et al., 2019) is: 41.9% ?  Values used to calculate the score: ?    Age: 71 years ?    Sex: Male ?    Is Non-Hispanic African American: No ?    Diabetic: Yes ?     Tobacco smoker: No ?    Systolic Blood Pressure: 536 mmHg ?    Is BP treated: Yes ?    HDL Cholesterol: 40 MG/DL ?    Total Cholesterol: 155 MG/DL   ? ? ?  08/10/2020  ?  1:21 PM 11/13/2017  ?  4:21 PM 09/11/2016  ?  3:31 PM  ?Depression screen PHQ 2/9  ?Decreased Interest 0 0 0  ?Down, Depressed, Hopeless 0 0 0  ?PHQ - 2 Score 0 0 0  ?Altered sleeping  0   ?Tired, decreased energy  0   ?Change in appetite  0   ?Feeling bad or failure about yourself   0   ?Trouble concentrating  0   ?Moving slowly or fidgety/restless  0   ?Suicidal thoughts  0   ?PHQ-9 Score  0   ?Difficult doing work/chores  Not difficult at all   ?  ? ? ?Social History  ? ?Tobacco Use  ?Smoking Status Never  ?Smokeless Tobacco Never  ? ?BP Readings from Last 3 Encounters:  ?08/05/21 128/64  ?08/10/20 (!) 144/86  ?11/18/19 132/83  ? ?Pulse Readings from Last 3 Encounters:  ?08/05/21 60  ?08/10/20 75  ?11/18/19 72  ? ?Wt Readings from Last 3 Encounters:  ?08/05/21 174 lb (78.9 kg)  ?08/10/20 179 lb (81.2 kg)  ?11/18/19 172 lb (78 kg)  ? ?BMI Readings from Last 3 Encounters:  ?08/05/21 26.46 kg/m?  ?08/10/20 27.22 kg/m?  ?11/18/19 26.15 kg/m?  ? ? ?Assessment/Interventions: Review of patient past medical history, allergies, medications, health status, including review of consultants reports, laboratory and other test data, was performed as part of comprehensive evaluation and provision of chronic care management services.  ? ?SDOH:  (Social Determinants of Health) assessments and interventions performed: Yes ? ?SDOH Screenings  ? ?Alcohol Screen: Not on file  ?Depression (PHQ2-9): Not on file  ?Financial Resource Strain: Not on file  ?Food Insecurity: Not on file  ?Housing: Not on file  ?Physical Activity: Not on file  ?Social Connections: Not on file  ?Stress: Not on file  ?Tobacco Use: Low Risk   ? Smoking Tobacco Use: Never  ? Smokeless Tobacco Use: Never  ? Passive Exposure: Not on file  ?Transportation Needs: Not on file  ? ? ?[place] ? ?  Allergies  ?Allergen Reactions  ? Metformin And Related   ?  heartburn  ? Pravastatin   ?  confusion  ? ? ?Medications Reviewed Today   ? ? Reviewed by Tomasa Blase, [facility] (Pharmacist) on 04/11/22 at 1305  Med List Status: <None>  ? ?Medication Order Taking? Sig Documenting Provider Last Dose Status Informant  ?acetaminophen (TYLENOL) 500 MG tablet [id number] Yes Take 1,000 mg by mouth daily as needed for moderate pain or headache. [provider] Taking Active Self  ?aspirin 81 MG chewable tablet [id number] Yes Chew 1 tablet (81 mg total) by mouth 2 (two) times daily.  ?Patient taking differently: Chew 81 mg by mouth once.  ? Mcarthur Rossetti, MD Taking Active   ?atorvastatin (LIPITOR) 40 MG tablet [id number] Yes Take 40 mg by mouth daily. [provider] Taking Active Self  ?b complex vitamins tablet [id number] Yes Take 1 tablet by mouth daily. [provider] Taking Active Self  ?Emollient (CETAPHIL) cream [id number] Yes Apply 1 application topically as needed (dry skin). [provider] Taking Active Self  ?Empagliflozin-metFORMIN HCl ER (SYNJARDY XR) 12.04-999 MG TB24 [id number] Yes Take 2 tablets by mouth daily. [provider] Taking Active Self  ?lisinopril (ZESTRIL) 20 MG tablet [id number] Yes Take 1 tablet (20 mg total) by mouth daily. Hoyt Koch, MD Taking Active   ?methocarbamol (ROBAXIN) 500 MG tablet [id number] Yes Take 1 tablet (500 mg total) by mouth every 6 (six) hours as needed for muscle spasms. Mcarthur Rossetti, MD Taking Active   ?Multiple Vitamin (MULTIVITAMIN WITH MINERALS) TABS tablet [id number] Yes Take 1 tablet by mouth daily. [provider] Taking Active Self  ?pioglitazone (ACTOS) 15 MG tablet [id number] Yes Take 15 mg by mouth daily. [provider] Taking Active Self  ?Polyethyl Glycol-Propyl Glycol (SYSTANE OP) [id number] Yes Place 1 drop into both eyes daily as needed (burning / irritation).  [provider] Taking Active Self  ?Propylhexedrine [facility] NA) [id number] Yes Place 1 Dose into both nostrils daily as needed (congestion). [provider] Taking Active Self  ?Semaglutide,0.25 or

## 2022-04-11 NOTE — Patient Instructions (Signed)
Visit Information ? ?Following are the goals we discussed today:  ? ?Manage My Medicine  ? ?Timeframe:  Long-Range Goal ?Priority:  High ?Start Date:   10/10/21                          ?Expected End Date:  04/12/2023                 ? ?Follow Up Date 09/2022 ?  ?- call for medicine refill 2 or 3 days before it runs out ?- call if I am sick and can't take my medicine ?- keep a list of all the medicines I take; vitamins and herbals too ?- use a pillbox to sort medicine  ?  ?Why is this important?   ?These steps will help you keep on track with your medicines. ? ?Plan: Telephone follow up appointment with care management team member scheduled for:  6 months ?The patient has been provided with contact information for the care management team and has been advised to call with any health related questions or concerns.  ? ?Tomasa Blase, PharmD ?Clinical Pharmacist, [place]  ? ?Please call the care guide team at [phone] if you need to cancel or reschedule your appointment.  ? ?Patient verbalizes understanding of instructions and care plan provided today and agrees to view in [place]. Active MyChart status confirmed with patient.   ? ?

## 2022-04-27 DIAGNOSIS — E785 Hyperlipidemia, unspecified: Secondary | ICD-10-CM

## 2022-04-27 DIAGNOSIS — E1169 Type 2 diabetes mellitus with other specified complication: Secondary | ICD-10-CM | POA: Diagnosis not present

## 2022-04-27 DIAGNOSIS — I10 Essential (primary) hypertension: Secondary | ICD-10-CM | POA: Diagnosis not present

## 2022-04-27 DIAGNOSIS — E118 Type 2 diabetes mellitus with unspecified complications: Secondary | ICD-10-CM

## 2022-05-06 ENCOUNTER — Ambulatory Visit (INDEPENDENT_AMBULATORY_CARE_PROVIDER_SITE_OTHER): Payer: Medicare Other | Admitting: Orthopaedic Surgery

## 2022-05-06 ENCOUNTER — Ambulatory Visit (INDEPENDENT_AMBULATORY_CARE_PROVIDER_SITE_OTHER): Payer: Medicare Other

## 2022-05-06 DIAGNOSIS — M79605 Pain in left leg: Secondary | ICD-10-CM

## 2022-05-06 DIAGNOSIS — N182 Chronic kidney disease, stage 2 (mild): Secondary | ICD-10-CM | POA: Diagnosis not present

## 2022-05-06 DIAGNOSIS — Z96642 Presence of left artificial hip joint: Secondary | ICD-10-CM

## 2022-05-06 DIAGNOSIS — E1169 Type 2 diabetes mellitus with other specified complication: Secondary | ICD-10-CM | POA: Diagnosis not present

## 2022-05-06 DIAGNOSIS — I129 Hypertensive chronic kidney disease with stage 1 through stage 4 chronic kidney disease, or unspecified chronic kidney disease: Secondary | ICD-10-CM | POA: Diagnosis not present

## 2022-05-06 DIAGNOSIS — E1129 Type 2 diabetes mellitus with other diabetic kidney complication: Secondary | ICD-10-CM | POA: Diagnosis not present

## 2022-05-06 DIAGNOSIS — E1165 Type 2 diabetes mellitus with hyperglycemia: Secondary | ICD-10-CM | POA: Diagnosis not present

## 2022-05-06 DIAGNOSIS — Z7984 Long term (current) use of oral hypoglycemic drugs: Secondary | ICD-10-CM | POA: Diagnosis not present

## 2022-05-06 DIAGNOSIS — E1142 Type 2 diabetes mellitus with diabetic polyneuropathy: Secondary | ICD-10-CM | POA: Diagnosis not present

## 2022-05-06 DIAGNOSIS — Z7985 Long-term (current) use of injectable non-insulin antidiabetic drugs: Secondary | ICD-10-CM | POA: Diagnosis not present

## 2022-05-06 DIAGNOSIS — M5442 Lumbago with sciatica, left side: Secondary | ICD-10-CM | POA: Diagnosis not present

## 2022-05-06 DIAGNOSIS — E785 Hyperlipidemia, unspecified: Secondary | ICD-10-CM | POA: Diagnosis not present

## 2022-05-06 DIAGNOSIS — R809 Proteinuria, unspecified: Secondary | ICD-10-CM | POA: Diagnosis not present

## 2022-05-06 DIAGNOSIS — E1122 Type 2 diabetes mellitus with diabetic chronic kidney disease: Secondary | ICD-10-CM | POA: Diagnosis not present

## 2022-05-06 MED ORDER — GABAPENTIN 300 MG PO CAPS: 300.0000 mg | ORAL_CAPSULE | Freq: Every day | ORAL | 1 refills | Status: AC

## 2022-05-06 NOTE — Addendum Note (Signed)
Addended by: Robyne Peers on: 05/06/2022 11:39 AM ? ? Modules accepted: Orders ? ?

## 2022-05-06 NOTE — Progress Notes (Signed)
The patient comes in today with significant radicular symptoms in pain is radiating down his left leg to his foot.  He is a diabetic but has had excellent control.  This has been getting worse for over a month now.  He has weakness in his leg and he says hard to pick up that leg now.  We did replace his left hip a few years ago and has not had any issues.  Denies any groin pain.  He has had trouble getting sleep at night as well. ? ?On exam he does have a positive straight leg raise on the left side.  There is no pain over the trochanteric area of the IT band on the left side.  His hip moves smoothly and fluidly.  There is some weakness in his quads and weakness in his foot on the left side and not on the right side.  He does have limited flexion extension of his lumbar spine. ? 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 ? ?Given his significant weakness and radicular symptoms, we need to obtain an MRI of the lumbar spine to rule out nerve compression.  I will start him on Neurontin 300 mg to take at bedtime for 5 to 6 days and if he is tolerating that he can increase it to twice a day.  Fortunately has no change in bowel or bladder function.  We will see him back in follow-up after we have this MRI.  All questions and concerns were answered and addressed. ?

## 2022-05-12 ENCOUNTER — Telehealth: Payer: Self-pay | Admitting: Orthopaedic Surgery

## 2022-05-12 NOTE — Telephone Encounter (Signed)
Called patient to schedule MRI review. Patient says he has some questions about his medication. Would like a call back. [phone] ?

## 2022-05-20 ENCOUNTER — Ambulatory Visit
Admission: RE | Admit: 2022-05-20 | Discharge: 2022-05-20 | Disposition: A | Discharge status: Home / Self Care | Payer: Medicare Other | Source: Ambulatory Visit | Attending: Orthopaedic Surgery | Admitting: Orthopaedic Surgery

## 2022-05-20 DIAGNOSIS — M47816 Spondylosis without myelopathy or radiculopathy, lumbar region: Secondary | ICD-10-CM | POA: Diagnosis not present

## 2022-05-20 DIAGNOSIS — M5442 Lumbago with sciatica, left side: Secondary | ICD-10-CM

## 2022-05-20 DIAGNOSIS — M79605 Pain in left leg: Secondary | ICD-10-CM

## 2022-05-20 DIAGNOSIS — M545 Low back pain, unspecified: Secondary | ICD-10-CM | POA: Diagnosis not present

## 2022-06-02 ENCOUNTER — Ambulatory Visit (INDEPENDENT_AMBULATORY_CARE_PROVIDER_SITE_OTHER): Payer: Medicare Other | Admitting: Orthopaedic Surgery

## 2022-06-02 ENCOUNTER — Encounter: Payer: Self-pay | Admitting: Orthopaedic Surgery

## 2022-06-02 ENCOUNTER — Other Ambulatory Visit: Payer: Self-pay

## 2022-06-02 ENCOUNTER — Telehealth: Payer: Self-pay

## 2022-06-02 DIAGNOSIS — Z96642 Presence of left artificial hip joint: Secondary | ICD-10-CM

## 2022-06-02 DIAGNOSIS — M5442 Lumbago with sciatica, left side: Secondary | ICD-10-CM

## 2022-06-02 DIAGNOSIS — M79605 Pain in left leg: Secondary | ICD-10-CM

## 2022-06-02 DIAGNOSIS — N2889 Other specified disorders of kidney and ureter: Secondary | ICD-10-CM

## 2022-06-02 NOTE — Progress Notes (Signed)
The patient comes in to go over a MRI of his lumbar spine.  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  I replaced his left hip about 3 years ago.  He then recently had a coughing fit and significant radicular pain going down his left leg.  That is improved with time.  I did review the MRI with him of his lumbar spine.  It shows some mild arthritic changes and disc bulging throughout the lumbar spine but no foraminal stenosis or central stenosis.  There is no acute findings or evidence of nerve compression.  However the radiologist can see cystic changes and masses in both kidneys.  The recommendation is CT scan with and without contrast to further evaluate these masses.  He is denied any urologic symptoms.  He does have occasional flank pain.  He has been able to golf.  On my exam today his left operative hip moves smoothly and fluidly.  He has a negative straight leg raise and excellent strength in both his legs.  Again he played golf recently.  He understands that we do need to work-up the masses that are seen within the kidneys.  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  We will order the CT scan and can let him know what those results are in terms of what other follow-up is needed outside of orthopedics.  For orthopedic standpoint, follow-up for his hip and his back is as needed.

## 2022-06-02 NOTE — Progress Notes (Signed)
Chronic Care Management Pharmacy Assistant   Name: Nathan Frazier  MRN: [id number] DOB: Sep 27, 1951  Reason for Encounter: Disease State-General    Recent office visits:  None since last coordination call 04/11/22  Recent consult visits:  05/06/22 Lanney Gins, MD-Endocrinology  (Diabetes mellitus type 2 with hyperglycemia) [place]  05/06/22 Mcarthur Rossetti, MD-Orthopedic Surgery (Left hip pain) Orders: MR Lumbar Spine w/o contrast, XR HIP Unilat W or W/O Pelvis 1V Left, XR Lumbar Spine 2-3 views; Medication changes: Gabapentin  300 mg daily  Hospital visits:  None since last coordination call 04/11/22  Medications: Outpatient Encounter Medications as of 06/02/2022  Medication Sig   acetaminophen (TYLENOL) 500 MG tablet Take 1,000 mg by mouth daily as needed for moderate pain or headache.   aspirin 81 MG chewable tablet Chew 1 tablet (81 mg total) by mouth 2 (two) times daily. (Patient taking differently: Chew 81 mg by mouth once.)   atorvastatin (LIPITOR) 40 MG tablet Take 40 mg by mouth daily.   b complex vitamins tablet Take 1 tablet by mouth daily.   Emollient (CETAPHIL) cream Apply 1 application topically as needed (dry skin).   Empagliflozin-metFORMIN HCl ER (SYNJARDY XR) 12.04-999 MG TB24 Take 2 tablets by mouth daily.   gabapentin (NEURONTIN) 300 MG capsule Take 1 capsule (300 mg total) by mouth at bedtime.   lisinopril (ZESTRIL) 20 MG tablet Take 1 tablet (20 mg total) by mouth daily.   methocarbamol (ROBAXIN) 500 MG tablet Take 1 tablet (500 mg total) by mouth every 6 (six) hours as needed for muscle spasms.   Multiple Vitamin (MULTIVITAMIN WITH MINERALS) TABS tablet Take 1 tablet by mouth daily.   pioglitazone (ACTOS) 15 MG tablet Take 15 mg by mouth daily.   Polyethyl Glycol-Propyl Glycol (SYSTANE OP) Place 1 drop into both eyes daily as needed (burning / irritation).   Propylhexedrine (BENZEDREX NA) Place 1 Dose into both nostrils  daily as needed (congestion).   Semaglutide,0.25 or 0.'5MG'$ /DOS, (OZEMPIC, 0.25 OR 0.5 MG/DOSE,) 2 MG/1.5ML SOPN Inject 0.5 mg into the skin once a week.   triamcinolone cream (KENALOG) 0.1 % APPLY TO AFFECTED AREA TWICE A DAY   No facility-administered encounter medications on file as of 06/02/2022.   Contacted Nathan Frazier for General Review Call   Chart Review:  Have there been any documented new, changed, or discontinued medications since last visit? Yes (If yes, include name, dose, frequency, date)05/06/22 Mcarthur Rossetti, MD-Orthopedic Surgery - Gabapentin 300 mg daily Has there been any documented recent hospitalizations or ED visits since last visit with Clinical Pharmacist? No Brief Summary (including medication and/or Diagnosis changes):   Adherence Review:  Does the Clinical Pharmacist Assistant have access to adherence rates? Yes Adherence rates for STAR metric medications (List medication(s)/day supply/ last 2 fill dates). Adherence rates for medications indicated for disease state being reviewed (List medication(s)/day supply/ last 2 fill dates). Does the patient have >5 day gap between last estimated fill dates for any of the above medications or other medication gaps? No Reason for medication gaps.   Disease State Questions:  Able to connect with Patient? Yes  Did patient have any problems with their health recently? No Note problems and Concerns:  Have you had any admissions or emergency room visits or worsening of your condition(s) since last visit? No  Have you had any visits with new specialists or providers since your last visit? No Explain:  Have you had any new health care  problem(s) since your last visit? No New problem(s) reported:  Have you run out of any of your medications since you last spoke with clinical pharmacist? Yes What caused you to run out of your medications?Patient states that he is out of test strips and need a new rx sent to the  pharmacy  Are there any medications you are not taking as prescribed? No What kept you from taking your medications as prescribed?  Are you having any issues or side effects with your medications? No Note of issues or side effects:  Do you have any other health concerns or questions you want to discuss with your Clinical Pharmacist before your next visit? No Note additional concerns and questions from Patient.  Are there any health concerns that you feel we can do a better job addressing? No Note Patient's response.  Are you having any problems with any of the following since the last visit: (select all that apply)  None  Details:  12. Any falls since last visit? No  Details:  13. Any increased or uncontrolled pain since last visit? No  Details:  14. Next visit Type: None scheduled         15. Additional Details? No    Care Gaps: Colonoscopy-11/18/19 Diabetic Foot Exam-08/05/21 Ophthalmology-12/10/21 Dexa Scan - NA Annual Well Visit - NA Micro albumin-NA Hemoglobin A1c- 05/06/22    Star Rating Drugs: Lisinopril 20 mg-last fill 04/20/22 90 ds    [place] Pharmacist Assistant [phone]

## 2022-06-26 ENCOUNTER — Ambulatory Visit
Admission: RE | Admit: 2022-06-26 | Discharge: 2022-06-26 | Disposition: A | Discharge status: Home / Self Care | Payer: Medicare Other | Source: Ambulatory Visit | Attending: Orthopaedic Surgery | Admitting: Orthopaedic Surgery

## 2022-06-26 DIAGNOSIS — Q433 Congenital malformations of intestinal fixation: Secondary | ICD-10-CM | POA: Diagnosis not present

## 2022-06-26 DIAGNOSIS — K8020 Calculus of gallbladder without cholecystitis without obstruction: Secondary | ICD-10-CM | POA: Diagnosis not present

## 2022-06-26 DIAGNOSIS — N2889 Other specified disorders of kidney and ureter: Secondary | ICD-10-CM | POA: Diagnosis not present

## 2022-06-26 DIAGNOSIS — D7389 Other diseases of spleen: Secondary | ICD-10-CM | POA: Diagnosis not present

## 2022-06-26 MED ORDER — IOPAMIDOL (ISOVUE-300) INJECTION 61%
100.0000 mL | Freq: Once | INTRAVENOUS | Status: AC | PRN
  Administered 2022-06-26: 100 mL via INTRAVENOUS

## 2022-08-14 ENCOUNTER — Telehealth: Payer: Self-pay

## 2022-08-14 NOTE — Telephone Encounter (Signed)
Pt is calling regarding his CT results that he had  06/26/22. Pt has yet to get a call with his results. Pt states he is concerned with the results.   Please advise

## 2022-08-14 NOTE — Telephone Encounter (Signed)
I do not see anything urgent. I always recommend patients to follow up with provider who ordered imaging if they have not follow up with them about the results. Schedule visit if he wishes to discuss in detail with PCP.

## 2022-08-18 NOTE — Telephone Encounter (Signed)
Called pt and relayed below information. Pt states he will f/u with Dr. Ninfa Linden since he ordered it.

## 2022-08-24 ENCOUNTER — Other Ambulatory Visit: Payer: Self-pay | Admitting: Internal Medicine

## 2022-08-24 DIAGNOSIS — I10 Essential (primary) hypertension: Secondary | ICD-10-CM

## 2022-08-29 ENCOUNTER — Ambulatory Visit (INDEPENDENT_AMBULATORY_CARE_PROVIDER_SITE_OTHER): Payer: Medicare Other

## 2022-08-29 VITALS — Ht 68.0 in | Wt 169.0 lb

## 2022-08-29 DIAGNOSIS — Z0000 Encounter for general adult medical examination without abnormal findings: Secondary | ICD-10-CM

## 2022-08-29 NOTE — Patient Instructions (Signed)
Nathan Frazier , Thank you for taking time to come for your Medicare Wellness Visit. I appreciate your ongoing commitment to your health goals. Please review the following plan we discussed and let me know if I can assist you in the future.   Screening recommendations/referrals: Colonoscopy: 11/18/2019 Recommended yearly ophthalmology/optometry visit for glaucoma screening and checkup Recommended yearly dental visit for hygiene and checkup  Vaccinations: Influenza vaccine: completed  Pneumococcal vaccine: completed  Tdap vaccine: 02/03/2013 Shingles vaccine: completed    Covid-19: completed   Advanced directives: yes   Conditions/risks identified: Aim for 30 minutes of exercise or brisk walking, 6-8 glasses of water, and 5 servings of fruits and vegetables each day.   Next appointment: Follow up in one year for your annual wellness visit.   Preventive Care 36 Years and Older, Male  Preventive care refers to lifestyle choices and visits with your health care provider that can promote health and wellness. What does preventive care include? A yearly physical exam. This is also called an annual well check. Dental exams once or twice a year. Routine eye exams. Ask your health care provider how often you should have your eyes checked. Personal lifestyle choices, including: Daily care of your teeth and gums. Regular physical activity. Eating a healthy diet. Avoiding tobacco and drug use. Limiting alcohol use. Practicing safe sex. Taking low doses of aspirin every day. Taking vitamin and mineral supplements as recommended by your health care provider. What happens during an annual well check? The services and screenings done by your health care provider during your annual well check will depend on your age, overall health, lifestyle risk factors, and family history of disease. Counseling  Your health care provider may ask you questions about your: Alcohol use. Tobacco use. Drug  use. Emotional well-being. Home and relationship well-being. Sexual activity. Eating habits. History of falls. Memory and ability to understand (cognition). Work and work Statistician. Screening  You may have the following tests or measurements: Height, weight, and BMI. Blood pressure. Lipid and cholesterol levels. These may be checked every 5 years, or more frequently if you are over 18 years old. Skin check. Lung cancer screening. You may have this screening every year starting at age 43 if you have a 30-pack-year history of smoking and currently smoke or have quit within the past 15 years. Fecal occult blood test (FOBT) of the stool. You may have this test every year starting at age 36. Flexible sigmoidoscopy or colonoscopy. You may have a sigmoidoscopy every 5 years or a colonoscopy every 10 years starting at age 2. Prostate cancer screening. Recommendations will vary depending on your family history and other risks. Hepatitis C blood test. Hepatitis B blood test. Sexually transmitted disease (STD) testing. Diabetes screening. This is done by checking your blood sugar (glucose) after you have not eaten for a while (fasting). You may have this done every 1-3 years. Abdominal aortic aneurysm (AAA) screening. You may need this if you are a current or former smoker. Osteoporosis. You may be screened starting at age 29 if you are at high risk. Talk with your health care provider about your test results, treatment options, and if necessary, the need for more tests. Vaccines  Your health care provider may recommend certain vaccines, such as: Influenza vaccine. This is recommended every year. Tetanus, diphtheria, and acellular pertussis (Tdap, Td) vaccine. You may need a Td booster every 10 years. Zoster vaccine. You may need this after age 39. Pneumococcal 13-valent conjugate (PCV13) vaccine. One dose  is recommended after age 48. Pneumococcal polysaccharide (PPSV23) vaccine. One dose is  recommended after age 85. Talk to your health care provider about which screenings and vaccines you need and how often you need them. This information is not intended to replace advice given to you by your health care provider. Make sure you discuss any questions you have with your health care provider. Document Released: 01/11/2016 Document Revised: 09/03/2016 Document Reviewed: 10/16/2015 Elsevier Interactive Patient Education  2017 [employer] Prevention in the Home Falls can cause injuries. They can happen to people of all ages. There are many things you can do to make your home safe and to help prevent falls. What can I do on the outside of my home? Regularly fix the edges of walkways and driveways and fix any cracks. Remove anything that might make you trip as you walk through a door, such as a raised step or threshold. Trim any bushes or trees on the path to your home. Use bright outdoor lighting. Clear any walking paths of anything that might make someone trip, such as rocks or tools. Regularly check to see if handrails are loose or broken. Make sure that both sides of any steps have handrails. Any raised decks and porches should have guardrails on the edges. Have any leaves, snow, or ice cleared regularly. Use sand or salt on walking paths during winter. Clean up any spills in your garage right away. This includes oil or grease spills. What can I do in the bathroom? Use night lights. Install grab bars by the toilet and in the tub and shower. Do not use towel bars as grab bars. Use non-skid mats or decals in the tub or shower. If you need to sit down in the shower, use a plastic, non-slip stool. Keep the floor dry. Clean up any water that spills on the floor as soon as it happens. Remove soap buildup in the tub or shower regularly. Attach bath mats securely with double-sided non-slip rug tape. Do not have throw rugs and other things on the floor that can make you  trip. What can I do in the bedroom? Use night lights. Make sure that you have a light by your bed that is easy to reach. Do not use any sheets or blankets that are too big for your bed. They should not hang down onto the floor. Have a firm chair that has side arms. You can use this for support while you get dressed. Do not have throw rugs and other things on the floor that can make you trip. What can I do in the kitchen? Clean up any spills right away. Avoid walking on wet floors. Keep items that you use a lot in easy-to-reach places. If you need to reach something above you, use a strong step stool that has a grab bar. Keep electrical cords out of the way. Do not use floor polish or wax that makes floors slippery. If you must use wax, use non-skid floor wax. Do not have throw rugs and other things on the floor that can make you trip. What can I do with my stairs? Do not leave any items on the stairs. Make sure that there are handrails on both sides of the stairs and use them. Fix handrails that are broken or loose. Make sure that handrails are as long as the stairways. Check any carpeting to make sure that it is firmly attached to the stairs. Fix any carpet that is loose or worn. Avoid  having throw rugs at the top or bottom of the stairs. If you do have throw rugs, attach them to the floor with carpet tape. Make sure that you have a light switch at the top of the stairs and the bottom of the stairs. If you do not have them, ask someone to add them for you. What else can I do to help prevent falls? Wear shoes that: Do not have high heels. Have rubber bottoms. Are comfortable and fit you well. Are closed at the toe. Do not wear sandals. If you use a stepladder: Make sure that it is fully opened. Do not climb a closed stepladder. Make sure that both sides of the stepladder are locked into place. Ask someone to hold it for you, if possible. Clearly mark and make sure that you can  see: Any grab bars or handrails. First and last steps. Where the edge of each step is. Use tools that help you move around (mobility aids) if they are needed. These include: Canes. Walkers. Scooters. Crutches. Turn on the lights when you go into a dark area. Replace any light bulbs as soon as they burn out. Set up your furniture so you have a clear path. Avoid moving your furniture around. If any of your floors are uneven, fix them. If there are any pets around you, be aware of where they are. Review your medicines with your doctor. Some medicines can make you feel dizzy. This can increase your chance of falling. Ask your doctor what other things that you can do to help prevent falls. This information is not intended to replace advice given to you by your health care provider. Make sure you discuss any questions you have with your health care provider. Document Released: 10/11/2009 Document Revised: 05/22/2016 Document Reviewed: 01/19/2015 Elsevier Interactive Patient Education  2017 [employer].

## 2022-08-29 NOTE — Progress Notes (Signed)
Subjective:   Nathan Frazier is a 71 y.o. male who presents for Medicare Annual/Subsequent preventive examination.   Virtual Visit via Telephone Note  I connected with  Nathan Frazier on 08/29/22 at 11:30 AM EDT by telephone and verified that I am speaking with the correct person using two identifiers.  Location: Patient: Home  Provider: Esmond Plants  Persons participating in the virtual visit: patient/Nurse Health Advisor   I discussed the limitations, risks, security and privacy concerns of performing an evaluation and management service by telephone and the availability of in person appointments. The patient expressed understanding and agreed to proceed.  Interactive audio and video telecommunications were attempted between this nurse and patient, however failed, due to patient having technical difficulties OR patient did not have access to video capability.  We continued and completed visit with audio only.  Some vital signs may be absent or patient reported.   Daphane Shepherd, LPN  Review of Systems     Cardiac Risk Factors include: advanced age (>4mn, >>17women);male gender     Objective:    Today's Vitals   08/29/22 1143  Weight: 169 lb (76.7 kg)  Height: '5\' 8"'$  (1.727 m)   Body mass index is 25.7 kg/m.     08/29/2022   11:51 AM 11/19/2018    5:40 PM 11/12/2018    9:16 AM 11/13/2017    4:21 PM 09/15/2014   10:33 AM  Advanced Directives  Does Patient Have a Medical Advance Directive? Yes No No No No  Type of [place]      [place]? No - copy requested      Would patient like information on creating a medical advance directive?  No - Patient declined No - Patient declined No - Patient declined     Current Medications (verified) Outpatient Encounter Medications as of 08/29/2022  Medication Sig   acetaminophen (TYLENOL) 500 MG tablet Take 1,000 mg by mouth daily as needed for  moderate pain or headache.   aspirin 81 MG chewable tablet Chew 1 tablet (81 mg total) by mouth 2 (two) times daily. (Patient taking differently: Chew 81 mg by mouth once.)   atorvastatin (LIPITOR) 40 MG tablet Take 40 mg by mouth daily.   b complex vitamins tablet Take 1 tablet by mouth daily.   Emollient (CETAPHIL) cream Apply 1 application topically as needed (dry skin).   Empagliflozin-metFORMIN HCl ER (SYNJARDY XR) 12.04-999 MG TB24 Take 2 tablets by mouth daily.   gabapentin (NEURONTIN) 300 MG capsule Take 1 capsule (300 mg total) by mouth at bedtime.   lisinopril (ZESTRIL) 20 MG tablet Take 1 tablet (20 mg total) by mouth daily.   methocarbamol (ROBAXIN) 500 MG tablet Take 1 tablet (500 mg total) by mouth every 6 (six) hours as needed for muscle spasms.   Multiple Vitamin (MULTIVITAMIN WITH MINERALS) TABS tablet Take 1 tablet by mouth daily.   pioglitazone (ACTOS) 15 MG tablet Take 15 mg by mouth daily.   Polyethyl Glycol-Propyl Glycol (SYSTANE OP) Place 1 drop into both eyes daily as needed (burning / irritation).   Propylhexedrine (BENZEDREX NA) Place 1 Dose into both nostrils daily as needed (congestion).   Semaglutide,0.25 or 0.'5MG'$ /DOS, (OZEMPIC, 0.25 OR 0.5 MG/DOSE,) 2 MG/1.5ML SOPN Inject 0.5 mg into the skin once a week.   triamcinolone cream (KENALOG) 0.1 % APPLY TO AFFECTED AREA TWICE A DAY   No facility-administered encounter medications on file  as of 08/29/2022.    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   Procedure: LEFT TOTAL HIP ARTHROPLASTY ANTERIOR APPROACH;  Surgeon: Mcarthur Rossetti, MD;  Location: [facility];  Service: Orthopedics;  Laterality: Left;  Failed Spinal   Family History  Problem Relation Age of Onset   Heart disease Maternal Grandfather    Liver cancer Mother    Colon cancer Neg Hx    Esophageal cancer Neg Hx    Rectal cancer Neg Hx    Stomach cancer Neg Hx    Social History   Socioeconomic History   Marital status: Married    Spouse name: Pam Walrond   Number of children: 2   Years of education: Not on file   Highest education level: Not on file  Occupational History   Occupation: Product manager company  Tobacco Use   Smoking status: Never   Smokeless tobacco: Never  Vaping Use   Vaping Use: Never used  Substance and Sexual Activity   Alcohol use: Yes    Alcohol/week: 1.0 standard drink of alcohol    Types: 1 Standard drinks or equivalent per week   Drug use: No   Sexual activity: Yes    Partners: Female  Other Topics Concern   Not on file  Social History Narrative   [place]   Married 1989   Regular exercise: seldom   Caffeine use: coffee daily   Social Determinants of Health   Financial Resource Strain: High Risk (06/22/2020)   Overall Financial Resource Strain (CARDIA)    Difficulty of Paying Living Expenses: Hard  Food Insecurity: No Food Insecurity (08/29/2022)   Hunger Vital Sign    Worried About Running Out of Food in the Last Year: Never true    Ran Out of Food in the Last Year: Never true  Transportation Needs: No Transportation Needs (08/29/2022)   PRAPARE - Hydrologist (Medical): No    Lack of Transportation (Non-Medical): No  Physical Activity: Insufficiently Active (08/29/2022)   Exercise Vital Sign    Days of Exercise per Week: 3 days    Minutes of  Exercise per Session: 30 min  Stress: No Stress Concern Present (08/29/2022)   [place]    Feeling of Stress : Not at all  Social Connections: [place] (08/29/2022)   Social Connection and Isolation Panel [NHANES]    Frequency of Communication with Friends and Family: More than three times a week    Frequency of Social Gatherings with Friends and Family: More than three times a week    Attends Religious Services: More than 4 times per year    Active Member of [employer] or Organizations: Yes    Attends Archivist Meetings: 1 to 4 times per year    Marital Status: Married  Tobacco Counseling Counseling given: Not Answered   Clinical Intake:  Pre-visit preparation completed: Yes  Pain : No/denies pain     Diabetes: No  How often do you need to have someone help you when you read instructions, pamphlets, or other written materials from your doctor or pharmacy?: 1 - Never What is the last grade level you completed in school?: college  Diabetic?yes  Nutrition Risk Assessment:  Has the patient had any N/V/D within the last 2 months?  No  Does the patient have any non-healing wounds?  No  Has the patient had any unintentional weight loss or weight gain?  No   Diabetes:  Is the patient diabetic?  Yes  If diabetic, was a CBG obtained today?  No  Did the patient bring in their glucometer from home?  No  How often do you monitor your CBG's? Twice a day   Financial Strains and Diabetes Management:  Are you having any financial strains with the device, your supplies or your medication? Yes .  Does the patient want to be seen by Chronic Care Management for management of their diabetes?  No  Would the patient like to be referred to a Nutritionist or for Diabetic Management?  No   Diabetic Exams:  Diabetic Eye Exam: Overdue for diabetic eye exam. Pt has been advised about the importance in  completing this exam. Patient advised to call and schedule an eye exam. Diabetic Foot Exam: Overdue, Pt has been advised about the importance in completing this exam. Pt is scheduled for diabetic foot exam on next office visit .   Interpreter Needed?: No  Information entered by :: L.Wilson,LPN   Activities of Daily Living    08/29/2022   11:51 AM  In your present state of health, do you have any difficulty performing the following activities:  Hearing? 0  Vision? 0  Difficulty concentrating or making decisions? 0  Walking or climbing stairs? 0  Dressing or bathing? 0  Doing errands, shopping? 0  Preparing Food and eating ? N  Using the Toilet? N  In the past six months, have you accidently leaked urine? N  Do you have problems with loss of bowel control? N  Managing your Medications? N  Managing your Finances? N  Housekeeping or managing your Housekeeping? N    Patient Care Team: Hoyt Koch, MD as PCP - General (Internal Medicine) [place], Heather, Mina (Optometry) Sz[place], Darnelle Maffucci, [facility] (Inactive) (Pharmacist)  Indicate any recent Medical Services you may have received from other than [facility] providers in the past year (date may be approximate).     Assessment:   This is a routine wellness examination for Nathan Frazier.  Hearing/Vision screen Vision Screening - Comments:: Patient to schedule annual eye exams   Dietary issues and exercise activities discussed: Current Exercise Habits: Home exercise routine, Time (Minutes): 30, Intensity: Mild, Exercise limited by: None identified   Goals Addressed             This Visit's Progress    Patient Stated   On track    Increase my physical activity, start working out at home after work.         Depression Screen    08/29/2022   11:47 AM 08/10/2020    1:21 PM 11/13/2017    4:21 PM 09/11/2016    3:31 PM 07/20/2014    3:37 PM  PHQ 2/9 Scores  PHQ - 2 Score 0 0 0 0 0  PHQ- 9 Score  0      Fall Risk    08/29/2022    11:44 AM 08/05/2021    8:56 AM 11/23/2019   11:02 AM 11/16/2018   12:27 PM 11/13/2017    4:21 PM  Fall Risk   Falls in the past year? 0 0 0 0 Yes  Comment   Emmi Telephone Survey: data to providers prior to load Emmi Telephone Survey: data to providers prior to load   Number falls in past yr: 0 0     Injury with Fall? 0 0     Risk for fall due to : No Fall Risks      Follow up Falls prevention discussed        Roy:  Any stairs in or around the home? Yes  If so, are there any without handrails? No  Home free of loose throw rugs in walkways, pet beds, electrical cords, etc? Yes  Adequate lighting in your home to reduce risk of falls? Yes   ASSISTIVE DEVICES UTILIZED TO PREVENT FALLS:  Life alert? No  Use of a cane, walker or w/c? No  Grab bars in the bathroom? Yes  Shower chair or bench in shower? No  Elevated toilet seat or a handicapped toilet? No       11/13/2017    4:25 PM  MMSE - Mini Mental State Exam  Orientation to time 5  Orientation to Place 5  Registration 3  Attention/ Calculation 5  Recall 2  Language- name 2 objects 2  Language- repeat 1  Language- follow 3 step command 3  Language- read & follow direction 1  Write a sentence 1  Copy design 1  Total score 29        08/29/2022   11:53 AM  6CIT Screen  What Year? 0 points  What month? 0 points  What time? 0 points  Count back from 20 0 points  Months in reverse 0 points  Repeat phrase 0 points  Total Score 0 points    Immunizations Immunization History  Administered Date(s) Administered   Influenza, High Dose Seasonal PF 11/13/2017   Influenza-Unspecified 09/20/2018   Moderna Sars-Covid-2 Vaccination 01/28/2020, 02/25/2020   Pneumococcal Conjugate-13 07/20/2014   Pneumococcal Polysaccharide-23 02/03/2013   Tdap 02/03/2013   Zoster, Live 08/22/2015    TDAP status: Up to date  Flu Vaccine status: Up to date  Pneumococcal vaccine status: Up to  date  Covid-19 vaccine status: Completed vaccines  Qualifies for Shingles Vaccine? Yes   Zostavax completed No   Shingrix Completed?: No.    Education has been provided regarding the importance of this vaccine. Patient has been advised to call insurance company to determine out of pocket expense if they have not yet received this vaccine. Advised may also receive vaccine at local pharmacy or Health Dept. Verbalized acceptance and understanding.  Screening Tests Health Maintenance  Topic Date Due   Zoster Vaccines- Shingrix (1 of 2) Never done   Pneumonia Vaccine 51+ Years old (3 - PPSV23 or PCV20) 02/03/2018   HEMOGLOBIN A1C  05/13/2019   Diabetic kidney evaluation - GFR measurement  11/21/2019   COVID-19 Vaccine (3 - Moderna risk series) 03/24/2020   INFLUENZA VACCINE  07/29/2022   Diabetic kidney evaluation - Urine ACR  08/28/2022   FOOT EXAM  08/05/2022   OPHTHALMOLOGY EXAM  12/10/2022   TETANUS/TDAP  02/03/2023   COLONOSCOPY (Pts 45-73yr Insurance coverage will need to be confirmed)  11/17/2024  Hepatitis C Screening  Completed   HPV VACCINES  Aged Out    Health Maintenance  Health Maintenance Due  Topic Date Due   Zoster Vaccines- Shingrix (1 of 2) Never done   Pneumonia Vaccine 60+ Years old (3 - PPSV23 or PCV20) 02/03/2018   HEMOGLOBIN A1C  05/13/2019   Diabetic kidney evaluation - GFR measurement  11/21/2019   COVID-19 Vaccine (3 - Moderna risk series) 03/24/2020   INFLUENZA VACCINE  07/29/2022   Diabetic kidney evaluation - Urine ACR  08/28/2022   FOOT EXAM  08/05/2022    Colorectal cancer screening: Type of screening: Colonoscopy. Completed 11/18/2019. Repeat every 5 years  Lung Cancer Screening: (Low Dose CT Chest recommended if Age 73-80 years, 30 pack-year currently smoking OR have quit w/in 15years.) does not qualify.   Lung Cancer Screening Referral: n/a  Additional Screening:  Hepatitis C Screening: does not qualify;   Vision Screening: Recommended  annual ophthalmology exams for early detection of glaucoma and other disorders of the eye. Is the patient up to date with their annual eye exam?  Yes  Who is the provider or what is the name of the office in which the patient attends annual eye exams? Dr.Orman If pt is not established with a provider, would they like to be referred to a provider to establish care? No .   Dental Screening: Recommended annual dental exams for proper oral hygiene  Community Resource Referral / Chronic Care Management: CRR required this visit?  No   CCM required this visit?  No      Plan:     I have personally reviewed and noted the following in the patient's chart:   Medical and social history Use of alcohol, tobacco or illicit drugs  Current medications and supplements including opioid prescriptions. Patient is not currently taking opioid prescriptions. Functional ability and status Nutritional status Physical activity Advanced directives List of other physicians Hospitalizations, surgeries, and ER visits in previous 12 months Vitals Screenings to include cognitive, depression, and falls Referrals and appointments  In addition, I have reviewed and discussed with patient certain preventive protocols, quality metrics, and best practice recommendations. A written personalized care plan for preventive services as well as general preventive health recommendations were provided to patient.     Daphane Shepherd, LPN   01/29/2247   Nurse Notes: Aim for 30 minutes of exercise or brisk walking, 6-8 glasses of water, and 5 servings of fruits and vegetables each day.

## 2022-09-02 ENCOUNTER — Encounter: Payer: Self-pay | Admitting: Internal Medicine

## 2022-09-08 DIAGNOSIS — R809 Proteinuria, unspecified: Secondary | ICD-10-CM | POA: Diagnosis not present

## 2022-09-08 DIAGNOSIS — E1165 Type 2 diabetes mellitus with hyperglycemia: Secondary | ICD-10-CM | POA: Diagnosis not present

## 2022-09-08 DIAGNOSIS — E1142 Type 2 diabetes mellitus with diabetic polyneuropathy: Secondary | ICD-10-CM | POA: Diagnosis not present

## 2022-09-08 DIAGNOSIS — E782 Mixed hyperlipidemia: Secondary | ICD-10-CM | POA: Diagnosis not present

## 2022-09-09 DIAGNOSIS — N182 Chronic kidney disease, stage 2 (mild): Secondary | ICD-10-CM | POA: Diagnosis not present

## 2022-09-09 DIAGNOSIS — R809 Proteinuria, unspecified: Secondary | ICD-10-CM | POA: Diagnosis not present

## 2022-09-09 DIAGNOSIS — Z7985 Long-term (current) use of injectable non-insulin antidiabetic drugs: Secondary | ICD-10-CM | POA: Diagnosis not present

## 2022-09-09 DIAGNOSIS — S90424D Blister (nonthermal), right lesser toe(s), subsequent encounter: Secondary | ICD-10-CM | POA: Diagnosis not present

## 2022-09-09 DIAGNOSIS — Z7984 Long term (current) use of oral hypoglycemic drugs: Secondary | ICD-10-CM | POA: Diagnosis not present

## 2022-09-09 DIAGNOSIS — E1165 Type 2 diabetes mellitus with hyperglycemia: Secondary | ICD-10-CM | POA: Diagnosis not present

## 2022-09-09 DIAGNOSIS — E1122 Type 2 diabetes mellitus with diabetic chronic kidney disease: Secondary | ICD-10-CM | POA: Diagnosis not present

## 2022-10-09 ENCOUNTER — Telehealth: Payer: Medicare Other

## 2023-01-14 DIAGNOSIS — I129 Hypertensive chronic kidney disease with stage 1 through stage 4 chronic kidney disease, or unspecified chronic kidney disease: Secondary | ICD-10-CM | POA: Diagnosis not present

## 2023-01-14 DIAGNOSIS — E1129 Type 2 diabetes mellitus with other diabetic kidney complication: Secondary | ICD-10-CM | POA: Diagnosis not present

## 2023-01-14 DIAGNOSIS — R809 Proteinuria, unspecified: Secondary | ICD-10-CM | POA: Diagnosis not present

## 2023-01-14 DIAGNOSIS — E1165 Type 2 diabetes mellitus with hyperglycemia: Secondary | ICD-10-CM | POA: Diagnosis not present

## 2023-01-14 DIAGNOSIS — E1142 Type 2 diabetes mellitus with diabetic polyneuropathy: Secondary | ICD-10-CM | POA: Diagnosis not present

## 2023-01-14 DIAGNOSIS — E785 Hyperlipidemia, unspecified: Secondary | ICD-10-CM | POA: Diagnosis not present

## 2023-01-14 DIAGNOSIS — E1122 Type 2 diabetes mellitus with diabetic chronic kidney disease: Secondary | ICD-10-CM | POA: Diagnosis not present

## 2023-01-14 DIAGNOSIS — E1169 Type 2 diabetes mellitus with other specified complication: Secondary | ICD-10-CM | POA: Diagnosis not present

## 2023-01-14 DIAGNOSIS — N182 Chronic kidney disease, stage 2 (mild): Secondary | ICD-10-CM | POA: Diagnosis not present

## 2023-01-16 DIAGNOSIS — Z7984 Long term (current) use of oral hypoglycemic drugs: Secondary | ICD-10-CM | POA: Diagnosis not present

## 2023-01-16 DIAGNOSIS — R238 Other skin changes: Secondary | ICD-10-CM | POA: Diagnosis not present

## 2023-01-16 DIAGNOSIS — I129 Hypertensive chronic kidney disease with stage 1 through stage 4 chronic kidney disease, or unspecified chronic kidney disease: Secondary | ICD-10-CM | POA: Diagnosis not present

## 2023-01-16 DIAGNOSIS — R809 Proteinuria, unspecified: Secondary | ICD-10-CM | POA: Diagnosis not present

## 2023-01-16 DIAGNOSIS — Z7985 Long-term (current) use of injectable non-insulin antidiabetic drugs: Secondary | ICD-10-CM | POA: Diagnosis not present

## 2023-01-16 DIAGNOSIS — E1169 Type 2 diabetes mellitus with other specified complication: Secondary | ICD-10-CM | POA: Diagnosis not present

## 2023-01-16 DIAGNOSIS — E1165 Type 2 diabetes mellitus with hyperglycemia: Secondary | ICD-10-CM | POA: Diagnosis not present

## 2023-01-16 DIAGNOSIS — R251 Tremor, unspecified: Secondary | ICD-10-CM | POA: Diagnosis not present

## 2023-01-16 DIAGNOSIS — N182 Chronic kidney disease, stage 2 (mild): Secondary | ICD-10-CM | POA: Diagnosis not present

## 2023-01-16 DIAGNOSIS — E1122 Type 2 diabetes mellitus with diabetic chronic kidney disease: Secondary | ICD-10-CM | POA: Diagnosis not present

## 2023-02-05 DIAGNOSIS — R251 Tremor, unspecified: Secondary | ICD-10-CM | POA: Diagnosis not present

## 2023-03-26 DIAGNOSIS — E119 Type 2 diabetes mellitus without complications: Secondary | ICD-10-CM | POA: Diagnosis not present

## 2023-03-26 LAB — HM DIABETES EYE EXAM

## 2023-04-01 ENCOUNTER — Encounter: Payer: Self-pay | Admitting: Internal Medicine

## 2023-07-31 ENCOUNTER — Ambulatory Visit (INDEPENDENT_AMBULATORY_CARE_PROVIDER_SITE_OTHER): Payer: Medicare Other

## 2023-07-31 VITALS — Wt 167.0 lb

## 2023-07-31 DIAGNOSIS — Z0000 Encounter for general adult medical examination without abnormal findings: Secondary | ICD-10-CM | POA: Diagnosis not present

## 2023-07-31 NOTE — Progress Notes (Signed)
Subjective:   Nathan Frazier is a 72 y.o. male who presents for Medicare Annual/Subsequent preventive examination.  Visit Complete: Virtual  I connected with  [name] on 07/31/23 by a audio enabled telemedicine application and verified that I am speaking with the correct person using two identifiers.  Patient Location: Home  Provider Location: Home Office  I discussed the limitations of evaluation and management by telemedicine. The patient expressed understanding and agreed to proceed.  Patient Medicare AWV questionnaire was completed by the patient on 07/27/2023; I have confirmed that all information answered by patient is correct and no changes since this date.  Vital Signs: Per patient no change in vitals since last visit.   Review of Systems    Cardiac Risk Factors include: advanced age (>61men, >38 women);male gender;hypertension;diabetes mellitus;dyslipidemia     Objective:    Today's Vitals   07/31/23 1428  Weight: 167 lb (75.8 kg)   Body mass index is 25.39 kg/m.     07/31/2023    2:32 PM 08/29/2022   11:51 AM 11/19/2018    5:40 PM 11/12/2018    9:16 AM 11/13/2017    4:21 PM 09/15/2014   10:33 AM  Advanced Directives  Does Patient Have a Medical Advance Directive? Yes Yes No No No No  Type of Estate agent of [place];Living will Healthcare Power of [place];Living will      Copy of Healthcare Power of Attorney in Chart? No - copy requested No - copy requested      Would patient like information on creating a medical advance directive?   No - Patient declined No - Patient declined No - Patient declined     Current Medications (verified) Outpatient Encounter Medications as of 07/31/2023  Medication Sig   acetaminophen (TYLENOL) 500 MG tablet Take 1,000 mg by mouth daily as needed for moderate pain or headache.   aspirin 81 MG chewable tablet Chew 1 tablet (81 mg total) by mouth 2 (two) times daily. (Patient taking differently: Chew 81  mg by mouth once.)   atorvastatin (LIPITOR) 40 MG tablet Take 40 mg by mouth daily.   b complex vitamins tablet Take 1 tablet by mouth daily.   Emollient (CETAPHIL) cream Apply 1 application topically as needed (dry skin).   Empagliflozin-metFORMIN HCl ER (SYNJARDY XR) 12.04-999 MG TB24 Take 2 tablets by mouth daily.   gabapentin (NEURONTIN) 300 MG capsule Take 1 capsule (300 mg total) by mouth at bedtime.   lisinopril (ZESTRIL) 20 MG tablet Take 1 tablet (20 mg total) by mouth daily. Overdue for Annual appt must see provider for future refills   methocarbamol (ROBAXIN) 500 MG tablet Take 1 tablet (500 mg total) by mouth every 6 (six) hours as needed for muscle spasms.   Multiple Vitamin (MULTIVITAMIN WITH MINERALS) TABS tablet Take 1 tablet by mouth daily.   pioglitazone (ACTOS) 15 MG tablet Take 15 mg by mouth daily.   Polyethyl Glycol-Propyl Glycol (SYSTANE OP) Place 1 drop into both eyes daily as needed (burning / irritation).   Propylhexedrine (BENZEDREX NA) Place 1 Dose into both nostrils daily as needed (congestion).   Semaglutide,0.25 or 0.5MG /DOS, (OZEMPIC, 0.25 OR 0.5 MG/DOSE,) 2 MG/1.5ML SOPN Inject 0.5 mg into the skin once a week.   triamcinolone cream (KENALOG) 0.1 % APPLY TO AFFECTED AREA TWICE A DAY   No facility-administered encounter medications on file as of 07/31/2023.    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   Procedure: LEFT TOTAL HIP ARTHROPLASTY ANTERIOR APPROACH;  Surgeon: Kathryne Hitch, MD;  Location: [facility];  Service: Orthopedics;  Laterality: Left;  Failed Spinal   Family History  Problem Relation Age of Onset   Heart disease Maternal Grandfather    Liver cancer Mother    Colon cancer Neg Hx    Esophageal cancer Neg Hx    Rectal cancer Neg Hx    Stomach cancer Neg Hx    Social History   Socioeconomic History   Marital status: Married    Spouse name: [name]   Number of children: 2   Years of education: Not on file   Highest education level: Not on file  Occupational History   Occupation: Visual merchandiser company  Tobacco Use   Smoking status: Never   Smokeless tobacco: Never  Vaping Use   Vaping status: Never Used  Substance and Sexual Activity   Alcohol use: Yes    Alcohol/week: 1.0 standard drink of alcohol    Types: 1 Standard drinks or equivalent per week   Drug use: No   Sexual activity: Yes    Partners: Female  Other Topics Concern   Not on file  Social History Narrative   [place]   Married 1989   Regular exercise: seldom   Caffeine use: coffee daily   Social Determinants of Health   Financial Resource Strain: Low Risk  (07/31/2023)   Overall Financial Resource Strain (CARDIA)    Difficulty of Paying Living Expenses: Not hard at all  Food Insecurity: No Food Insecurity (07/31/2023)   Hunger Vital Sign    Worried About Running Out of Food in the Last Year: Never true    Ran Out of Food in the Last Year: Never true  Transportation Needs: No Transportation Needs (07/31/2023)   PRAPARE - Administrator, Civil Service (Medical): No    Lack of Transportation (Non-Medical): No  Physical Activity: Insufficiently Active (07/31/2023)   Exercise Vital Sign    Days of Exercise per Week: 2 days    Minutes of Exercise per Session: 30 min  Stress: No Stress Concern Present (07/31/2023)    [employer] of Occupational Health - Occupational Stress Questionnaire    Feeling of Stress : Only a little  Social Connections: Moderately Integrated (07/31/2023)   Social Connection and Isolation Panel [NHANES]    Frequency of Communication with Friends and Family: Never    Frequency of Social Gatherings with Friends and Family: Once a week    Attends Religious Services: More than 4 times per year    Active Member of [employer] or Organizations: Yes    Attends Banker Meetings: 1 to 4 times per year    Marital Status: Married    Tobacco Counseling Counseling given: Not Answered   Clinical Intake:  Pre-visit preparation completed: Yes  Pain : No/denies pain  BMI - recorded: 25.39 Nutritional Risks: None Diabetes: Yes CBG done?: No Did pt. bring in CBG monitor from home?: No  How often do you need to have someone help you when you read instructions, pamphlets, or other written materials from your doctor or pharmacy?: 2 - Rarely         Activities of Daily Living    07/31/2023    2:31 PM 07/27/2023   12:21 PM  In your present state of health, do you have any difficulty performing the following activities:  Hearing? 0 0  Vision? 1 1  Difficulty concentrating or making decisions? 1 1  Walking or climbing stairs? 0 0  Dressing or bathing? 0 0  Doing errands, shopping? 0 0  Preparing Food and eating ? N N  Using the Toilet? N N  In the past six months, have you accidently leaked urine? N N  Do you have problems with loss of bowel control? N N  Managing your Medications? N N  Managing your Finances? N N  Housekeeping or managing your Housekeeping? N N    Patient Care Team: Myrlene Broker, MD as PCP - General (Internal Medicine) [place], Heather, OD (Optometry) Szabat, Vinnie Level, [facility] (Inactive) (Pharmacist)  Indicate any recent Medical Services you may have received from other than [facility] providers in the past year (date may be approximate).      Assessment:   This is a routine wellness examination for [place].  Hearing/Vision screen Hearing Screening - Comments:: Denies hearing difficulties    Dietary issues and exercise activities discussed:     Goals Addressed   None   Depression Screen    07/31/2023    2:33 PM 08/29/2022   11:47 AM 08/10/2020    1:21 PM 11/13/2017    4:21 PM 09/11/2016    3:31 PM 07/20/2014    3:37 PM  PHQ 2/9 Scores  PHQ - 2 Score 0 0 0 0 0 0  PHQ- 9 Score 0   0      Fall Risk    07/31/2023    2:32 PM 07/27/2023   12:21 PM 08/29/2022   11:44 AM 08/05/2021    8:56 AM 11/23/2019   11:02 AM  Fall Risk   Falls in the past year? 0 0 0 0 0  Comment     Emmi Telephone Survey: data to providers prior to load  Number falls in past yr:  0 0 0   Injury with Fall?  0 0 0   Risk for fall due to :   No Fall Risks    Follow up   Falls prevention discussed      MEDICARE RISK AT HOME:   TIMED UP AND GO:  Was the test performed?  No    Cognitive Function:    11/13/2017    4:25 PM  MMSE - Mini Mental State Exam  Orientation to time 5  Orientation to Place 5  Registration 3  Attention/ Calculation 5  Recall 2  Language- name 2 objects 2  Language- repeat 1  Language- follow 3 step command 3  Language- read & follow direction 1  Write a sentence 1  Copy design 1  Total score 29        08/29/2022   11:53 AM  6CIT Screen  What Year? 0 points  What month? 0 points  What time? 0 points  Count back from 20 0 points  Months in reverse 0 points  Repeat phrase 0 points  Total Score 0 points    Immunizations Immunization History  Administered Date(s) Administered   Influenza, High Dose Seasonal PF 11/13/2017   Influenza-Unspecified 09/20/2018   Moderna Sars-Covid-2 Vaccination 01/28/2020, 02/25/2020   Pneumococcal Conjugate-13 07/20/2014   Pneumococcal Polysaccharide-23 02/03/2013   Tdap 02/03/2013   Zoster, Live 08/22/2015    TDAP status: Due, Education has been provided regarding the  importance of this vaccine. Advised may receive this vaccine at local pharmacy or Health Dept. Aware to provide a copy of the vaccination record if obtained from local pharmacy or Health Dept. Verbalized acceptance and understanding.  Flu Vaccine status: Declined, Education has been provided regarding the importance of this vaccine but patient still declined. Advised may receive this vaccine at local pharmacy or Health Dept. Aware to provide a copy of the vaccination record if obtained from local pharmacy or Health Dept. Verbalized acceptance and understanding.  Pneumococcal vaccine status: Up to date  Covid-19 vaccine status: Completed vaccines  Qualifies for Shingles Vaccine? Yes   Zostavax completed Yes   Shingrix Completed?: No.    Education has been provided regarding the importance of this vaccine. Patient has been advised to call insurance company to determine out of pocket expense if they have not yet received this vaccine. Advised may also receive vaccine at local pharmacy or Health Dept. Verbalized acceptance and understanding.  Screening Tests Health Maintenance  Topic Date Due   Zoster Vaccines- Shingrix (1 of 2) 03/21/1970   Diabetic kidney evaluation - Urine ACR  07/25/2016   HEMOGLOBIN A1C  05/13/2019   Pneumonia Vaccine 24+ Years old (3 of 3 - PPSV23 or PCV20) 07/21/2019   Diabetic kidney evaluation - eGFR measurement  11/21/2019   COVID-19 Vaccine (3 - Moderna risk series) 03/24/2020   FOOT EXAM  08/05/2022   DTaP/Tdap/Td (2 - Td or Tdap) 02/03/2023   INFLUENZA VACCINE  07/30/2023   OPHTHALMOLOGY EXAM  03/25/2024   Medicare Annual Wellness (AWV)  07/30/2024   Colonoscopy  11/17/2024   Hepatitis C Screening  Completed   HPV VACCINES  Aged Out    Health Maintenance  Health Maintenance Due  Topic Date Due   Zoster Vaccines- Shingrix (1 of 2) [birth year]21/1970   Diabetic kidney evaluation - Urine ACR  07/25/2016   HEMOGLOBIN A1C  05/13/2019   Pneumonia Vaccine 65+ Years  old (3 of 3 - PPSV23 or PCV20) 07/21/2019   Diabetic kidney evaluation - eGFR measurement  11/21/2019   COVID-19 Vaccine (3 - Moderna risk series) 03/24/2020   FOOT EXAM  08/05/2022   DTaP/Tdap/Td (2 - Td or Tdap) 02/03/2023   INFLUENZA VACCINE  07/30/2023    Colorectal cancer screening: Type of screening: Colonoscopy. Completed 11/18/2019. Repeat every 5 years  Lung Cancer Screening: (Low Dose CT Chest recommended if Age 30-80 years, 20 pack-year currently smoking OR have quit w/in 15years.) does not qualify.   Lung Cancer Screening Referral: N/A  Additional Screening:  Hepatitis C Screening: does qualify; Completed 11/13/2017  Vision Screening: Recommended annual ophthalmology exams for early detection of glaucoma and other disorders of the eye. Is the patient up to date with their annual eye exam?  Yes  Who is the provider or what is the name of the office in which the patient attends annual eye exams? Dr. Louanna Raw If pt is not established with a provider, would they like to be referred to a provider to establish care? No .   Dental Screening: Recommended annual dental exams for proper oral hygiene  Diabetic Foot Exam:  Diabetic Foot Exam: Completed 1/  Community Resource Referral / Chronic Care Management: CRR required this visit?  No   CCM required this visit?  No     Plan:     I have personally reviewed and noted the following in the patient's chart:   Medical and social history Use of alcohol, tobacco or illicit drugs  Current medications and supplements including opioid prescriptions. Patient is not currently taking opioid prescriptions. Functional ability and status Nutritional status Physical activity Advanced directives List of other physicians Hospitalizations, surgeries, and ER visits in previous 12 months Vitals Screenings to include cognitive, depression, and falls Referrals and appointments  In addition, I have reviewed and discussed with patient certain  preventive protocols, quality metrics, and best practice recommendations. A written personalized care plan for preventive services as well as general preventive health recommendations were provided to patient.      L , CMA   07/31/2023   After Visit Summary: (MyChart) Due to this being a telephonic visit, the after visit summary with patients personalized plan was offered to patient via MyChart   Nurse Notes:Vaccinations: declines Influenza vaccine: recommend every Fall and Covid-19: recommend 2 doses one month apart with a booster 6 months later.  Patient is due for Tdap and stated he would have it done at his pharmacy.  Also due for Shingles vaccine: recommend Shingrix which is 2 doses 2-6 months apart and over 90% effective.  Patient stated that he would call office back to schedule AWV for next your.

## 2023-07-31 NOTE — Patient Instructions (Addendum)
Nathan Frazier , Thank you for taking time to come for your Medicare Wellness Visit. I appreciate your ongoing commitment to your health goals. Please review the following plan we discussed and let me know if I can assist you in the future.   Referrals/Orders/Follow-Ups/Clinician Recommendations: You are due for a Tetanus and Shingrix vaccines and can have them done at your local pharmacy.  Each day, aim for 6 glasses of water, plenty of protein in your diet and try to get up and walk/ stretch every hour for 5-10 minutes at a time.    This is a list of the screening recommended for you and due dates:  Health Maintenance  Topic Date Due   Zoster (Shingles) Vaccine (1 of 2) 03/21/1968   Yearly kidney health urinalysis for diabetes  07/25/2016   Hemoglobin A1C  05/13/2019   Pneumonia Vaccine (3 of 3 - PPSV23 or PCV20) 07/21/2019   Yearly kidney function blood test for diabetes  11/21/2019   COVID-19 Vaccine (3 - Moderna risk series) 03/24/2020   Complete foot exam   08/05/2022   DTaP/Tdap/Td vaccine (2 - Td or Tdap) 02/03/2023   Flu Shot  07/30/2023   Eye exam for diabetics  03/25/2024   Medicare Annual Wellness Visit  07/30/2024   Colon Cancer Screening  11/17/2024   Hepatitis C Screening  Completed   HPV Vaccine  Aged Out    Advanced directives: (Copy Requested) Please bring a copy of your health care power of attorney and living will to the office to be added to your chart at your convenience.  Next Medicare Annual Wellness Visit scheduled for next year: No  Preventive Care 11 Years and Older, Male  Preventive care refers to lifestyle choices and visits with your health care provider that can promote health and wellness. What does preventive care include? A yearly physical exam. This is also called an annual well check. Dental exams once or twice a year. Routine eye exams. Ask your health care provider how often you should have your eyes checked. Personal lifestyle choices,  including: Daily care of your teeth and gums. Regular physical activity. Eating a healthy diet. Avoiding tobacco and drug use. Limiting alcohol use. Practicing safe sex. Taking low doses of aspirin every day. Taking vitamin and mineral supplements as recommended by your health care provider. What happens during an annual well check? The services and screenings done by your health care provider during your annual well check will depend on your age, overall health, lifestyle risk factors, and family history of disease. Counseling  Your health care provider may ask you questions about your: Alcohol use. Tobacco use. Drug use. Emotional well-being. Home and relationship well-being. Sexual activity. Eating habits. History of falls. Memory and ability to understand (cognition). Work and work Astronomer. Screening  You may have the following tests or measurements: Height, weight, and BMI. Blood pressure. Lipid and cholesterol levels. These may be checked every 5 years, or more frequently if you are over 74 years old. Skin check. Lung cancer screening. You may have this screening every year starting at age 67 if you have a 30-pack-year history of smoking and currently smoke or have quit within the past 15 years. Fecal occult blood test (FOBT) of the stool. You may have this test every year starting at age 2. Flexible sigmoidoscopy or colonoscopy. You may have a sigmoidoscopy every 5 years or a colonoscopy every 10 years starting at age 25. Prostate cancer screening. Recommendations will vary depending on your family  history and other risks. Hepatitis C blood test. Hepatitis B blood test. Sexually transmitted disease (STD) testing. Diabetes screening. This is done by checking your blood sugar (glucose) after you have not eaten for a while (fasting). You may have this done every 1-3 years. Abdominal aortic aneurysm (AAA) screening. You may need this if you are a current or former  smoker. Osteoporosis. You may be screened starting at age 39 if you are at high risk. Talk with your health care provider about your test results, treatment options, and if necessary, the need for more tests. Vaccines  Your health care provider may recommend certain vaccines, such as: Influenza vaccine. This is recommended every year. Tetanus, diphtheria, and acellular pertussis (Tdap, Td) vaccine. You may need a Td booster every 10 years. Zoster vaccine. You may need this after age 27. Pneumococcal 13-valent conjugate (PCV13) vaccine. One dose is recommended after age 24. Pneumococcal polysaccharide (PPSV23) vaccine. One dose is recommended after age 71. Talk to your health care provider about which screenings and vaccines you need and how often you need them. This information is not intended to replace advice given to you by your health care provider. Make sure you discuss any questions you have with your health care provider. Document Released: 01/11/2016 Document Revised: 09/03/2016 Document Reviewed: 10/16/2015 Elsevier Interactive Patient Education  2017 [employer].  Fall Prevention in the Home Falls can cause injuries. They can happen to people of all ages. There are many things you can do to make your home safe and to help prevent falls. What can I do on the outside of my home? Regularly fix the edges of walkways and driveways and fix any cracks. Remove anything that might make you trip as you walk through a door, such as a raised step or threshold. Trim any bushes or trees on the path to your home. Use bright outdoor lighting. Clear any walking paths of anything that might make someone trip, such as rocks or tools. Regularly check to see if handrails are loose or broken. Make sure that both sides of any steps have handrails. Any raised decks and porches should have guardrails on the edges. Have any leaves, snow, or ice cleared regularly. Use sand or salt on walking paths during  winter. Clean up any spills in your garage right away. This includes oil or grease spills. What can I do in the bathroom? Use night lights. Install grab bars by the toilet and in the tub and shower. Do not use towel bars as grab bars. Use non-skid mats or decals in the tub or shower. If you need to sit down in the shower, use a plastic, non-slip stool. Keep the floor dry. Clean up any water that spills on the floor as soon as it happens. Remove soap buildup in the tub or shower regularly. Attach bath mats securely with double-sided non-slip rug tape. Do not have throw rugs and other things on the floor that can make you trip. What can I do in the bedroom? Use night lights. Make sure that you have a light by your bed that is easy to reach. Do not use any sheets or blankets that are too big for your bed. They should not hang down onto the floor. Have a firm chair that has side arms. You can use this for support while you get dressed. Do not have throw rugs and other things on the floor that can make you trip. What can I do in the kitchen? Clean up any  spills right away. Avoid walking on wet floors. Keep items that you use a lot in easy-to-reach places. If you need to reach something above you, use a strong step stool that has a grab bar. Keep electrical cords out of the way. Do not use floor polish or wax that makes floors slippery. If you must use wax, use non-skid floor wax. Do not have throw rugs and other things on the floor that can make you trip. What can I do with my stairs? Do not leave any items on the stairs. Make sure that there are handrails on both sides of the stairs and use them. Fix handrails that are broken or loose. Make sure that handrails are as long as the stairways. Check any carpeting to make sure that it is firmly attached to the stairs. Fix any carpet that is loose or worn. Avoid having throw rugs at the top or bottom of the stairs. If you do have throw rugs,  attach them to the floor with carpet tape. Make sure that you have a light switch at the top of the stairs and the bottom of the stairs. If you do not have them, ask someone to add them for you. What else can I do to help prevent falls? Wear shoes that: Do not have high heels. Have rubber bottoms. Are comfortable and fit you well. Are closed at the toe. Do not wear sandals. If you use a stepladder: Make sure that it is fully opened. Do not climb a closed stepladder. Make sure that both sides of the stepladder are locked into place. Ask someone to hold it for you, if possible. Clearly mark and make sure that you can see: Any grab bars or handrails. First and last steps. Where the edge of each step is. Use tools that help you move around (mobility aids) if they are needed. These include: Canes. Walkers. Scooters. Crutches. Turn on the lights when you go into a dark area. Replace any light bulbs as soon as they burn out. Set up your furniture so you have a clear path. Avoid moving your furniture around. If any of your floors are uneven, fix them. If there are any pets around you, be aware of where they are. Review your medicines with your doctor. Some medicines can make you feel dizzy. This can increase your chance of falling. Ask your doctor what other things that you can do to help prevent falls. This information is not intended to replace advice given to you by your health care provider. Make sure you discuss any questions you have with your health care provider. Document Released: 10/11/2009 Document Revised: 05/22/2016 Document Reviewed: 01/19/2015 Elsevier Interactive Patient Education  2017 [employer].

## 2023-12-29 ENCOUNTER — Ambulatory Visit: Payer: Medicare Other | Admitting: Internal Medicine

## 2023-12-31 ENCOUNTER — Ambulatory Visit (INDEPENDENT_AMBULATORY_CARE_PROVIDER_SITE_OTHER): Payer: Medicare Other | Admitting: Internal Medicine

## 2023-12-31 ENCOUNTER — Encounter: Payer: Self-pay | Admitting: Internal Medicine

## 2023-12-31 ENCOUNTER — Ambulatory Visit (HOSPITAL_COMMUNITY)
Admission: RE | Admit: 2023-12-31 | Discharge: 2023-12-31 | Disposition: A | Discharge status: Home / Self Care | Payer: Medicare Other | Source: Ambulatory Visit | Attending: Internal Medicine | Admitting: Internal Medicine

## 2023-12-31 VITALS — BP 150/80 | HR 59 | Temp 98.1°F | Wt 170.0 lb

## 2023-12-31 DIAGNOSIS — R0789 Other chest pain: Secondary | ICD-10-CM

## 2023-12-31 DIAGNOSIS — E785 Hyperlipidemia, unspecified: Secondary | ICD-10-CM

## 2023-12-31 DIAGNOSIS — I10 Essential (primary) hypertension: Secondary | ICD-10-CM | POA: Diagnosis not present

## 2023-12-31 DIAGNOSIS — E118 Type 2 diabetes mellitus with unspecified complications: Secondary | ICD-10-CM

## 2023-12-31 DIAGNOSIS — R42 Dizziness and giddiness: Secondary | ICD-10-CM

## 2023-12-31 DIAGNOSIS — E1169 Type 2 diabetes mellitus with other specified complication: Secondary | ICD-10-CM

## 2023-12-31 LAB — COMPREHENSIVE METABOLIC PANEL
ALT: 20 U/L (ref 0–53)
AST: 17 U/L (ref 0–37)
Albumin: 4.7 g/dL (ref 3.5–5.2)
Alkaline Phosphatase: 66 U/L (ref 39–117)
BUN: 18 mg/dL (ref 6–23)
CO2: 30 meq/L (ref 19–32)
Calcium: 10.3 mg/dL (ref 8.4–10.5)
Chloride: 102 meq/L (ref 96–112)
Creatinine, Ser: 1.07 mg/dL (ref 0.40–1.50)
GFR: 68.23 mL/min (ref >60.00)
Glucose, Bld: 102 mg/dL — ABNORMAL HIGH (ref 70–99)
Potassium: 4.1 meq/L (ref 3.5–5.1)
Sodium: 140 meq/L (ref 135–145)
Total Bilirubin: 0.7 mg/dL (ref 0.2–1.2)
Total Protein: 7.5 g/dL (ref 6.0–8.3)

## 2023-12-31 LAB — CBC
HCT: 50.9 % (ref 39.0–52.0)
Hemoglobin: 16.7 g/dL (ref 13.0–17.0)
MCHC: 32.9 g/dL (ref 30.0–36.0)
MCV: 93.4 fL (ref 78.0–100.0)
Platelets: 174 10*3/uL (ref 150.0–400.0)
RBC: 5.45 Mil/uL (ref 4.22–5.81)
RDW: 14.2 % (ref 11.5–15.5)
WBC: 5.4 10*3/uL (ref 4.0–10.5)

## 2023-12-31 LAB — LIPID PANEL
Cholesterol: 164 mg/dL (ref 0–200)
HDL: 40.6 mg/dL (ref >39.00)
LDL Cholesterol: 99 mg/dL (ref 0–99)
NonHDL: 123.88
Total CHOL/HDL Ratio: 4
Triglycerides: 123 mg/dL (ref 0.0–149.0)
VLDL: 24.6 mg/dL (ref 0.0–40.0)

## 2023-12-31 LAB — MICROALBUMIN / CREATININE URINE RATIO
Creatinine,U: 66.4 mg/dL
Microalb Creat Ratio: 37.6 mg/g — ABNORMAL HIGH (ref 0.0–30.0)
Microalb, Ur: 25 mg/dL — ABNORMAL HIGH (ref 0.0–1.9)

## 2023-12-31 LAB — HEMOGLOBIN A1C: Hgb A1c MFr Bld: 6.7 % — ABNORMAL HIGH (ref 4.6–6.5)

## 2023-12-31 MED ORDER — TRIAMCINOLONE ACETONIDE 0.1 % EX CREA: TOPICAL_CREAM | Freq: Two times a day (BID) | CUTANEOUS | 0 refills | Status: AC

## 2023-12-31 MED ORDER — LISINOPRIL 20 MG PO TABS: 20.0000 mg | ORAL_TABLET | Freq: Every day | ORAL | 3 refills | Status: DC

## 2023-12-31 NOTE — Assessment & Plan Note (Signed)
 New in the last month more with change in position. I am concerned about plaque buildup in carotid arteries. Ordered VAS carotid US. Checking diabetes control, lipid panel, CMP, CBC today to exclude metabolic etiology. Symptoms are not typical for vertigo.

## 2023-12-31 NOTE — Assessment & Plan Note (Signed)
Checking lipid panel and adjust lipitor 40 mg daily as needed for goal LDL <70. ?

## 2023-12-31 NOTE — Assessment & Plan Note (Signed)
 Checking microalbumin to creatinine ratio, HgA1c, lipid panel, CMP. Counseled about eye exam yearly. Foot exam done. Adjust as needed his synjardy xr, actos, ozempic 0.25 mg weekly. He is on ACE-I and statin.

## 2023-12-31 NOTE — Assessment & Plan Note (Signed)
 We had reduced lisinopril to 20 mg daily from 40 mg daily due to lightheadedness but BP is mildly elevated today. Checking CMP and if needed can increase.

## 2023-12-31 NOTE — Assessment & Plan Note (Signed)
 EKG done today which is unchanged from prior. He suspects indigestion which is possible. We reviewed prior stress testing. He will change diet and if persistent we can consider calcium score. He is on statin currently and aspirin 81 mg daily.

## 2023-12-31 NOTE — Patient Instructions (Signed)
 The EKG is normal today and unchanged from before.  We will get the ultrasound of the carotid arteries to check for blockages and check the labs today.

## 2023-12-31 NOTE — Progress Notes (Signed)
   Subjective:   Patient ID: Nathan Frazier, male    DOB: 1951-08-28, 73 y.o.   MRN: [id number]  HPI The patient is a 73 YO man coming in for care (last seen 2.5 years ago). Was unable to get in with endo in a timely fashion so was advised to see us . He is having several new problems lately including rare chest pains (over the last few months, attributes to indigestion lasting <30 minutes) and dizziness (new in the last month started with middle of the night awakening then was gone when he awoke, is happening randomly more with standing from lying position, denies taking anything for it).   Review of Systems  Constitutional:  Positive for activity change.  HENT: Negative.    Eyes: Negative.   Respiratory:  Negative for cough, chest tightness and shortness of breath.   Cardiovascular:  Positive for chest pain. Negative for palpitations and leg swelling.  Gastrointestinal:  Negative for abdominal distention, abdominal pain, constipation, diarrhea, nausea and vomiting.  Musculoskeletal: Negative.   Skin: Negative.   Neurological:  Positive for dizziness.  Psychiatric/Behavioral: Negative.      Objective:  Physical Exam Constitutional:      Appearance: He is well-developed.  HENT:     Head: Normocephalic and atraumatic.  Neck:     Vascular: No carotid bruit.  Cardiovascular:     Rate and Rhythm: Normal rate and regular rhythm.  Pulmonary:     Effort: Pulmonary effort is normal. No respiratory distress.     Breath sounds: Normal breath sounds. No wheezing or rales.  Chest:     Chest wall: No tenderness.  Abdominal:     General: Bowel sounds are normal. There is no distension.     Palpations: Abdomen is soft.     Tenderness: There is no abdominal tenderness. There is no rebound.  Musculoskeletal:     Cervical back: Normal range of motion.  Skin:    General: Skin is warm and dry.     Comments: Foot exam done  Neurological:     Mental Status: He is alert and oriented to person,  place, and time.     Coordination: Coordination normal.     Vitals:   12/31/23 0827 12/31/23 0830  BP: (!) 150/80 (!) 150/80  Pulse: (!) 59   Temp: 98.1 F (36.7 C)   TempSrc: Oral   SpO2: 99%   Weight: 170 lb (77.1 kg)    EKG: Rate 53, axis normal, interval normal, sinus brady, no st or t wave changes, no significant change compared to prior 2019  Assessment & Plan:  Visit time 25 minutes in face to face communication with patient and coordination of care, additional 15 minutes spent in record review, coordination or care, ordering tests, communicating/referring to other healthcare professionals, documenting in medical records all on the same day of the visit for total time 40 minutes spent on the visit.

## 2024-01-22 ENCOUNTER — Ambulatory Visit: Payer: Medicare Other | Admitting: Family Medicine

## 2024-05-30 LAB — HM DIABETES EYE EXAM

## 2024-06-28 ENCOUNTER — Ambulatory Visit (INDEPENDENT_AMBULATORY_CARE_PROVIDER_SITE_OTHER): Admitting: Family Medicine

## 2024-06-28 ENCOUNTER — Encounter: Payer: Self-pay | Admitting: Family Medicine

## 2024-06-28 VITALS — BP 126/72 | HR 68 | Temp 97.4°F | Ht 68.0 in | Wt 168.6 lb

## 2024-06-28 DIAGNOSIS — I10 Essential (primary) hypertension: Secondary | ICD-10-CM

## 2024-06-28 DIAGNOSIS — E118 Type 2 diabetes mellitus with unspecified complications: Secondary | ICD-10-CM | POA: Diagnosis not present

## 2024-06-28 DIAGNOSIS — R42 Dizziness and giddiness: Secondary | ICD-10-CM

## 2024-06-28 NOTE — Patient Instructions (Addendum)
 Continue with lisinopril  for now.  Check BP at home once daily in the mornings for the next month, especially when you are feeling dizzy.  Follow up with us  in a month to recheck blood pressures.

## 2024-06-28 NOTE — Assessment & Plan Note (Signed)
 Continue current medications Continue to check blood sugars daily and with episodes of dizziness

## 2024-06-28 NOTE — Assessment & Plan Note (Signed)
 Continue to check blood sugars at home. Make sure that you are staying well hydrated.  Check BP at home once daily in the mornings for the next month.  Follow up with in a month to recheck blood pressures.

## 2024-06-28 NOTE — Progress Notes (Signed)
 Established Patient Office Visit  Subjective   Patient ID: Nathan Frazier, male    DOB: [birth year]  Age: 73 y.o. MRN: [id number]  Chief Complaint  Patient presents with   Follow-up    HPI Patient presents today for medication management and follow-up of hypertension. Reports mild dizziness, worse with position changes. Denies LOC, head injury, fall.  Reports an episode of dizziness that woke him up in the middle of the night a few months ago, but none since.  Reports compliance with medication regimen. Does not check blood pressures at home.  Did not check blood sugars with episodes of dizziness. Denies the need for refills. Not fasting today. Denies other concerns. Medical history as outlined below.  ROS Per HPI    Objective:     BP 126/72 (BP Location: Right Arm, Patient Position: Sitting, Cuff Size: Normal)   Pulse 68   Temp (!) 97.4 F (36.3 C) (Temporal)   Ht 5' 8 (1.727 m)   Wt 168 lb 9.6 oz (76.5 kg)   SpO2 98%   BMI 25.64 kg/m   Physical Exam Vitals and nursing note reviewed.  Constitutional:      General: He is not in acute distress.    Appearance: Normal appearance.  HENT:     Head: Normocephalic and atraumatic.     Right Ear: External ear normal.     Left Ear: External ear normal.     Nose: Nose normal.     Mouth/Throat:     Mouth: Mucous membranes are moist.     Pharynx: Oropharynx is clear.   Eyes:     Extraocular Movements: Extraocular movements intact.    Cardiovascular:     Rate and Rhythm: Normal rate and regular rhythm.     Pulses: Normal pulses.     Heart sounds: Normal heart sounds.  Pulmonary:     Effort: Pulmonary effort is normal. No respiratory distress.     Breath sounds: Normal breath sounds. No wheezing, rhonchi or rales.   Musculoskeletal:        General: Normal range of motion.     Cervical back: Normal range of motion.     Right lower leg: No edema.     Left lower leg: No edema.  Lymphadenopathy:     Cervical: No  cervical adenopathy.   Skin:    General: Skin is warm and dry.   Neurological:     General: No focal deficit present.     Mental Status: He is alert and oriented to person, place, and time.   Psychiatric:        Mood and Affect: Mood normal.        Behavior: Behavior normal.      No results found for any visits on 06/28/24.   The 10-year ASCVD risk score (Arnett DK, et al., 2019) is: 46.5%    Assessment & Plan:   Essential hypertension Assessment & Plan: Check blood pressures at home over the next 2 weeks once daily and when you are having episodes of dizziness Continue lisinopril    Controlled type 2 diabetes mellitus with complication, without long-term current use of insulin (HCC) Assessment & Plan: Continue current medications Continue to check blood sugars daily and with episodes of dizziness   Dizziness Assessment & Plan: Continue to check blood sugars at home. Make sure that you are staying well hydrated.  Check BP at home once daily in the mornings for the next month.  Follow up with in a month  to recheck blood pressures.       Return for 2-3 weeks for recheck of dizziness, BP, blood sugars.    Corean LITTIE Ku, FNP

## 2024-06-28 NOTE — Assessment & Plan Note (Signed)
 Check blood pressures at home over the next 2 weeks once daily and when you are having episodes of dizziness Continue lisinopril 

## 2024-08-09 ENCOUNTER — Ambulatory Visit

## 2024-08-09 VITALS — Ht 68.0 in | Wt 168.0 lb

## 2024-08-09 DIAGNOSIS — Z0000 Encounter for general adult medical examination without abnormal findings: Secondary | ICD-10-CM

## 2024-08-09 NOTE — Progress Notes (Signed)
 Subjective:   Nathan Frazier is a 73 y.o. who presents for a Medicare Wellness preventive visit.  As a reminder, Annual Wellness Visits don't include a physical exam, and some assessments may be limited, especially if this visit is performed virtually. We may recommend an in-person follow-up visit with your provider if needed.  Visit Complete: Virtual I connected with  Nathan Frazier on 08/09/24 by a audio enabled telemedicine application and verified that I am speaking with the correct person using two identifiers.  Patient Location: Home  Provider Location: Home Office  I discussed the limitations of evaluation and management by telemedicine. The patient expressed understanding and agreed to proceed.  Vital Signs: Because this visit was a virtual/telehealth visit, some criteria may be missing or patient reported. Any vitals not documented were not able to be obtained and vitals that have been documented are patient reported.  VideoDeclined- This patient declined Librarian, academic. Therefore the visit was completed with audio only.  Persons Participating in Visit: Patient.  AWV Questionnaire: Yes: Patient Medicare AWV questionnaire was completed by the patient on 08/08/2024; I have confirmed that all information answered by patient is correct and no changes since this date.  Cardiac Risk Factors include: advanced age (>50men, >52 women);hypertension;diabetes mellitus;male gender     Objective:    Today's Vitals   08/09/24 1037  Weight: 168 lb (76.2 kg)  Height: 5' 8 (1.727 m)   Body mass index is 25.54 kg/m.     08/09/2024   10:48 AM 07/31/2023    2:32 PM 08/29/2022   11:51 AM 11/19/2018    5:40 PM 11/12/2018    9:16 AM 11/13/2017    4:21 PM 09/15/2014   10:33 AM  Advanced Directives  Does Patient Have a Medical Advance Directive? Yes Yes Yes No  No  No  No   Type of Estate agent of [employer] Power of  [place];Living will Healthcare Power of [place];Living will      Copy of Healthcare Power of Attorney in Chart? No - copy requested No - copy requested No - copy requested      Would patient like information on creating a medical advance directive?    No - Patient declined  No - Patient declined  No - Patient declined       Data saved with a previous flowsheet row definition    Current Medications (verified) Outpatient Encounter Medications as of 08/09/2024  Medication Sig   acetaminophen  (TYLENOL ) 500 MG tablet Take 1,000 mg by mouth daily as needed for moderate pain or headache.   aspirin  81 MG chewable tablet Chew 1 tablet (81 mg total) by mouth 2 (two) times daily. (Patient taking differently: Chew 81 mg by mouth once.)   atorvastatin  (LIPITOR) 40 MG tablet Take 40 mg by mouth daily.   b complex vitamins tablet Take 1 tablet by mouth daily.   Emollient (CETAPHIL) cream Apply 1 application topically as needed (dry skin).   Empagliflozin-metFORMIN  HCl ER (SYNJARDY XR) 12.04-999 MG TB24 Take 2 tablets by mouth daily.   gabapentin  (NEURONTIN ) 300 MG capsule Take 1 capsule (300 mg total) by mouth at bedtime.   lisinopril  (ZESTRIL ) 40 MG tablet Take 40 mg by mouth daily.   methocarbamol  (ROBAXIN ) 500 MG tablet Take 1 tablet (500 mg total) by mouth every 6 (six) hours as needed for muscle spasms.   Multiple Vitamin (MULTIVITAMIN WITH MINERALS) TABS tablet Take 1 tablet by mouth daily.   pioglitazone  (  ACTOS ) 15 MG tablet Take 15 mg by mouth daily.   Semaglutide,0.25 or 0.5MG /DOS, (OZEMPIC, 0.25 OR 0.5 MG/DOSE,) 2 MG/1.5ML SOPN Inject 0.5 mg into the skin once a week.   triamcinolone  cream (KENALOG ) 0.1 % Apply topically 2 (two) times daily.   [DISCONTINUED] lisinopril  (ZESTRIL ) 20 MG tablet Take 1 tablet (20 mg total) by mouth daily.   No facility-administered encounter medications on file as of 08/09/2024.    Allergies (verified) Metformin  and related and Pravastatin    History: Past  Medical History:  Diagnosis Date   Allergic rhinitis    Allergy    Arthritis    Familial tremor    Fracture    right thumb, right elbow   GERD (gastroesophageal reflux disease)    Other and unspecified hyperlipidemia    Special screening for malignant neoplasms, unspecified intestine    Type II or unspecified type diabetes mellitus without mention of complication, not stated as uncontrolled    Unspecified essential hypertension    Past Surgical History:  Procedure Laterality Date   THYROID  CYST EXCISION  1983   TOTAL HIP ARTHROPLASTY Left 11/19/2018   Procedure: LEFT TOTAL HIP ARTHROPLASTY ANTERIOR APPROACH;  Surgeon: Vernetta Lonni GRADE, MD;  Location: [facility];  Service: Orthopedics;  Laterality: Left;  Failed Spinal   Family History  Problem Relation Age of Onset   Heart disease Maternal Grandfather    Liver cancer Mother    Colon cancer Neg Hx    Esophageal cancer Neg Hx    Rectal cancer Neg Hx    Stomach cancer Neg Hx    Social History   Socioeconomic History   Marital status: Married    Spouse name: Pam Conaty   Number of children: 2   Years of education: Not on file   Highest education level: Professional school degree (e.g., MD, DDS, DVM, JD)  Occupational History   Occupation: Visual merchandiser company  Tobacco Use   Smoking status: Never   Smokeless tobacco: Never  Vaping Use   Vaping status: Never Used  Substance and Sexual Activity   Alcohol use: Yes    Alcohol/week: 1.0 standard drink of alcohol    Types: 1 Standard drinks or equivalent per week   Drug use: No   Sexual activity: Yes    Partners: Female  Other Topics Concern   Not on file  Social History Narrative   [place] exercise: seldomCaffeine use: coffee daily   Lives with wife and daughter/2025   Social Drivers of Health   Financial Resource Strain: Low Risk  (08/08/2024)   Overall Financial Resource Strain (CARDIA)    Difficulty of Paying  Living Expenses: Not very hard  Food Insecurity: No Food Insecurity (08/08/2024)   Hunger Vital Sign    Worried About Running Out of Food in the Last Year: Never true    Ran Out of Food in the Last Year: Never true  Transportation Needs: No Transportation Needs (08/08/2024)   PRAPARE - Administrator, Civil Service (Medical): No    Lack of Transportation (Non-Medical): No  Physical Activity: Insufficiently Active (08/08/2024)   Exercise Vital Sign    Days of Exercise per Week: 1 day    Minutes of Exercise per Session: 10 min  Stress: No Stress Concern Present (08/08/2024)   [employer] of Occupational Health - Occupational Stress Questionnaire    Feeling of Stress: Only a little  Social Connections: Moderately Isolated (08/08/2024)   Social Connection and Isolation Panel  Frequency of Communication with Friends and Family: More than three times a week    Frequency of Social Gatherings with Friends and Family: Never    Attends Religious Services: Never    Database administrator or Organizations: No    Attends Engineer, structural: Not on file    Marital Status: Married    Tobacco Counseling Counseling given: Not Answered    Clinical Intake:  Pre-visit preparation completed: Yes  Pain : No/denies pain     BMI - recorded: 25.54 Nutritional Status: BMI 25 -29 Overweight Nutritional Risks: None Diabetes: Yes CBG done?: Yes (127-fasting-per pt) CBG resulted in Enter/ Edit results?: No Did pt. bring in CBG monitor from home?: No  Lab Results  Component Value Date   HGBA1C 6.7 (H) 12/31/2023   HGBA1C 6.4 (H) 11/12/2018   HGBA1C 6.9 12/10/2017     How often do you need to have someone help you when you read instructions, pamphlets, or other written materials from your doctor or pharmacy?: 1 - Never  Interpreter Needed?: No  Information entered by :: Saheed Carrington, RMA   Activities of Daily Living     08/08/2024   10:44 AM  In your present  state of health, do you have any difficulty performing the following activities:  Hearing? 0  Vision? 1  Difficulty concentrating or making decisions? 1  Walking or climbing stairs? 0  Dressing or bathing? 0  Doing errands, shopping? 0  Preparing Food and eating ? N  Using the Toilet? N  In the past six months, have you accidently leaked urine? N  Do you have problems with loss of bowel control? N  Managing your Medications? N  Managing your Finances? N  Housekeeping or managing your Housekeeping? N    Patient Care Team: Rollene Almarie LABOR, MD as PCP - General (Internal Medicine) [place], Heather, OD (Optometry) Szabat, Toribio BROCKS, [facility] (Inactive) (Pharmacist)  I have updated your Care Teams any recent Medical Services you may have received from other providers in the past year.     Assessment:   This is a routine wellness examination for [place].  Hearing/Vision screen Hearing Screening - Comments:: Denies hearing difficulties     Goals Addressed   None    Depression Screen     08/09/2024   10:48 AM 12/31/2023    8:31 AM 07/31/2023    2:33 PM 08/29/2022   11:47 AM 08/10/2020    1:21 PM 11/13/2017    4:21 PM 09/11/2016    3:31 PM  PHQ 2/9 Scores  PHQ - 2 Score 0 0 0 0 0 0 0  PHQ- 9 Score 0 0 0   0     Fall Risk     08/08/2024   10:44 AM 12/31/2023    8:31 AM 07/31/2023    2:32 PM 07/27/2023   12:21 PM 08/29/2022   11:44 AM  Fall Risk   Falls in the past year? 0 0 0 0 0  Number falls in past yr: 0 0  0 0  Injury with Fall? 0 0  0 0  Risk for fall due to :     No Fall Risks  Follow up Falls evaluation completed;Falls prevention discussed Falls evaluation completed   Falls prevention discussed      Data saved with a previous flowsheet row definition    MEDICARE RISK AT HOME:  Medicare Risk at Home Any stairs in or around the home?: (Patient-Rptd) Yes If so, are there  any without handrails?: (Patient-Rptd) No Home free of loose throw rugs in walkways, pet beds, electrical  cords, etc?: (Patient-Rptd) No Adequate lighting in your home to reduce risk of falls?: (Patient-Rptd) Yes Life alert?: (Patient-Rptd) No Use of a cane, walker or w/c?: (Patient-Rptd) No Grab bars in the bathroom?: (Patient-Rptd) Yes Shower chair or bench in shower?: (Patient-Rptd) Yes Elevated toilet seat or a handicapped toilet?: (Patient-Rptd) No  TIMED UP AND GO:  Was the test performed?  No  Cognitive Function: Declined/Normal: No cognitive concerns noted by patient or family. Patient alert, oriented, able to answer questions appropriately and recall recent events. No signs of memory loss or confusion.    11/13/2017    4:25 PM  MMSE - Mini Mental State Exam  Orientation to time 5   Orientation to Place 5   Registration 3   Attention/ Calculation 5   Recall 2   Language- name 2 objects 2   Language- repeat 1  Language- follow 3 step command 3   Language- read & follow direction 1   Write a sentence 1   Copy design 1   Total score 29      Data saved with a previous flowsheet row definition        07/31/2023    2:48 PM 08/29/2022   11:53 AM  6CIT Screen  What Year? 0 points 0 points  What month? 0 points 0 points  What time? 0 points 0 points  Count back from 20 0 points 0 points  Months in reverse 0 points 0 points  Repeat phrase 0 points 0 points  Total Score 0 points 0 points    Immunizations Immunization History  Administered Date(s) Administered   Influenza, High Dose Seasonal PF 11/13/2017   Influenza-Unspecified 09/20/2018   Moderna Sars-Covid-2 Vaccination 01/28/2020, 02/25/2020   Pneumococcal Conjugate-13 07/20/2014   Pneumococcal Polysaccharide-23 02/03/2013   Tdap 02/03/2013   Zoster, Live 08/22/2015    Screening Tests Health Maintenance  Topic Date Due   Diabetic kidney evaluation - Urine ACR  Never done   Zoster Vaccines- Shingrix (1 of 2) 03/21/1968   Pneumococcal Vaccine: 50+ Years (3 of 3 - PCV20 or PCV21) 07/21/2019   COVID-19 Vaccine (3  - Moderna risk series) 03/24/2020   DTaP/Tdap/Td (2 - Td or Tdap) 02/03/2023   HEMOGLOBIN A1C  06/29/2024   Medicare Annual Wellness (AWV)  07/30/2024   INFLUENZA VACCINE  07/29/2024   Colonoscopy  11/17/2024   Diabetic kidney evaluation - eGFR measurement  12/30/2024   FOOT EXAM  12/30/2024   OPHTHALMOLOGY EXAM  05/30/2025   Hepatitis C Screening  Completed   Hepatitis B Vaccines  Aged Out   HPV VACCINES  Aged Out   Meningococcal B Vaccine  Aged Out    Health Maintenance  Health Maintenance Due  Topic Date Due   Diabetic kidney evaluation - Urine ACR  Never done   Zoster Vaccines- Shingrix (1 of 2) 03/21/1968   Pneumococcal Vaccine: 50+ Years (3 of 3 - PCV20 or PCV21) 07/21/2019   COVID-19 Vaccine (3 - Moderna risk series) 03/24/2020   DTaP/Tdap/Td (2 - Td or Tdap) 02/03/2023   HEMOGLOBIN A1C  06/29/2024   Medicare Annual Wellness (AWV)  07/30/2024   INFLUENZA VACCINE  07/29/2024   Colonoscopy  11/17/2024   Health Maintenance Items Addressed: Labs Ordered: UACR, See Nurse Notes at the end of this note  Additional Screening:  Vision Screening: Recommended annual ophthalmology exams for early detection of glaucoma and other disorders of  the eye. Would you like a referral to an eye doctor? No    Dental Screening: Recommended annual dental exams for proper oral hygiene  Community Resource Referral / Chronic Care Management: CRR required this visit?  No   CCM required this visit?  No   Plan:    I have personally reviewed and noted the following in the patient's chart:   Medical and social history Use of alcohol, tobacco or illicit drugs  Current medications and supplements including opioid prescriptions. Patient is not currently taking opioid prescriptions. Functional ability and status Nutritional status Physical activity Advanced directives List of other physicians Hospitalizations, surgeries, and ER visits in previous 12 months Vitals Screenings to include  cognitive, depression, and falls Referrals and appointments  In addition, I have reviewed and discussed with patient certain preventive protocols, quality metrics, and best practice recommendations. A written personalized care plan for preventive services as well as general preventive health recommendations were provided to patient.   Cartha Rotert L Chee Kinslow, CMA   08/09/2024   After Visit Summary: (MyChart) Due to this being a telephonic visit, the after visit summary with patients personalized plan was offered to patient via MyChart   Notes: Patient is due for a Tdap.  He declines pneumonia, flu and Shingrix vaccines.  Patient is also due for an A1C check and a UACR, which order has been placed today. He is requesting a refill on Pioglitazone  15mg  and on the Lisinopril  40 mg x90 days.  Patient will also be due for a colonoscopy in Nov 2025.  He had no other concerns to address today.

## 2024-08-09 NOTE — Patient Instructions (Signed)
 Mr. Nathan Frazier , Thank you for taking time out of your busy schedule to complete your Annual Wellness Visit with me. I enjoyed our conversation and look forward to speaking with you again next year. I, as well as your care team,  appreciate your ongoing commitment to your health goals. Please review the following plan we discussed and let me know if I can assist you in the future. Your Game plan/ To Do List    Referrals: If you haven't heard from the office you've been referred to, please reach out to them at the phone provided.   Follow up Visits: We will see or speak with you next year for your Next Medicare AWV with our clinical staff Have you seen your provider in the last 6 months (3 months if uncontrolled diabetes)? Yes.  Next OV on 08/10/2024.  Clinician Recommendations:  Aim for 30 minutes of exercise or brisk walking, 6-8 glasses of water , and 5 servings of fruits and vegetables each day. You are due for a Tetanus vaccine and can get that done at your local pharmacy.  You are also due for a kidney evaluation and a A1C check, which will be done during your next office visit with Dr. Rollene.  I have sent a request to your provider about medication refills today.       This is a list of the screenings recommended for you:  Health Maintenance  Topic Date Due   Yearly kidney health urinalysis for diabetes  Never done   Zoster (Shingles) Vaccine (1 of 2) 03/21/1968   Pneumococcal Vaccine for age over 83 (3 of 3 - PCV20 or PCV21) 07/21/2019   COVID-19 Vaccine (3 - Moderna risk series) 03/24/2020   DTaP/Tdap/Td vaccine (2 - Td or Tdap) 02/03/2023   Hemoglobin A1C  06/29/2024   Medicare Annual Wellness Visit  07/30/2024   Flu Shot  07/29/2024   Colon Cancer Screening  11/17/2024   Yearly kidney function blood test for diabetes  12/30/2024   Complete foot exam   12/30/2024   Eye exam for diabetics  05/30/2025   Hepatitis C Screening  Completed   Hepatitis B Vaccine  Aged Out   HPV Vaccine   Aged Out   Meningitis B Vaccine  Aged Out    Advanced directives: (Copy Requested) Please bring a copy of your health care power of attorney and living will to the office to be added to your chart at your convenience. You can mail to [facility] [address] [place], [place] [address] or email to [email or web address] Advance Care Planning is important because it:  [x]  Makes sure you receive the medical care that is consistent with your values, goals, and preferences  [x]  It provides guidance to your family and loved ones and reduces their decisional burden about whether or not they are making the right decisions based on your wishes.  Follow the link provided in your after visit summary or read over the paperwork we have mailed to you to help you started getting your Advance Directives in place. If you need assistance in completing these, please reach out to us  so that we can help you!  See attachments for Preventive Care and Fall Prevention Tips.

## 2024-08-10 ENCOUNTER — Ambulatory Visit (INDEPENDENT_AMBULATORY_CARE_PROVIDER_SITE_OTHER): Admitting: Internal Medicine

## 2024-08-10 ENCOUNTER — Encounter: Payer: Self-pay | Admitting: Internal Medicine

## 2024-08-10 VITALS — BP 110/66 | HR 66 | Temp 97.9°F | Ht 68.0 in | Wt 168.6 lb

## 2024-08-10 DIAGNOSIS — E118 Type 2 diabetes mellitus with unspecified complications: Secondary | ICD-10-CM

## 2024-08-10 DIAGNOSIS — I10 Essential (primary) hypertension: Secondary | ICD-10-CM

## 2024-08-10 MED ORDER — ATORVASTATIN CALCIUM 40 MG PO TABS: 40.0000 mg | ORAL_TABLET | Freq: Every day | ORAL | 3 refills | Status: AC

## 2024-08-10 MED ORDER — LISINOPRIL 40 MG PO TABS: 40.0000 mg | ORAL_TABLET | Freq: Every day | ORAL | 3 refills | Status: AC

## 2024-08-10 NOTE — Patient Instructions (Signed)
 f

## 2024-08-10 NOTE — Progress Notes (Signed)
   Subjective:   Patient ID: Nathan Frazier, male    DOB: October 14, 1951, 73 y.o.   MRN: [id number]  Discussed the use of AI scribe software for clinical note transcription with the patient, who gave verbal consent to proceed.  History of Present Illness Nathan Frazier is a 73 year old male who presents for follow-up regarding previous dizzy spells.  He experienced a single episode of dizziness approximately one month ago, described as 'head dizziness' rather than vertigo. This occurred while sleeping, with no similar episodes since. He considered low blood sugar as a cause but noted it felt different from previous hypoglycemic episodes.  He has a history of high blood pressure and is currently on lisinopril , recently increased from 20 mg to 40 mg. He monitors his blood pressure at home. He recalls a past inner ear infection in 1995 and a history of concussions, possibly from playing football. 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  No new headaches, ear troubles, chest pains, tightness, pressure, or breathing troubles. He purchased an adjustable bed, which has alleviated back issues previously worsened by sleeping in a recliner due to nasal congestion. He uses a technique to realign his back in the morning, effectively managing symptoms.  Review of Systems  Constitutional: Negative.   HENT: Negative.    Eyes: Negative.   Respiratory:  Negative for cough, chest tightness and shortness of breath.   Cardiovascular:  Negative for chest pain, palpitations and leg swelling.  Gastrointestinal:  Negative for abdominal distention, abdominal pain, constipation, diarrhea, nausea and vomiting.  Musculoskeletal: Negative.   Skin: Negative.   Neurological: Negative.   Psychiatric/Behavioral: Negative.      Objective:  Physical Exam Constitutional:      Appearance: He is well-developed.  HENT:     Head: Normocephalic and atraumatic.  Cardiovascular:     Rate and  Rhythm: Normal rate and regular rhythm.  Pulmonary:     Effort: Pulmonary effort is normal. No respiratory distress.     Breath sounds: Normal breath sounds. No wheezing or rales.  Abdominal:     General: Bowel sounds are normal. There is no distension.     Palpations: Abdomen is soft.     Tenderness: There is no abdominal tenderness. There is no rebound.  Musculoskeletal:     Cervical back: Normal range of motion.  Skin:    General: Skin is warm and dry.  Neurological:     Mental Status: He is alert and oriented to person, place, and time.     Coordination: Coordination normal.     Vitals:   08/10/24 1056  BP: 110/66  Pulse: 66  Temp: 97.9 F (36.6 C)  TempSrc: Oral  SpO2: 97%  Weight: 168 lb 9.6 oz (76.5 kg)  Height: 5' 8 (1.727 m)    Assessment and Plan Assessment & Plan Dizziness (resolved)   Dizziness was a one-time event and not vertigo. Low blood sugar is unlikely. No recurrence reported.  Hypertension   Blood pressure is controlled at 110/66 mmHg with increased lisinopril . The target is 120s/70s. Continue lisinopril  40 mg daily.  Hyperlipidemia   Discussed the need for atorvastatin  refill. Refill atorvastatin  prescription.

## 2024-08-11 ENCOUNTER — Encounter: Payer: Self-pay | Admitting: Internal Medicine

## 2024-08-11 NOTE — Assessment & Plan Note (Signed)
 BP at goal on lisinopril  40 mg daily and BMP stable after dose change.

## 2024-08-11 NOTE — Assessment & Plan Note (Signed)
 Reviewed recent labs and controlled at goal.

## 2025-08-10 ENCOUNTER — Ambulatory Visit
